# Patient Record
Sex: Female | Born: 1980 | Race: Black or African American | Hispanic: No | Marital: Single | Smoking: Current every day smoker
Health system: Southern US, Community
[De-identification: ages and names within clinical notes are randomized; demographics above are authoritative.]

## PROBLEM LIST (undated history)

## (undated) DIAGNOSIS — M5430 Sciatica, unspecified side: Secondary | ICD-10-CM

## (undated) DIAGNOSIS — F4310 Post-traumatic stress disorder, unspecified: Secondary | ICD-10-CM

## (undated) DIAGNOSIS — J45909 Unspecified asthma, uncomplicated: Secondary | ICD-10-CM

## (undated) DIAGNOSIS — F32A Depression, unspecified: Secondary | ICD-10-CM

## (undated) HISTORY — DX: Depression, unspecified: F32.A

## (undated) HISTORY — DX: Post-traumatic stress disorder, unspecified: F43.10

---
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
## 2009-06-12 DIAGNOSIS — M5430 Sciatica, unspecified side: Secondary | ICD-10-CM

## 2009-06-12 HISTORY — DX: Sciatica, unspecified side: M54.30

## 2011-02-23 ENCOUNTER — Emergency Department (HOSPITAL_COMMUNITY)
Admission: EM | Admit: 2011-02-23 | Discharge: 2011-02-23 | Disposition: A | Discharge status: Home / Self Care | Payer: Medicaid Other | Attending: Emergency Medicine | Admitting: Emergency Medicine

## 2011-02-23 DIAGNOSIS — R51 Headache: Secondary | ICD-10-CM | POA: Insufficient documentation

## 2012-04-25 ENCOUNTER — Emergency Department (HOSPITAL_COMMUNITY)
Admission: EM | Admit: 2012-04-25 | Discharge: 2012-04-25 | Disposition: A | Discharge status: Home / Self Care | Payer: Medicaid Other | Attending: Emergency Medicine | Admitting: Emergency Medicine

## 2012-04-25 DIAGNOSIS — K089 Disorder of teeth and supporting structures, unspecified: Secondary | ICD-10-CM | POA: Insufficient documentation

## 2012-04-25 DIAGNOSIS — Z79899 Other long term (current) drug therapy: Secondary | ICD-10-CM | POA: Insufficient documentation

## 2012-04-25 DIAGNOSIS — H6090 Unspecified otitis externa, unspecified ear: Secondary | ICD-10-CM

## 2012-04-25 DIAGNOSIS — H60399 Other infective otitis externa, unspecified ear: Secondary | ICD-10-CM | POA: Insufficient documentation

## 2012-04-25 MED ORDER — ONDANSETRON 4 MG PO TBDP
4.0000 mg | ORAL_TABLET | Freq: Once | ORAL | Status: AC
  Administered 2012-04-25: 4 mg via ORAL
  Filled 2012-04-25: qty 1

## 2012-04-25 MED ORDER — NEOMYCIN-POLYMYXIN-HC 3.5-10000-1 OT SUSP: 4.0000 [drp] | Freq: Four times a day (QID) | OTIC | Status: DC

## 2012-04-25 MED ORDER — KETOROLAC TROMETHAMINE 60 MG/2ML IM SOLN
60.0000 mg | Freq: Once | INTRAMUSCULAR | Status: AC
  Administered 2012-04-25: 60 mg via INTRAMUSCULAR
  Filled 2012-04-25: qty 2

## 2012-04-25 MED ORDER — OXYCODONE-ACETAMINOPHEN 5-325 MG PO TABS: 1.0000 | ORAL_TABLET | Freq: Four times a day (QID) | ORAL | Status: DC | PRN

## 2012-04-25 MED ORDER — OXYCODONE-ACETAMINOPHEN 5-325 MG PO TABS
2.0000 | ORAL_TABLET | Freq: Once | ORAL | Status: AC
  Administered 2012-04-25: 2 via ORAL
  Filled 2012-04-25: qty 2

## 2012-04-25 NOTE — ED Provider Notes (Signed)
History     CSN: [id number]  Arrival date & time 04/25/12  1059   First MD Initiated Contact with Patient 04/25/12 1107      Chief Complaint  Patient presents with  . Otalgia  . Dental Pain    (Consider location/radiation/quality/duration/timing/severity/associated sxs/prior treatment) HPI Comments: Patient presents with left ear pain X 2 days. She states that the pain is 12/10 with radation to her jaw. Reports having cavities but no dental pain. Denies fever or chills. Denies tinnitus or ear drainage.   The history is provided by the patient. No language interpreter was used.    No past medical history on file.  No past surgical history on file.  No family history on file.  History  Substance Use Topics  . Smoking status: Not on file  . Smokeless tobacco: Not on file  . Alcohol Use: Not on file    OB History    No data available      Review of Systems  Constitutional: Negative for fever and chills.  HENT: Positive for ear pain. Negative for tinnitus and ear discharge.     Allergies  Review of patient's allergies indicates no known allergies.  Home Medications   Current Outpatient Rx  Name  Route  Sig  Dispense  Refill  . DIVALPROEX SODIUM ER 500 MG PO TB24   Oral   Take 500-1,000 mg by mouth daily. Take 1 tablet (500 mg) in the morning and two tablets (1,000 mg total) at night         . FLUOXETINE HCL 40 MG PO CAPS   Oral   Take 40 mg by mouth daily.         Marland Kitchen HYDROXYZINE HCL 25 MG PO TABS   Oral   Take 25 mg by mouth 3 (three) times daily as needed. Anxiety         . NEOMYCIN-POLYMYXIN-HC 3.5-10000-1 OT SUSP   Otic   Place 4 drops in ear(s) 4 (four) times daily. X 7 days   10 mL   0   . OXYCODONE-ACETAMINOPHEN 5-325 MG PO TABS   Oral   Take 1 tablet by mouth every 6 (six) hours as needed for pain.   10 tablet   0     BP 129/72  Pulse 99  Temp 97.8 F (36.6 C) (Oral)  Resp 18  SpO2 100%  Physical Exam  Nursing note and  vitals reviewed. Constitutional: She appears well-developed and well-nourished.  HENT:  Head: Normocephalic and atraumatic.  Right Ear: External ear normal.  Mouth/Throat: Oropharynx is clear and moist.       Mild swelling of left ear canal. Tenderness to palpation of the tragus.  Eyes: Conjunctivae normal and EOM are normal. No scleral icterus.  Neck: Normal range of motion. Neck supple.  Cardiovascular: Normal rate, regular rhythm and normal heart sounds.   Pulmonary/Chest: Effort normal and breath sounds normal.  Abdominal: Soft. Bowel sounds are normal. There is no tenderness.  Neurological: She is alert.  Skin: Skin is warm and dry.    ED Course  Procedures (including critical care time)  Labs Reviewed - No data to display No results found.   1. Otitis externa       MDM  Patient presented with symptoms consistent with otitis externa. Patient given pain medication in ED with improvement. Discharged with otic drops and short course of pain meds. Return precautions given. No red flags for TM perforation,  Pixie Casino, PA-C 04/25/12 1306

## 2012-04-25 NOTE — ED Notes (Signed)
Left side ear/jaw pain.  Pt not sure if it's her ear or her teeth.  Per pt, she has several teeth that have cavities but also feels some congestion in her ear.

## 2012-04-25 NOTE — ED Notes (Signed)
[id number]<AV> Expected date:<BR> Expected time:<BR> Means of arrival:<BR> Comments:<BR> Crystal Koch

## 2012-04-26 NOTE — ED Provider Notes (Signed)
Medical screening examination/treatment/procedure(s) were performed by non-physician practitioner and as supervising physician I was immediately available for consultation/collaboration.   Gwyneth Sprout, MD 04/26/12 [phone]

## 2013-08-26 ENCOUNTER — Encounter (HOSPITAL_COMMUNITY): Payer: Self-pay | Admitting: Emergency Medicine

## 2013-08-26 ENCOUNTER — Emergency Department (HOSPITAL_COMMUNITY)
Admission: EM | Admit: 2013-08-26 | Discharge: 2013-08-26 | Disposition: A | Discharge status: Home / Self Care | Payer: Medicaid Other | Attending: Emergency Medicine | Admitting: Emergency Medicine

## 2013-08-26 DIAGNOSIS — F17200 Nicotine dependence, unspecified, uncomplicated: Secondary | ICD-10-CM | POA: Insufficient documentation

## 2013-08-26 DIAGNOSIS — J45909 Unspecified asthma, uncomplicated: Secondary | ICD-10-CM | POA: Insufficient documentation

## 2013-08-26 DIAGNOSIS — J029 Acute pharyngitis, unspecified: Secondary | ICD-10-CM | POA: Insufficient documentation

## 2013-08-26 DIAGNOSIS — B9789 Other viral agents as the cause of diseases classified elsewhere: Secondary | ICD-10-CM | POA: Insufficient documentation

## 2013-08-26 DIAGNOSIS — B349 Viral infection, unspecified: Secondary | ICD-10-CM

## 2013-08-26 HISTORY — DX: Unspecified asthma, uncomplicated: J45.909

## 2013-08-26 MED ORDER — BENZONATATE 100 MG PO CAPS: 100.0000 mg | ORAL_CAPSULE | Freq: Three times a day (TID) | ORAL | Status: DC

## 2013-08-26 MED ORDER — ONDANSETRON 4 MG PO TBDP
4.0000 mg | ORAL_TABLET | Freq: Once | ORAL | Status: AC
  Administered 2013-08-26: 4 mg via ORAL
  Filled 2013-08-26: qty 1

## 2013-08-26 MED ORDER — ACETAMINOPHEN 325 MG PO TABS
650.0000 mg | ORAL_TABLET | Freq: Once | ORAL | Status: AC
  Administered 2013-08-26: 650 mg via ORAL
  Filled 2013-08-26: qty 2

## 2013-08-26 MED ORDER — KETOROLAC TROMETHAMINE 60 MG/2ML IM SOLN
60.0000 mg | Freq: Once | INTRAMUSCULAR | Status: AC
  Administered 2013-08-26: 60 mg via INTRAMUSCULAR
  Filled 2013-08-26: qty 2

## 2013-08-26 MED ORDER — IBUPROFEN 600 MG PO TABS: 600.0000 mg | ORAL_TABLET | Freq: Four times a day (QID) | ORAL | Status: DC | PRN

## 2013-08-26 MED ORDER — ONDANSETRON HCL 4 MG/2ML IJ SOLN: 4.0000 mg | Freq: Once | INTRAMUSCULAR | Status: DC

## 2013-08-26 NOTE — ED Notes (Signed)
Pt states she has a sore throat that started yesterday, headache with body aches. 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 Pt alert and oriented.

## 2013-08-26 NOTE — ED Notes (Signed)
Per EMS: Pt c/o sore throat, non productive cough and body aches. No n/v/d. VS stable. Pt ambulatory to room.

## 2013-08-26 NOTE — Discharge Instructions (Signed)
Please read and follow all provided instructions.  Your diagnoses today include:  1. Viral syndrome    You appear to have an upper respiratory infection (URI). An upper respiratory tract infection, or cold, is a viral infection of the air passages leading to the lungs. It should improve gradually after 5-7 days. You may have a lingering cough that lasts for 2- 4 weeks after the infection.  Tests performed today include:  Vital signs. See below for your results today.   Medications prescribed:   Ibuprofen (Motrin, Advil) - anti-inflammatory pain medication  Do not exceed 600mg  ibuprofen every 6 hours, take with food  You have been prescribed an anti-inflammatory medication or NSAID. Take with food. Take smallest effective dose for the shortest duration needed for your pain. Stop taking if you experience stomach pain or vomiting.    Tessalon Perles - cough suppressant medication  Take any prescribed medications only as directed. Treatment for your infection is aimed at treating the symptoms. There are no medications, such as antibiotics, that will cure your infection.   Home care instructions:  Follow any educational materials contained in this packet.   Your illness is contagious and can be spread to others, especially during the first 3 or 4 days. It cannot be cured by antibiotics or other medicines. Take basic precautions such as washing your hands often, covering your mouth when you cough or sneeze, and avoiding public places where you could spread your illness to others.   Please continue drinking plenty of fluids.  Use over-the-counter medicines as needed as directed on packaging for symptom relief.  You may also use ibuprofen or tylenol as directed on packaging for pain or fever.  Do not take multiple medicines containing Tylenol or acetaminophen to avoid taking too much of this medication.  Follow-up instructions: Please follow-up with your primary care provider in the next 3  days for further evaluation of your symptoms if you are not feeling better. If you do not have a primary care doctor -- see below for referral information.   Return instructions:   Please return to the Emergency Department if you experience worsening symptoms.   RETURN IMMEDIATELY IF you develop shortness of breath, confusion or altered mental status, a new rash, become dizzy, faint, or poorly responsive, or are unable to be cared for at home.  Please return if you have persistent vomiting and cannot keep down fluids or develop a fever that is not controlled by tylenol or motrin.    Please return if you have any other emergent concerns.  Additional Information:  Your vital signs today were: BP 128/95   Pulse 102   Temp(Src) 100.3 F (37.9 C) (Oral)   Resp 20   SpO2 98%   LMP 08/16/2013 If your blood pressure (BP) was elevated above 135/85 this visit, please have this repeated by your doctor within one month. --------------

## 2013-08-26 NOTE — ED Provider Notes (Signed)
Medical screening examination/treatment/procedure(s) were performed by non-physician practitioner and as supervising physician I was immediately available for consultation/collaboration.   EKG Interpretation None        Dagmar HaitWilliam Jaymie Mckiddy, MD 08/26/13 [phone]

## 2013-08-26 NOTE — ED Notes (Signed)
Bed: WA14 Expected date:  Expected time:  Means of arrival:  Comments: EMS 

## 2013-08-26 NOTE — ED Provider Notes (Signed)
CSN: [id number]     Arrival date & time 08/26/13  [id number] History   First MD Initiated Contact with Patient 08/26/13 0645     Chief Complaint  Patient presents with  . Sore Throat     (Consider location/radiation/quality/duration/timing/severity/associated sxs/prior Treatment) HPI Comments: Patient presents with complaint of sore throat and cough beginning yesterday. She also complains of myalgias. Subjective fever, measured to be 100.77F here. Patient has taken Alka-Seltzer cold and flu medication. She states that she had the flu earlier this year. No known sick contacts. No chronic medical issues. Patient is a smoker. She denies ear pain, runny nose. No nausea, vomiting, or diarrhea. No urinary symptoms. The onset of this condition was acute. The course is constant. Aggravating factors: none. Alleviating factors: none.    Patient is a 33 y.o. female presenting with pharyngitis. The history is provided by the patient.  Sore Throat Associated symptoms include coughing, fatigue, a fever and a sore throat. Pertinent negatives include no abdominal pain, chills, congestion, headaches, myalgias, nausea, rash or vomiting.    Past Medical History  Diagnosis Date  . Asthma    History reviewed. No pertinent past surgical history. History reviewed. No pertinent family history. History  Substance Use Topics  . Smoking status: Current Every Day Smoker -- 0.50 packs/day  . Smokeless tobacco: Not on file  . Alcohol Use: No   OB History   Grav Para Term Preterm Abortions TAB SAB Ect Mult Living                 Review of Systems  Constitutional: Positive for fever and fatigue. Negative for chills.  HENT: Positive for sore throat. Negative for congestion, ear pain, rhinorrhea and sinus pressure.   Eyes: Negative for redness.  Respiratory: Positive for cough. Negative for wheezing.   Gastrointestinal: Negative for nausea, vomiting, abdominal pain and diarrhea.  Genitourinary: Negative for  dysuria.  Musculoskeletal: Negative for myalgias and neck stiffness.  Skin: Negative for rash.  Neurological: Negative for headaches.  Hematological: Negative for adenopathy.      Allergies  Review of patient's allergies indicates no known allergies.  Home Medications   Current Outpatient Rx  Name  Route  Sig  Dispense  Refill  . sodium-potassium bicarbonate (ALKA-SELTZER GOLD) TBEF dissolvable tablet   Oral   Take 1 tablet by mouth daily as needed.          BP 128/95  Pulse 102  Temp(Src) 100.3 F (37.9 C) (Oral)  Resp 20  SpO2 98%  LMP 08/16/2013 Physical Exam  Nursing note and vitals reviewed. Constitutional: She appears well-developed and well-nourished.  HENT:  Head: Normocephalic and atraumatic.  Right Ear: Tympanic membrane, external ear and ear canal normal.  Left Ear: Tympanic membrane, external ear and ear canal normal.  Nose: Nose normal. No mucosal edema or rhinorrhea.  Mouth/Throat: Uvula is midline and mucous membranes are normal. Mucous membranes are not dry. No oral lesions. No trismus in the jaw. No uvula swelling. Posterior oropharyngeal erythema present. No oropharyngeal exudate, posterior oropharyngeal edema or tonsillar abscesses.  Eyes: Conjunctivae are normal. Right eye exhibits no discharge. Left eye exhibits no discharge.  Neck: Normal range of motion. Neck supple.  Cardiovascular: Normal rate, regular rhythm and normal heart sounds.   Pulmonary/Chest: Effort normal and breath sounds normal. No respiratory distress. She has no wheezes. She has no rales.  Abdominal: Soft. There is no tenderness.  Lymphadenopathy:    She has no cervical adenopathy.  Neurological: She is alert.  Skin: Skin is warm and dry.  Psychiatric: She has a normal mood and affect.    ED Course  Procedures (including critical care time) Labs Review Labs Reviewed - No data to display Imaging Review No results found.   EKG Interpretation None      7:03 AM Patient  seen and examined. Medications ordered.   Vital signs reviewed and are as follows: Filed Vitals:   08/26/13 0647  BP: 128/95  Pulse: 102  Temp: 100.3 F (37.9 C)  Resp: 20   Patient counseled on supportive care for viral URI and s/s to return including worsening symptoms, persistent fever, persistent vomiting, or if they have any other concerns. Urged to see PCP if symptoms persist for more than 3 days. Patient verbalizes understanding and agrees with plan.   MDM   Final diagnoses:  Viral syndrome   Patient with URI symptoms, myalgias. No chronic medical problems. Vitals normal. She appears well, non-toxic. Sx less than 24 hrs. Conservative management indicated. CENTOR 1/4 (fever). Doubt PNA given clear lungs, sore throat indicative of viral syndrome.     Renne Crigler, PA-C 08/26/13 [phone]

## 2014-05-28 ENCOUNTER — Emergency Department (HOSPITAL_COMMUNITY)
Admission: EM | Admit: 2014-05-28 | Discharge: 2014-05-28 | Disposition: A | Discharge status: Home / Self Care | Payer: Medicaid Other | Attending: Emergency Medicine | Admitting: Emergency Medicine

## 2014-05-28 ENCOUNTER — Encounter (HOSPITAL_COMMUNITY): Payer: Self-pay | Admitting: *Deleted

## 2014-05-28 DIAGNOSIS — Z7952 Long term (current) use of systemic steroids: Secondary | ICD-10-CM | POA: Insufficient documentation

## 2014-05-28 DIAGNOSIS — J45909 Unspecified asthma, uncomplicated: Secondary | ICD-10-CM | POA: Insufficient documentation

## 2014-05-28 DIAGNOSIS — M5442 Lumbago with sciatica, left side: Secondary | ICD-10-CM | POA: Diagnosis not present

## 2014-05-28 DIAGNOSIS — Z720 Tobacco use: Secondary | ICD-10-CM | POA: Insufficient documentation

## 2014-05-28 DIAGNOSIS — Z7982 Long term (current) use of aspirin: Secondary | ICD-10-CM | POA: Insufficient documentation

## 2014-05-28 DIAGNOSIS — M5441 Lumbago with sciatica, right side: Secondary | ICD-10-CM | POA: Insufficient documentation

## 2014-05-28 DIAGNOSIS — M545 Low back pain: Secondary | ICD-10-CM | POA: Diagnosis present

## 2014-05-28 DIAGNOSIS — M5432 Sciatica, left side: Secondary | ICD-10-CM

## 2014-05-28 DIAGNOSIS — M5431 Sciatica, right side: Secondary | ICD-10-CM

## 2014-05-28 MED ORDER — IBUPROFEN 800 MG PO TABS: 800.0000 mg | ORAL_TABLET | Freq: Three times a day (TID) | ORAL | Status: DC

## 2014-05-28 MED ORDER — METHOCARBAMOL 500 MG PO TABS: 500.0000 mg | ORAL_TABLET | Freq: Two times a day (BID) | ORAL | Status: DC

## 2014-05-28 MED ORDER — METHOCARBAMOL 500 MG PO TABS
500.0000 mg | ORAL_TABLET | Freq: Once | ORAL | Status: AC
  Administered 2014-05-28: 500 mg via ORAL
  Filled 2014-05-28: qty 1

## 2014-05-28 MED ORDER — PREDNISONE 10 MG PO TABS: ORAL_TABLET | ORAL | Status: DC

## 2014-05-28 MED ORDER — HYDROCODONE-ACETAMINOPHEN 5-325 MG PO TABS
2.0000 | ORAL_TABLET | Freq: Once | ORAL | Status: AC
  Administered 2014-05-28: 2 via ORAL
  Filled 2014-05-28: qty 2

## 2014-05-28 MED ORDER — HYDROCODONE-ACETAMINOPHEN 5-325 MG PO TABS: 1.0000 | ORAL_TABLET | Freq: Four times a day (QID) | ORAL | Status: DC | PRN

## 2014-05-28 NOTE — ED Provider Notes (Signed)
CSN: [id number]     Arrival date & time 05/28/14  1415 History   First MD Initiated Contact with Patient 05/28/14 1506     Chief Complaint  Patient presents with  . Back Pain     (Consider location/radiation/quality/duration/timing/severity/associated sxs/prior Treatment) The history is provided by medical records and the patient. No language interpreter was used.     Crystal Koch is a 33 y.o. female  with a hx of asthma presents to the Emergency Department complaining of gradual, persistent, progressively worsening low back pain, worse on the left onset 2 weeks ago; initially waxing and waning throughout the day, but constant today.  Pt reports she has low back pain since the birth of her son in 2011 and she reports this only occurs in the winter when it gets cold.  Pt denies known falls, injury or trauma.  No hx of back surgeries.  Pt denies hx of blood thinners, cancer, IVDU.  Pt reports taking 800mg  ibuprofen without relief.  Associated symptoms include radiation of the pain down the posterior of both legs with paresthesias of the left leg.  Hot baths makes it better and walking, moving makes it worse.  Pt denies fever, chills, headache, neck pain, chest pain, SOB, abd pain, N/V/D, weakness, dizziness, syncope, loss of bowel or bladder control, saddle anesthesia, gait disturbance.    Past Medical History  Diagnosis Date  . Asthma    History reviewed. No pertinent past surgical history. No family history on file. History  Substance Use Topics  . Smoking status: Current Every Day Smoker -- 0.50 packs/day  . Smokeless tobacco: Not on file  . Alcohol Use: No   OB History    No data available     Review of Systems  Constitutional: Negative for fever and fatigue.  Respiratory: Negative for chest tightness and shortness of breath.   Cardiovascular: Negative for chest pain.  Gastrointestinal: Negative for nausea, vomiting, abdominal pain and diarrhea.  Genitourinary: Negative for  dysuria, urgency, frequency and hematuria.  Musculoskeletal: Positive for back pain. Negative for joint swelling, gait problem, neck pain and neck stiffness.  Skin: Negative for rash.  Neurological: Negative for weakness, light-headedness, numbness and headaches.  All other systems reviewed and are negative.     Allergies  Review of patient's allergies indicates no known allergies.  Home Medications   Prior to Admission medications   Medication Sig Start Date End Date Taking? Authorizing Provider  aspirin 325 MG tablet Take 325 mg by mouth 2 (two) times daily as needed for mild pain or moderate pain.   Yes Historical Provider, MD  benzonatate (TESSALON) 100 MG capsule Take 1 capsule (100 mg total) by mouth every 8 (eight) hours. Patient not taking: Reported on 05/28/2014 08/26/13   Renne CriglerJoshua Geiple, PA-C  HYDROcodone-acetaminophen (NORCO/VICODIN) 5-325 MG per tablet Take 1-2 tablets by mouth every 6 (six) hours as needed for moderate pain or severe pain. 05/28/14   Jie Stickels, PA-C  ibuprofen (ADVIL,MOTRIN) 800 MG tablet Take 1 tablet (800 mg total) by mouth 3 (three) times daily. 05/28/14   Umer Harig, PA-C  methocarbamol (ROBAXIN) 500 MG tablet Take 1 tablet (500 mg total) by mouth 2 (two) times daily. 05/28/14   Addaleigh Nicholls, PA-C  predniSONE (DELTASONE) 10 MG tablet 4 tabs po daily x 3 days, then 3 tabs x 3 days, then 2 tabs x 3 days, then 1 tab x 3 days, then 0.5 tabs x 4 days 05/28/14   Dahlia ClientHannah Newel Oien, PA-C   BP  118/69 mmHg  Pulse 94  Resp 20  SpO2 99% Physical Exam  Constitutional: She appears well-developed and well-nourished. No distress.  HENT:  Head: Normocephalic and atraumatic.  Mouth/Throat: Oropharynx is clear and moist. No oropharyngeal exudate.  Eyes: Conjunctivae are normal.  Neck: Normal range of motion. Neck supple.  Full ROM without pain  Cardiovascular: Normal rate, regular rhythm, normal heart sounds and intact distal pulses.   No  murmur heard. Pulmonary/Chest: Effort normal and breath sounds normal. No respiratory distress. She has no wheezes.  Abdominal: Soft. She exhibits no distension. There is no tenderness.  Musculoskeletal:  Full range of motion of the T-spine and L-spine No tenderness to palpation of the spinous processes of the T-spine or L-spine Mild tenderness to palpation of the paraspinous muscles of the L-spine Tenderness to palpation of the bilateral SI joints Tenderness to the left buttock with reproducible pain radiation and paresthesias with this  Lymphadenopathy:    She has no cervical adenopathy.  Neurological: She is alert. She has normal reflexes.  Reflex Scores:      Bicep reflexes are 2+ on the right side and 2+ on the left side.      Brachioradialis reflexes are 2+ on the right side and 2+ on the left side.      Patellar reflexes are 2+ on the right side and 2+ on the left side.      Achilles reflexes are 2+ on the right side and 2+ on the left side. Speech is clear and goal oriented, follows commands Normal 5/5 strength in upper and lower extremities bilaterally including dorsiflexion and plantar flexion, strong and equal grip strength Sensation normal to light and sharp touch Moves extremities without ataxia, coordination intact antalgic gait Normal balance No Clonus   Skin: Skin is warm and dry. No rash noted. She is not diaphoretic. No erythema.  Psychiatric: She has a normal mood and affect. Her behavior is normal.  Nursing note and vitals reviewed.   ED Course  Procedures (including critical care time) Labs Review Labs Reviewed - No data to display  Imaging Review No results found.   EKG Interpretation None      MDM   Final diagnoses:  Bilateral low back pain with sciatica, sciatica laterality unspecified  Bilateral sciatica    Crystal Koch presents with atraumatic back pain.  No neurological deficits and normal neuro exam.  Patient can walk but states is  painful.  No loss of bowel or bladder control.  No concern for cauda equina.  No fever, night sweats, weight loss, h/o cancer, IVDU.  RICE protocol and pain medicine indicated and discussed with patient.   I have personally reviewed patient's vitals, nursing note and any pertinent labs or imaging.  I performed an focused physical exam; undressed when appropriate .    It has been determined that no acute conditions requiring further emergency intervention are present at this time. The patient/guardian have been advised of the diagnosis and plan. I reviewed any labs and imaging including any potential incidental findings. We have discussed signs and symptoms that warrant return to the ED and they are listed in the discharge instructions.    Vital signs are stable at discharge.   BP 118/69 mmHg  Pulse 94  Resp 20  SpO2 99%         Dierdre ForthHannah Nijah Orlich, PA-C 05/28/14 1559  Ethelda ChickMartha K Linker, MD 05/28/14 [phone]

## 2014-05-28 NOTE — ED Notes (Signed)
Per EMS - patient comes from home with bilateral low back pain that is acute on chronic.  Patient had an epidural in 03/2010 and has had low back pain since that time.  Patient states pain became worse last week.  Vitals WNL 116/62, 108/68.  Pain 10/10.  HR 96-100, RR 16 - 18.

## 2014-05-28 NOTE — ED Notes (Signed)
Bed: WA11 Expected date:  Expected time:  Means of arrival:  Comments: EMS 

## 2014-05-28 NOTE — Discharge Instructions (Signed)
1. Medications: robaxin, naproxyn, vicodin, usual home medications 2. Treatment: rest, drink plenty of fluids, gentle stretching as discussed, alternate ice and heat 3. Follow Up: Please followup with your primary doctor in 3 days for discussion of your diagnoses and further evaluation after today's visit; if you do not have a primary care doctor use the resource guide provided to find one;  Return to the ER for worsening back pain, difficulty walking, loss of bowel or bladder control or other concerning symptoms     Back Exercises Back exercises help treat and prevent back injuries. The goal of back exercises is to increase the strength of your abdominal and back muscles and the flexibility of your back. These exercises should be started when you no longer have back pain. Back exercises include:  Pelvic Tilt. Lie on your back with your knees bent. Tilt your pelvis until the lower part of your back is against the floor. Hold this position 5 to 10 sec and repeat 5 to 10 times.  Knee to Chest. Pull first 1 knee up against your chest and hold for 20 to 30 seconds, repeat this with the other knee, and then both knees. This may be done with the other leg straight or bent, whichever feels better.  Sit-Ups or Curl-Ups. Bend your knees 90 degrees. Start with tilting your pelvis, and do a partial, slow sit-up, lifting your trunk only 30 to 45 degrees off the floor. Take at least 2 to 3 seconds for each sit-up. Do not do sit-ups with your knees out straight. If partial sit-ups are difficult, simply do the above but with only tightening your abdominal muscles and holding it as directed.  Hip-Lift. Lie on your back with your knees flexed 90 degrees. Push down with your feet and shoulders as you raise your hips a couple inches off the floor; hold for 10 seconds, repeat 5 to 10 times.  Back arches. Lie on your stomach, propping yourself up on bent elbows. Slowly press on your hands, causing an arch in your low  back. Repeat 3 to 5 times. Any initial stiffness and discomfort should lessen with repetition over time.  Shoulder-Lifts. Lie face down with arms beside your body. Keep hips and torso pressed to floor as you slowly lift your head and shoulders off the floor. Do not overdo your exercises, especially in the beginning. Exercises may cause you some mild back discomfort which lasts for a few minutes; however, if the pain is more severe, or lasts for more than 15 minutes, do not continue exercises until you see your caregiver. Improvement with exercise therapy for back problems is slow.  See your caregivers for assistance with developing a proper back exercise program. Document Released: 07/06/2004 Document Revised: 08/21/2011 Document Reviewed: 03/30/2011 [facility] Patient Information 2015 [place], [place]. This information is not intended to replace advice given to you by your health care provider. Make sure you discuss any questions you have with your health care provider.   Sciatica Sciatica is pain, weakness, numbness, or tingling along the path of the sciatic nerve. The nerve starts in the lower back and runs down the back of each leg. The nerve controls the muscles in the lower leg and in the back of the knee, while also providing sensation to the back of the thigh, lower leg, and the sole of your foot. Sciatica is a symptom of another medical condition. For instance, nerve damage or certain conditions, such as a herniated disk or bone spur on the spine,  pinch or put pressure on the sciatic nerve. This causes the pain, weakness, or other sensations normally associated with sciatica. Generally, sciatica only affects one side of the body. CAUSES   Herniated or slipped disc.  Degenerative disk disease.  A pain disorder involving the narrow muscle in the buttocks (piriformis syndrome).  Pelvic injury or fracture.  Pregnancy.  Tumor (rare). SYMPTOMS  Symptoms can vary from mild to very severe. The  symptoms usually travel from the low back to the buttocks and down the back of the leg. Symptoms can include:  Mild tingling or dull aches in the lower back, leg, or hip.  Numbness in the back of the calf or sole of the foot.  Burning sensations in the lower back, leg, or hip.  Sharp pains in the lower back, leg, or hip.  Leg weakness.  Severe back pain inhibiting movement. These symptoms may get worse with coughing, sneezing, laughing, or prolonged sitting or standing. Also, being overweight may worsen symptoms. DIAGNOSIS  Your caregiver will perform a physical exam to look for common symptoms of sciatica. He or she may ask you to do certain movements or activities that would trigger sciatic nerve pain. Other tests may be performed to find the cause of the sciatica. These may include:  Blood tests.  X-rays.  Imaging tests, such as an MRI or CT scan. TREATMENT  Treatment is directed at the cause of the sciatic pain. Sometimes, treatment is not necessary and the pain and discomfort goes away on its own. If treatment is needed, your caregiver may suggest:  Over-the-counter medicines to relieve pain.  Prescription medicines, such as anti-inflammatory medicine, muscle relaxants, or narcotics.  Applying heat or ice to the painful area.  Steroid injections to lessen pain, irritation, and inflammation around the nerve.  Reducing activity during periods of pain.  Exercising and stretching to strengthen your abdomen and improve flexibility of your spine. Your caregiver may suggest losing weight if the extra weight makes the back pain worse.  Physical therapy.  Surgery to eliminate what is pressing or pinching the nerve, such as a bone spur or part of a herniated disk. HOME CARE INSTRUCTIONS   Only take over-the-counter or prescription medicines for pain or discomfort as directed by your caregiver.  Apply ice to the affected area for 20 minutes, 3-4 times a day for the first 48-72  hours. Then try heat in the same way.  Exercise, stretch, or perform your usual activities if these do not aggravate your pain.  Attend physical therapy sessions as directed by your caregiver.  Keep all follow-up appointments as directed by your caregiver.  Do not wear high heels or shoes that do not provide proper support.  Check your mattress to see if it is too soft. A firm mattress may lessen your pain and discomfort. SEEK IMMEDIATE MEDICAL CARE IF:   You lose control of your bowel or bladder (incontinence).  You have increasing weakness in the lower back, pelvis, buttocks, or legs.  You have redness or swelling of your back.  You have a burning sensation when you urinate.  You have pain that gets worse when you lie down or awakens you at night.  Your pain is worse than you have experienced in the past.  Your pain is lasting longer than 4 weeks.  You are suddenly losing weight without reason. MAKE SURE YOU:  Understand these instructions.  Will watch your condition.  Will get help right away if you are not doing well  or get worse. Document Released: 05/23/2001 Document Revised: 11/28/2011 Document Reviewed: 10/08/2011 [facility] Patient Information 2015 [place]. This information is not intended to replace advice given to you by your health care provider. Make sure you discuss any questions you have with your health care provider.

## 2014-06-04 ENCOUNTER — Encounter (HOSPITAL_COMMUNITY): Payer: Self-pay

## 2014-06-04 ENCOUNTER — Emergency Department (HOSPITAL_COMMUNITY)
Admission: EM | Admit: 2014-06-04 | Discharge: 2014-06-04 | Disposition: A | Discharge status: Home / Self Care | Payer: Medicaid Other | Attending: Emergency Medicine | Admitting: Emergency Medicine

## 2014-06-04 DIAGNOSIS — M5431 Sciatica, right side: Secondary | ICD-10-CM | POA: Diagnosis not present

## 2014-06-04 DIAGNOSIS — Z7982 Long term (current) use of aspirin: Secondary | ICD-10-CM | POA: Diagnosis not present

## 2014-06-04 DIAGNOSIS — Z79899 Other long term (current) drug therapy: Secondary | ICD-10-CM | POA: Insufficient documentation

## 2014-06-04 DIAGNOSIS — M549 Dorsalgia, unspecified: Secondary | ICD-10-CM | POA: Diagnosis present

## 2014-06-04 DIAGNOSIS — M5432 Sciatica, left side: Secondary | ICD-10-CM | POA: Insufficient documentation

## 2014-06-04 DIAGNOSIS — Z720 Tobacco use: Secondary | ICD-10-CM | POA: Diagnosis not present

## 2014-06-04 DIAGNOSIS — Z7952 Long term (current) use of systemic steroids: Secondary | ICD-10-CM | POA: Insufficient documentation

## 2014-06-04 DIAGNOSIS — J45909 Unspecified asthma, uncomplicated: Secondary | ICD-10-CM | POA: Diagnosis not present

## 2014-06-04 MED ORDER — HYDROCODONE-ACETAMINOPHEN 5-325 MG PO TABS: 1.0000 | ORAL_TABLET | Freq: Four times a day (QID) | ORAL | Status: DC | PRN

## 2014-06-04 MED ORDER — HYDROCODONE-ACETAMINOPHEN 5-325 MG PO TABS
2.0000 | ORAL_TABLET | Freq: Once | ORAL | Status: AC
  Administered 2014-06-04: 2 via ORAL
  Filled 2014-06-04: qty 2

## 2014-06-04 MED ORDER — METHOCARBAMOL 500 MG PO TABS: 500.0000 mg | ORAL_TABLET | Freq: Two times a day (BID) | ORAL | Status: DC

## 2014-06-04 NOTE — ED Provider Notes (Signed)
CSN: [id number]     Arrival date & time 06/04/14  [id number] History   First MD Initiated Contact with Patient 06/04/14 0516     Chief Complaint  Patient presents with  . Back Pain     (Consider location/radiation/quality/duration/timing/severity/associated sxs/prior Treatment) HPI  This is a 33 year old female with a history of sciatica. She states her sciatica "acts up" about this time of year for the past 4 years. She was seen in the ED on the 17th of this month for an exacerbation of her sciatica. She was discharged on Robaxin, Vicodin, ibuprofen and a steroid taper. She is now out of her Robaxin and Vicodin and is experiencing an exacerbation of her pain. She rates it as 7 out of 10 at the present time. It is principally on the left side but there is also a right-sided component. The pain radiates down the back of her legs. Pain is worse when lying still and improved when she is up and active. She is having some paresthesias of the left leg but no frank numbness. She denies weakness or bowel or bladder changes.   Past Medical History  Diagnosis Date  . Asthma    No past surgical history on file. No family history on file. History  Substance Use Topics  . Smoking status: Current Every Day Smoker -- 0.50 packs/day  . Smokeless tobacco: Not on file  . Alcohol Use: No   OB History    No data available     Review of Systems  All other systems reviewed and are negative.   Allergies  Review of patient's allergies indicates no known allergies.  Home Medications   Prior to Admission medications   Medication Sig Start Date End Date Taking? Authorizing Provider  aspirin 325 MG tablet Take 325 mg by mouth 2 (two) times daily as needed for mild pain or moderate pain.    Historical Provider, MD  benzonatate (TESSALON) 100 MG capsule Take 1 capsule (100 mg total) by mouth every 8 (eight) hours. Patient not taking: Reported on 05/28/2014 08/26/13   Renne CriglerJoshua Geiple, PA-C   HYDROcodone-acetaminophen (NORCO/VICODIN) 5-325 MG per tablet Take 1-2 tablets by mouth every 6 (six) hours as needed for moderate pain or severe pain. 05/28/14   Hannah Muthersbaugh, PA-C  ibuprofen (ADVIL,MOTRIN) 800 MG tablet Take 1 tablet (800 mg total) by mouth 3 (three) times daily. 05/28/14   Hannah Muthersbaugh, PA-C  methocarbamol (ROBAXIN) 500 MG tablet Take 1 tablet (500 mg total) by mouth 2 (two) times daily. 05/28/14   Hannah Muthersbaugh, PA-C  predniSONE (DELTASONE) 10 MG tablet 4 tabs po daily x 3 days, then 3 tabs x 3 days, then 2 tabs x 3 days, then 1 tab x 3 days, then 0.5 tabs x 4 days 05/28/14   Dahlia ClientHannah Muthersbaugh, PA-C   BP 120/74 mmHg  Pulse 73  Temp(Src) 98.5 F (36.9 C) (Oral)  Resp 18  SpO2 97%   Physical Exam  General: Well-developed, well-nourished female in no acute distress; appearance consistent with age of record HENT: normocephalic; atraumatic Eyes: pupils equal, round and reactive to light; extraocular muscles intact Neck: supple Heart: regular rate and rhythm; Lungs: clear to auscultation bilaterally Abdomen: soft; nondistended; nontender; bowel sounds present Back: paralumbar tenderness, R>L; pain on straight-leg-raise bilaterally at about 30 Extremities: No deformity; full range of motion except hips due to pain; pulses normal Neurologic: Awake, alert and oriented; motor function intact in all extremities and symmetric; no facial droop; sensation intact in lower extremities and  symmetric Skin: Warm and dry Psychiatric: Normal mood and affect    ED Course  Procedures (including critical care time)   MDM    Hanley SeamenJohn L Thomson Herbers, MD 06/04/14 [id number]

## 2014-06-04 NOTE — ED Notes (Signed)
Per EMS, patient was seen in ED for sciatica last week.  Prescribed Robaxin, Vicodin, Ibuprofen.  She had her last dose at approximately 2100 last night. She reports that severe, sharp, throbbing pain woke her from sleep at 0330.

## 2014-06-11 ENCOUNTER — Emergency Department (HOSPITAL_COMMUNITY)
Admission: EM | Admit: 2014-06-11 | Discharge: 2014-06-11 | Disposition: A | Discharge status: Home / Self Care | Payer: Medicaid Other | Attending: Emergency Medicine | Admitting: Emergency Medicine

## 2014-06-11 ENCOUNTER — Encounter (HOSPITAL_COMMUNITY): Payer: Self-pay

## 2014-06-11 DIAGNOSIS — Z7952 Long term (current) use of systemic steroids: Secondary | ICD-10-CM | POA: Insufficient documentation

## 2014-06-11 DIAGNOSIS — J45909 Unspecified asthma, uncomplicated: Secondary | ICD-10-CM | POA: Diagnosis not present

## 2014-06-11 DIAGNOSIS — M5442 Lumbago with sciatica, left side: Secondary | ICD-10-CM | POA: Insufficient documentation

## 2014-06-11 DIAGNOSIS — Z7982 Long term (current) use of aspirin: Secondary | ICD-10-CM | POA: Diagnosis not present

## 2014-06-11 DIAGNOSIS — M5430 Sciatica, unspecified side: Secondary | ICD-10-CM | POA: Diagnosis present

## 2014-06-11 DIAGNOSIS — Z720 Tobacco use: Secondary | ICD-10-CM | POA: Diagnosis not present

## 2014-06-11 HISTORY — DX: Sciatica, unspecified side: M54.30

## 2014-06-11 MED ORDER — HYDROCODONE-ACETAMINOPHEN 5-325 MG PO TABS: 1.0000 | ORAL_TABLET | Freq: Four times a day (QID) | ORAL | Status: DC | PRN

## 2014-06-11 MED ORDER — METHOCARBAMOL 500 MG PO TABS: 500.0000 mg | ORAL_TABLET | Freq: Two times a day (BID) | ORAL | Status: DC

## 2014-06-11 MED ORDER — HYDROCODONE-ACETAMINOPHEN 5-325 MG PO TABS
1.0000 | ORAL_TABLET | Freq: Once | ORAL | Status: AC
  Administered 2014-06-11: 1 via ORAL
  Filled 2014-06-11: qty 1

## 2014-06-11 MED ORDER — IBUPROFEN 800 MG PO TABS
800.0000 mg | ORAL_TABLET | Freq: Once | ORAL | Status: AC
  Administered 2014-06-11: 800 mg via ORAL
  Filled 2014-06-11: qty 1

## 2014-06-11 MED ORDER — OXYCODONE-ACETAMINOPHEN 5-325 MG PO TABS: 1.0000 | ORAL_TABLET | Freq: Once | ORAL | Status: DC

## 2014-06-11 NOTE — ED Provider Notes (Signed)
CSN: [id number]     Arrival date & time 06/11/14  0515 History   First MD Initiated Contact with Patient 06/11/14 [phone]     Chief Complaint  Patient presents with  . Sciatica     HPI  Patient presents with concern of ongoing low back pain. Patient has been present for at least 3 weeks, including during to prior evaluations here.  She states that the pain is"the same"as on her prior evaluations, and has no new characteristics, including no new incontinence, other abdominal pain, fever, chills, loss of sensation or strength in either extremity.  The pain is sore, severe, radiating from the left low back down the posterior of the left leg. Patient has improvement with Percocet prescribed prescriptions, but have out of several of them. She has not yet seen a primary care, but will in the coming week.   Past Medical History  Diagnosis Date  . Asthma   . Sciatica    History reviewed. No pertinent past surgical history. History reviewed. No pertinent family history. History  Substance Use Topics  . Smoking status: Current Every Day Smoker -- 0.50 packs/day  . Smokeless tobacco: Not on file  . Alcohol Use: No   OB History    No data available     Review of Systems  All other systems reviewed and are negative.     Allergies  Review of patient's allergies indicates no known allergies.  Home Medications   Prior to Admission medications   Medication Sig Start Date End Date Taking? Authorizing Provider  aspirin 325 MG tablet Take 325 mg by mouth 2 (two) times daily as needed for mild pain or moderate pain.    Historical Provider, MD  benzonatate (TESSALON) 100 MG capsule Take 1 capsule (100 mg total) by mouth every 8 (eight) hours. Patient not taking: Reported on 05/28/2014 08/26/13   Renne CriglerJoshua Geiple, PA-C  HYDROcodone-acetaminophen (NORCO/VICODIN) 5-325 MG per tablet Take 1-2 tablets by mouth every 6 (six) hours as needed for moderate pain or severe pain. 06/11/14   Gerhard Munchobert Carlo Lorson,  MD  ibuprofen (ADVIL,MOTRIN) 800 MG tablet Take 1 tablet (800 mg total) by mouth 3 (three) times daily. 05/28/14   Hannah Muthersbaugh, PA-C  methocarbamol (ROBAXIN) 500 MG tablet Take 1 tablet (500 mg total) by mouth 2 (two) times daily. 06/11/14   Gerhard Munchobert Novah Nessel, MD  predniSONE (DELTASONE) 10 MG tablet 4 tabs po daily x 3 days, then 3 tabs x 3 days, then 2 tabs x 3 days, then 1 tab x 3 days, then 0.5 tabs x 4 days 05/28/14   Dahlia ClientHannah Muthersbaugh, PA-C   BP 114/52 mmHg  Pulse 89  Temp(Src) 98 F (36.7 C) (Oral)  SpO2 98%  LMP 05/31/2014 Physical Exam  Constitutional: She is oriented to person, place, and time. She appears well-developed and well-nourished. No distress.  HENT:  Head: Normocephalic and atraumatic.  Eyes: Conjunctivae and EOM are normal.  Pulmonary/Chest: Effort normal. No stridor. No respiratory distress.  Musculoskeletal: She exhibits no edema.  Patient has positive straight leg test with both legs, worse on the left. Strength is symmetric, 5/5 in both legs, proximally, distally. Sensation is intact in both legs.  Neurological: She is alert and oriented to person, place, and time. No cranial nerve deficit.  Skin: Skin is warm and dry.  Psychiatric: She has a normal mood and affect.  Nursing note and vitals reviewed.   ED Course  Procedures (including critical care time) I reviewed the EMR  MDM  Final diagnoses:  Left-sided low back pain with left-sided sciatica    Patient presents with ongoing low back pain, radiating to left side. Patient has no evidence for neurologic compromise, nor hemodynamically instability. Patient was provided additional analgesics, muscle relaxants, will follow-up with primary care as scheduled next week.     Gerhard Munchobert Christabel Camire, MD 06/11/14 [phone]

## 2014-06-11 NOTE — ED Notes (Addendum)
Patient complains of sciatic pain, mostly effecting left hip/leg, but radiating down both legs.  She was seen for same last week and given medication to ease her symptoms until she could follow up with her PCP.  Because of the holidays she has been unable to see her PCP.  She complains of 10/10 pain.

## 2014-06-11 NOTE — ED Notes (Signed)
Bed: [id number] Expected date:  Expected time:  Means of arrival:  Comments: Sciatic pain

## 2014-06-11 NOTE — Discharge Instructions (Signed)
As discussed, your evaluation today has been largely reassuring.  But, it is important that you monitor your condition carefully, and do not hesitate to return to the ED if you develop new, or concerning changes in your condition.  When you meet your physician for your scheduled appointment, please be sure to discuss physical therapy, other modes of alleviating your sciatic pain  Otherwise, please follow-up with your physician for appropriate ongoing care.

## 2014-06-11 NOTE — ED Notes (Signed)
Dr.Lockwood at bedside  

## 2014-06-19 ENCOUNTER — Ambulatory Visit: Payer: Medicaid Other | Attending: Family Medicine | Admitting: Family Medicine

## 2014-06-19 ENCOUNTER — Encounter: Payer: Self-pay | Admitting: Family Medicine

## 2014-06-19 VITALS — BP 119/81 | HR 86 | Temp 98.0°F | Resp 16 | Ht 62.0 in | Wt 164.0 lb

## 2014-06-19 DIAGNOSIS — E559 Vitamin D deficiency, unspecified: Secondary | ICD-10-CM

## 2014-06-19 DIAGNOSIS — Z716 Tobacco abuse counseling: Secondary | ICD-10-CM | POA: Diagnosis not present

## 2014-06-19 DIAGNOSIS — J45909 Unspecified asthma, uncomplicated: Secondary | ICD-10-CM | POA: Diagnosis not present

## 2014-06-19 DIAGNOSIS — M5440 Lumbago with sciatica, unspecified side: Secondary | ICD-10-CM | POA: Diagnosis present

## 2014-06-19 DIAGNOSIS — M5432 Sciatica, left side: Secondary | ICD-10-CM

## 2014-06-19 DIAGNOSIS — F17200 Nicotine dependence, unspecified, uncomplicated: Secondary | ICD-10-CM | POA: Diagnosis not present

## 2014-06-19 DIAGNOSIS — Z720 Tobacco use: Secondary | ICD-10-CM

## 2014-06-19 LAB — COMPLETE METABOLIC PANEL WITH GFR
ALT: 13 U/L (ref 0–35)
AST: 15 U/L (ref 0–37)
Albumin: 3.7 g/dL (ref 3.5–5.2)
Alkaline Phosphatase: 43 U/L (ref 39–117)
BUN: 7 mg/dL (ref 6–23)
CALCIUM: 9.5 mg/dL (ref 8.4–10.5)
CO2: 29 mEq/L (ref 19–32)
CREATININE: 0.74 mg/dL (ref 0.50–1.10)
Chloride: 105 mEq/L (ref 96–112)
GFR, Est African American: >89 mL/min
GLUCOSE: 85 mg/dL (ref 70–99)
POTASSIUM: 4.7 meq/L (ref 3.5–5.3)
Sodium: 139 mEq/L (ref 135–145)
TOTAL PROTEIN: 5.8 g/dL — AB (ref 6.0–8.3)
Total Bilirubin: 0.3 mg/dL (ref 0.2–1.2)

## 2014-06-19 LAB — CBC
HCT: 38.8 % (ref 36.0–46.0)
Hemoglobin: 13.1 g/dL (ref 12.0–15.0)
MCH: 28.8 pg (ref 26.0–34.0)
MCHC: 33.8 g/dL (ref 30.0–36.0)
MCV: 85.3 fL (ref 78.0–100.0)
MPV: 10.5 fL (ref 8.6–12.4)
Platelets: 355 10*3/uL (ref 150–400)
RBC: 4.55 MIL/uL (ref 3.87–5.11)
RDW: 14.6 % (ref 11.5–15.5)
WBC: 6.7 10*3/uL (ref 4.0–10.5)

## 2014-06-19 MED ORDER — METHOCARBAMOL 500 MG PO TABS: 500.0000 mg | ORAL_TABLET | Freq: Three times a day (TID) | ORAL | Status: DC | PRN

## 2014-06-19 MED ORDER — PREDNISONE 10 MG PO TABS: ORAL_TABLET | ORAL | Status: DC

## 2014-06-19 MED ORDER — TRAMADOL HCL 50 MG PO TABS: 50.0000 mg | ORAL_TABLET | Freq: Three times a day (TID) | ORAL | Status: DC | PRN

## 2014-06-19 MED ORDER — DICLOFENAC SODIUM 75 MG PO TBEC: 75.0000 mg | DELAYED_RELEASE_TABLET | Freq: Two times a day (BID) | ORAL | Status: DC | PRN

## 2014-06-19 NOTE — Progress Notes (Signed)
   Subjective:    Patient ID: Crystal Koch, female    DOB: [birth year], 34 y.o.   MRN: [id number] CC: establish care, ED f/u for low back pain   HPI 34 yo F presents to establish care and discuss the following:  1. Back pain: patient has been to the ED 3 times for back pain in the last month. Her pain started 05/2010. Worse in winter months. Severe this month. Low back on the L. Sharp pains in right hip. Tingling sensation. Numbness in lower back. Radiates down left leg to foot. No trauma. Admits to chills. No fever, weight loss, or fecal or urinary incontinence. Feels weakness when rising from sitting.   Soc Hx: current smoker 1/2 PPD  Med Hx: asthma dx in 1986 Surg Hx: C-section in 2011   Review of Systems As per HPI    Objective:   Physical Exam BP 119/81 mmHg  Pulse 86  Temp(Src) 98 F (36.7 C) (Oral)  Resp 16  Ht 5\' 2"  (1.575 m)  Wt 164 lb (74.39 kg)  BMI 29.99 kg/m2  SpO2 99%  LMP 05/31/2014 General appearance: alert, cooperative and no distress Lungs: clear to auscultation bilaterally Heart: regular rate and rhythm, S1, S2 normal, no murmur, click, rub or gallop Back Exam: Back: Normal Curvature, no deformities or CVA tenderness  Paraspinal Tenderness: L5-S1  LE Strength 5/5  LE Sensation: in tact  LE Reflexes 2+ and symmetric  Straight leg raise: positive on the L        Assessment & Plan:

## 2014-06-19 NOTE — Patient Instructions (Addendum)
Ms. Crystal Koch,  Thank you for coming in today. It was a pleasure meeting you. I look forward to being your primary doctor.   1. Low back pain with sciatica: possibly from a herniated disk. Plan: MRI for further evaluation Baseline labs.  Medication: Today start prednisone, take with food.  Continue robaxin as needed. Changed antiinflammatory to diclofenac for as needed use. Do not take NSIAD (diclofenanc or ibuprofen) with taking prednisone.  For severe pain not controlled with the above, tramadol as needed.  I will review the MRI. You will be called with results and follow up plan which will likely include physical therapy and possibly a referral to spine surgery.    2. Smoking cessation support: smoking cessation hotline: 1-800-QUIT-NOW.  Smoking cessation classes are available through [facility]. Call [phone] or visit our website at [email or web address].  F/u in 4 weeks  Dr. Armen PickupFunches

## 2014-06-19 NOTE — Progress Notes (Signed)
Pt is here to establish care. Pt is c/o pain in her left leg, right hip and lower back.

## 2014-06-20 LAB — VITAMIN D 25 HYDROXY (VIT D DEFICIENCY, FRACTURES): Vit D, 25-Hydroxy: 14 ng/mL — ABNORMAL LOW (ref 30–100)

## 2014-06-22 DIAGNOSIS — E559 Vitamin D deficiency, unspecified: Secondary | ICD-10-CM | POA: Insufficient documentation

## 2014-06-22 MED ORDER — VITAMIN D (ERGOCALCIFEROL) 1.25 MG (50000 UNIT) PO CAPS: 50000.0000 [IU] | ORAL_CAPSULE | ORAL | Status: DC

## 2014-06-22 NOTE — Assessment & Plan Note (Signed)
A: vit D def P: Vit D supplement

## 2014-06-22 NOTE — Assessment & Plan Note (Signed)
  2. Smoking cessation support: smoking cessation hotline: 1-800-QUIT-NOW.  Smoking cessation classes are available through [facility]. Call [phone] or visit our website at [email or web address].

## 2014-06-22 NOTE — Assessment & Plan Note (Signed)
  1. Low back pain with sciatica: possibly from a herniated disk. Plan: MRI for further evaluation Baseline labs.  Medication: Today start prednisone, take with food.  Continue robaxin as needed. Changed antiinflammatory to diclofenac for as needed use. Do not take NSIAD (diclofenanc or ibuprofen) with taking prednisone.  For severe pain not controlled with the above, tramadol as needed.  I will review the MRI. You will be called with results and follow up plan which will likely include physical therapy and possibly a referral to spine surgery.

## 2014-06-22 NOTE — Addendum Note (Signed)
Addended by: Dessa PhiFUNCHES, Harley Mccartney on: 06/22/2014 08:49 AM   Modules accepted: Orders

## 2014-06-23 ENCOUNTER — Telehealth: Payer: Self-pay | Admitting: *Deleted

## 2014-06-23 NOTE — Telephone Encounter (Signed)
Pt is aware of her lab results. Pt received her medication already.

## 2014-06-23 NOTE — Telephone Encounter (Signed)
-----   Message from Lora PaulaJosalyn C Funches, MD sent at 06/22/2014  8:47 AM EST ----- Normal CBC and CMP. Vit D deficiency, vitamin D supplement prescribed.

## 2014-07-06 ENCOUNTER — Other Ambulatory Visit: Payer: Self-pay | Admitting: Family Medicine

## 2014-07-06 NOTE — Telephone Encounter (Signed)
Refill request sent to [facility].  Martin, Tamika L, RN  

## 2014-07-08 ENCOUNTER — Ambulatory Visit (HOSPITAL_COMMUNITY): Payer: Medicaid Other

## 2014-08-05 ENCOUNTER — Other Ambulatory Visit: Payer: Self-pay | Admitting: Family Medicine

## 2021-02-22 ENCOUNTER — Ambulatory Visit: Payer: Self-pay | Admitting: Nurse Practitioner

## 2021-02-22 ENCOUNTER — Other Ambulatory Visit: Payer: Self-pay

## 2021-02-22 ENCOUNTER — Encounter: Payer: Self-pay | Admitting: Family Medicine

## 2021-02-22 ENCOUNTER — Ambulatory Visit: Payer: Self-pay | Attending: Nurse Practitioner | Admitting: Family Medicine

## 2021-02-22 DIAGNOSIS — K2900 Acute gastritis without bleeding: Secondary | ICD-10-CM

## 2021-02-22 DIAGNOSIS — M722 Plantar fascial fibromatosis: Secondary | ICD-10-CM

## 2021-02-22 MED ORDER — OMEPRAZOLE 40 MG PO CPDR
40.0000 mg | DELAYED_RELEASE_CAPSULE | Freq: Every day | ORAL | 3 refills | Status: DC
  Filled 2021-02-22: qty 30, 30d supply, fill #0

## 2021-02-22 MED ORDER — MELOXICAM 7.5 MG PO TABS
7.5000 mg | ORAL_TABLET | Freq: Every day | ORAL | 1 refills | Status: DC
  Filled 2021-02-22: qty 30, 30d supply, fill #0
  Filled 2021-03-24: qty 30, 30d supply, fill #1

## 2021-02-22 NOTE — Progress Notes (Signed)
Virtual Visit via Telephone Note  I connected with Crystal Koch, on 02/22/2021 at 1:49 PM by telephone due to the COVID-19 pandemic and verified that I am speaking with the correct person using two identifiers.   Consent: I discussed the limitations, risks, security and privacy concerns of performing an evaluation and management service by telephone and the availability of in person appointments. I also discussed with the patient that there may be a patient responsible charge related to this service. The patient expressed understanding and agreed to proceed.   Location of Patient: Home  Location of Provider: Clinic   Persons participating in Telemedicine visit: Crystal Koch Dr. Alvis Lemmings     History of Present Illness: Crystal Koch is a 40 y.o. year old female who presents to establish care.  She was last seen in the clinic in 2016 and relocated to [place] but is back to [place].   For the last 6 months she has had L heel pain and has had changed out several insoles which would be effective initially but later became ineffective.  She is on her feet a lot at work.  Denies using OTC medications.  She complains of stomach pains only in the mornings which will resolve on eating for going on 3 months She endorses having reflux and symptoms are worse with fried or spicy foods.  Denies presence of nausea, vomiting, diarrhea. Past Medical History:  Diagnosis Date   Asthma 1986   Sciatica 2011   since son was born. exacerbated every winter.    No Known Allergies  Current Outpatient Medications on File Prior to Visit  Medication Sig Dispense Refill   diclofenac (VOLTAREN) 75 MG EC tablet Take 1 tablet (75 mg total) by mouth 2 (two) times daily as needed (take with food). 60 tablet 1   methocarbamol (ROBAXIN) 500 MG tablet Take 1 tablet (500 mg total) by mouth every 8 (eight) hours as needed for muscle spasms. 60 tablet 1   predniSONE (DELTASONE) 10 MG tablet 4 tabs po daily x 3  days, then 3 tabs x 3 days, then 2 tabs x 3 days, then 1 tab x 3 days, then 0.5 tabs x 4 days 42 tablet 0   traMADol (ULTRAM) 50 MG tablet Take 1 tablet (50 mg total) by mouth every 8 (eight) hours as needed for severe pain. 45 tablet 0   Vitamin D, Ergocalciferol, (DRISDOL) 50000 UNITS CAPS capsule Take 1 capsule (50,000 Units total) by mouth every 7 (seven) days. For 8 weeks. 8 capsule 0   No current facility-administered medications on file prior to visit.    ROS: See HPI  Observations/Objective: Awake, alert, oriented x3 Not in acute distress Normal mood  CMP Latest Ref Rng & Units 06/19/2014  Glucose 70 - 99 mg/dL 85  BUN 6 - 23 mg/dL 7  Creatinine 7.62 - 8.31 mg/dL 5.17  Sodium 616 - 073 mEq/L 139  Potassium 3.5 - 5.3 mEq/L 4.7  Chloride 96 - 112 mEq/L 105  CO2 19 - 32 mEq/L 29  Calcium 8.4 - 10.5 mg/dL 9.5  Total Protein 6.0 - 8.3 g/dL 7.1(G)  Total Bilirubin 0.2 - 1.2 mg/dL 0.3  Alkaline Phos 39 - 117 U/L 43  AST 0 - 37 U/L 15  ALT 0 - 35 U/L 13    Assessment and Plan: 1. Plantar fasciitis of left foot Advised to continue with insoles Discussed stretching exercises for plantar fasciitis Will place on oral NSAIDs If symptoms persist she will need to be  referred to podiatry for foot injections - meloxicam (MOBIC) 7.5 MG tablet; Take 1 tablet (7.5 mg total) by mouth daily.  Dispense: 30 tablet; Refill: 1  2. Other acute gastritis without hemorrhage With associated reflux symptoms Counseled to avoid foods that trigger, avoid recumbency up to 2 hours post meals Will place on PPI - omeprazole (PRILOSEC) 40 MG capsule; Take 1 capsule (40 mg total) by mouth daily.  Dispense: 30 capsule; Refill: 3   Follow Up Instructions: 1 month for complete physical exam and labs   I discussed the assessment and treatment plan with the patient. The patient was provided an opportunity to ask questions and all were answered. The patient agreed with the plan and demonstrated an  understanding of the instructions.   The patient was advised to call back or seek an in-person evaluation if the symptoms worsen or if the condition fails to improve as anticipated.     I provided 13 minutes total of non-face-to-face time during this encounter.   Hoy Register, MD, FAAFP. [facility] [place], [place] [phone]   02/22/2021, 1:49 PM

## 2021-02-25 ENCOUNTER — Other Ambulatory Visit: Payer: Self-pay

## 2021-03-15 ENCOUNTER — Ambulatory Visit: Payer: Self-pay | Admitting: *Deleted

## 2021-03-15 NOTE — Telephone Encounter (Signed)
Scheduled appointment with Angus Seller, NP for Wednesday 03/16/2021

## 2021-03-15 NOTE — Telephone Encounter (Signed)
Pt reports toothache, top right front tooth and top right back tooth, onset yesterday. Reports 9/10 pain, gums swollen at areas, no facial swelling, no fever. Pt does not have dentist.Pt questioning if PCP would call in antibiotic. Advised would need to be seen, no availability. Advised UC or tele, virtual visit. Pt states will try tele visit through [facility]. Advised UC for fever, facial swelling. Pt verbalizes understanding.

## 2021-03-15 NOTE — Telephone Encounter (Signed)
Summary: oral discomfort   The patient is experiencing significant oral discomfort   The patient has been experiencing this discomfort since yesterday 03/14/21   The patient was NOT on the phone at the time of call with agent   The agent was contacted by a friend of the patient on their behalf   Please contact further when possible      Reason for Disposition  [1] SEVERE pain (e.g., excruciating, unable to do any normal activities) AND [2] not improved 2 hours after pain medicine  Answer Assessment - Initial Assessment Questions 1. LOCATION: "Which tooth is hurting?"  (e.g., right-side/left-side, upper/lower, front/back)     Front tooth right side, back top right side 2. ONSET: "When did the toothache start?"  (e.g., hours, days)      Yesterday am 3. SEVERITY: "How bad is the toothache?"  (Scale 1-10; mild, moderate or severe)   - MILD (1-3): doesn't interfere with chewing    - MODERATE (4-7): interferes with chewing, interferes with normal activities, awakens from sleep     - SEVERE (8-10): unable to eat, unable to do any normal activities, excruciating pain        9/10 4. SWELLING: "Is there any visible swelling of your face?"     Gums swollen right side 5. OTHER SYMPTOMS: "Do you have any other symptoms?" (e.g., fever)     no  Protocols used: Toothache-A-AH

## 2021-03-16 ENCOUNTER — Other Ambulatory Visit: Payer: Self-pay

## 2021-03-16 ENCOUNTER — Telehealth (INDEPENDENT_AMBULATORY_CARE_PROVIDER_SITE_OTHER): Payer: Self-pay | Admitting: Nurse Practitioner

## 2021-03-16 ENCOUNTER — Encounter: Payer: Self-pay | Admitting: Nurse Practitioner

## 2021-03-16 DIAGNOSIS — K047 Periapical abscess without sinus: Secondary | ICD-10-CM

## 2021-03-16 MED ORDER — AMOXICILLIN 875 MG PO TABS
875.0000 mg | ORAL_TABLET | Freq: Two times a day (BID) | ORAL | 0 refills | Status: DC
  Filled 2021-03-16: qty 20, 10d supply, fill #0

## 2021-03-16 MED ORDER — IBUPROFEN 600 MG PO TABS
600.0000 mg | ORAL_TABLET | Freq: Three times a day (TID) | ORAL | 0 refills | Status: DC | PRN
  Filled 2021-03-16: qty 30, 10d supply, fill #0

## 2021-03-16 NOTE — Patient Instructions (Signed)
Dental abscess:  Will order amoxicillin  Will order ibuprofen for pain and inflammation   Follow up:  Follow up with PCP - please go to the ED if symptoms worsen  Dental Abscess A dental abscess is an area of pus in or around a tooth. It comes from an infection. It can cause pain and other symptoms. Treatment will help with symptoms and prevent the infection from spreading. What are the causes? This condition is caused by an infection in or around the tooth. This can be from: Very bad tooth decay (cavities). A bad injury to the tooth, such as a broken or chipped tooth. What increases the risk? The risk to get an abscess is higher in males. It is also more likely in people who: Have dental decay. Have very bad gum disease. Eat sugary snacks between meals. Use tobacco. Have diabetes. Have a weak disease-fighting system (immune system). Do not brush their teeth regularly. What are the signs or symptoms? Some mild symptoms are: Tenderness. Bad breath. Fever. A sharp, sour taste in the mouth. Pain in and around the infected tooth. Worse symptoms of this condition include: Swollen neck glands. Chills. Pus draining around the tooth. Swelling and redness around the tooth, the mouth, or the face. Very bad pain in and around the tooth. The worst symptoms can include: Difficulty swallowing. Difficulty opening your mouth. Feeling like you may vomit or vomiting. How is this treated? This is treated by getting rid of the infection. Your dentist will discuss ways to do this, including: Antibiotic medicines. Antibacterial mouth rinse. An incision in the abscess to drain out the pus. A root canal. Removing the tooth. Follow these instructions at home: Medicines Take over-the-counter and prescription medicines only as told by your dentist. If you were prescribed an antibiotic medicine, take it as told by your dentist. Do not stop taking it even if you start to feel better. If you  were prescribed a gel that has numbing medicine in it, use it exactly as told. Ask your dentist if you should avoid driving or using machines while you are taking your medicine. General instructions Rinse your mouth often with salt water. To make salt water, dissolve -1 tsp (3-6 g) of salt in 1 cup (237 mL) of warm water. Eat a soft diet while your mouth is healing. Drink enough fluid to keep your pee (urine) pale yellow. Do not apply heat to the outside of your mouth. Do not smoke or use any products that contain nicotine or tobacco. If you need help quitting, ask your dentist. Keep all follow-up visits. Prevent an abscess Brush your teeth every morning and every night. Use fluoride toothpaste. Floss your teeth each day. Get dental cleanings as often as told by your dentist. Think about getting dental sealant put on teeth that have deep holes (decay). Drink water that has fluoride in it. Most tap water has fluoride. Check the label on bottled water to see if it has fluoride in it. Drink water instead of sugary drinks. Eat healthy meals and snacks. Wear a mouth guard or face shield when you play sports. Contact a doctor if: Your pain is worse and medicine does not help. Get help right away if: You have a fever or chills. Your symptoms suddenly get worse. You have a very bad headache. You have problems breathing or swallowing. You have trouble opening your mouth. You have swelling in your neck or close to your eye. These symptoms may be an emergency. Get help right  away. Call your local emergency services (911 in the [place] Do not wait to see if the symptoms will go away. Do not drive yourself to the hospital. Summary A dental abscess is an area of pus in or around a tooth. It is caused by an infection. Treatment will help with symptoms and prevent the infection from spreading. Take over-the-counter and prescription medicines only as told by your dentist. To prevent an abscess,  take good care of your teeth. Brush your teeth every morning and night. Use floss every day. Get dental cleanings as often as told by your dentist. This information is not intended to replace advice given to you by your health care provider. Make sure you discuss any questions you have with your health care provider. Document Revised: 08/05/2020 Document Reviewed: 08/05/2020 Elsevier Patient Education  2022 [employer].

## 2021-03-16 NOTE — Progress Notes (Signed)
Virtual Visit via Telephone Note  I connected with Crystal Koch on 03/16/21 at  8:00 AM EDT by telephone and verified that I am speaking with the correct person using two identifiers.  Location: Patient: home Provider: office   I discussed the limitations, risks, security and privacy concerns of performing an evaluation and management service by telephone and the availability of in person appointments. I also discussed with the patient that there may be a patient responsible charge related to this service. The patient expressed understanding and agreed to proceed.   History of Present Illness:  Patient presents today for acute visit through televisit.  Patient states that she has been having dental pain to the right upper side of her mouth.  She has been having redness and swelling around her upper right side teeth.  This started 3 days ago.  Patient does not currently have a dentist.  She is concerned that she does have a dental abscess.  We discussed that we can start her on antibiotics and place a referral for dentistry.  Patient will need to call her PCP office to inquire about any frequent clinics in the area. Denies f/c/s, n/v/d, hemoptysis, PND, chest pain or edema.     Observations/Objective:  Vitals with BMI 06/19/2014 06/11/2014 06/04/2014  Height 5\' 2"  - -  Weight 164 lbs - -  BMI 30.1 - -  Systolic 119 114  Diastolic 81 52 74  Pulse 86 89 73      Assessment and Plan:  Dental abscess:  Will order amoxicillin  Will order ibuprofen for pain and inflammation   Follow up:  Follow up with PCP - please go to the ED if symptoms worsen  Patient Instructions  Dental abscess:  Will order amoxicillin  Will order ibuprofen for pain and inflammation   Follow up:  Follow up with PCP - please go to the ED if symptoms worsen  Dental Abscess A dental abscess is an area of pus in or around a tooth. It comes from an infection. It can cause pain and other symptoms.  Treatment will help with symptoms and prevent the infection from spreading. What are the causes? This condition is caused by an infection in or around the tooth. This can be from: Very bad tooth decay (cavities). A bad injury to the tooth, such as a broken or chipped tooth. What increases the risk? The risk to get an abscess is higher in males. It is also more likely in people who: Have dental decay. Have very bad gum disease. Eat sugary snacks between meals. Use tobacco. Have diabetes. Have a weak disease-fighting system (immune system). Do not brush their teeth regularly. What are the signs or symptoms? Some mild symptoms are: Tenderness. Bad breath. Fever. A sharp, sour taste in the mouth. Pain in and around the infected tooth. Worse symptoms of this condition include: Swollen neck glands. Chills. Pus draining around the tooth. Swelling and redness around the tooth, the mouth, or the face. Very bad pain in and around the tooth. The worst symptoms can include: Difficulty swallowing. Difficulty opening your mouth. Feeling like you may vomit or vomiting. How is this treated? This is treated by getting rid of the infection. Your dentist will discuss ways to do this, including: Antibiotic medicines. Antibacterial mouth rinse. An incision in the abscess to drain out the pus. A root canal. Removing the tooth. Follow these instructions at home: Medicines Take over-the-counter and prescription medicines only as told by your dentist. If you  were prescribed an antibiotic medicine, take it as told by your dentist. Do not stop taking it even if you start to feel better. If you were prescribed a gel that has numbing medicine in it, use it exactly as told. Ask your dentist if you should avoid driving or using machines while you are taking your medicine. General instructions Rinse your mouth often with salt water. To make salt water, dissolve -1 tsp (3-6 g) of salt in 1 cup (237 mL)  of warm water. Eat a soft diet while your mouth is healing. Drink enough fluid to keep your pee (urine) pale yellow. Do not apply heat to the outside of your mouth. Do not smoke or use any products that contain nicotine or tobacco. If you need help quitting, ask your dentist. Keep all follow-up visits. Prevent an abscess Brush your teeth every morning and every night. Use fluoride toothpaste. Floss your teeth each day. Get dental cleanings as often as told by your dentist. Think about getting dental sealant put on teeth that have deep holes (decay). Drink water that has fluoride in it. Most tap water has fluoride. Check the label on bottled water to see if it has fluoride in it. Drink water instead of sugary drinks. Eat healthy meals and snacks. Wear a mouth guard or face shield when you play sports. Contact a doctor if: Your pain is worse and medicine does not help. Get help right away if: You have a fever or chills. Your symptoms suddenly get worse. You have a very bad headache. You have problems breathing or swallowing. You have trouble opening your mouth. You have swelling in your neck or close to your eye. These symptoms may be an emergency. Get help right away. Call your local emergency services (911 in the [place] Do not wait to see if the symptoms will go away. Do not drive yourself to the hospital. Summary A dental abscess is an area of pus in or around a tooth. It is caused by an infection. Treatment will help with symptoms and prevent the infection from spreading. Take over-the-counter and prescription medicines only as told by your dentist. To prevent an abscess, take good care of your teeth. Brush your teeth every morning and night. Use floss every day. Get dental cleanings as often as told by your dentist. This information is not intended to replace advice given to you by your health care provider. Make sure you discuss any questions you have with your health care  provider. Document Revised: 08/05/2020 Document Reviewed: 08/05/2020 Elsevier Patient Education  2022 [employer].     I discussed the assessment and treatment plan with the patient. The patient was provided an opportunity to ask questions and all were answered. The patient agreed with the plan and demonstrated an understanding of the instructions.   The patient was advised to call back or seek an in-person evaluation if the symptoms worsen or if the condition fails to improve as anticipated.  I provided 23 minutes of non-face-to-face time during this encounter.   Ivonne Andrew, NP

## 2021-03-17 ENCOUNTER — Emergency Department (HOSPITAL_COMMUNITY)
Admission: EM | Admit: 2021-03-17 | Discharge: 2021-03-17 | Disposition: A | Discharge status: Home / Self Care | Payer: Self-pay | Attending: Emergency Medicine | Admitting: Emergency Medicine

## 2021-03-17 ENCOUNTER — Encounter (HOSPITAL_COMMUNITY): Payer: Self-pay | Admitting: Emergency Medicine

## 2021-03-17 ENCOUNTER — Other Ambulatory Visit: Payer: Self-pay

## 2021-03-17 ENCOUNTER — Ambulatory Visit: Payer: Self-pay | Admitting: *Deleted

## 2021-03-17 DIAGNOSIS — K047 Periapical abscess without sinus: Secondary | ICD-10-CM | POA: Insufficient documentation

## 2021-03-17 DIAGNOSIS — J45909 Unspecified asthma, uncomplicated: Secondary | ICD-10-CM | POA: Insufficient documentation

## 2021-03-17 DIAGNOSIS — F17210 Nicotine dependence, cigarettes, uncomplicated: Secondary | ICD-10-CM | POA: Insufficient documentation

## 2021-03-17 MED ORDER — AMOXICILLIN-POT CLAVULANATE 875-125 MG PO TABS
1.0000 | ORAL_TABLET | Freq: Two times a day (BID) | ORAL | 0 refills | Status: DC
  Filled 2021-03-17: qty 14, 7d supply, fill #0

## 2021-03-17 MED ORDER — OXYCODONE-ACETAMINOPHEN 5-325 MG PO TABS
1.0000 | ORAL_TABLET | Freq: Once | ORAL | Status: AC
  Administered 2021-03-17: 1 via ORAL
  Filled 2021-03-17: qty 1

## 2021-03-17 MED ORDER — CHLORHEXIDINE GLUCONATE 0.12 % MT SOLN
15.0000 mL | Freq: Two times a day (BID) | OROMUCOSAL | 0 refills | Status: DC
  Filled 2021-03-17: qty 473, 16d supply, fill #0

## 2021-03-17 NOTE — Telephone Encounter (Signed)
Per agent : "Pt is calling and was given amoxicillin yesterday for dental pain by tonya NP at [facility] . Pt is calling and abx is not helping the pain. Please advie "  Please see previous triage, virtual visit from yesterday. Pt started ATB yesterday.States swelling and pain has worsened. "Entire right side of face swollen, gums swollen."  "Pain shooting up to temples, excruciating.Could not sleep at all"  Taking IBU, ineffective. Pt sounds distressed. Advised ED, pt states will follow disposition.    Reason for Disposition  [1] SEVERE pain (e.g., excruciating, unable to do any normal activities) AND [2] not improved 2 hours after pain medicine  Answer Assessment - Initial Assessment Questions 1. LOCATION: "Which tooth is hurting?"  (e.g., right-side/left-side, upper/lower, front/back)     Please see summary. 2. ONSET: "When did the toothache start?"  (e.g., hours, days)      *No Answer* 3. SEVERITY: "How bad is the toothache?"  (Scale 1-10; mild, moderate or severe)   - MILD (1-3): doesn't interfere with chewing    - MODERATE (4-7): interferes with chewing, interferes with normal activities, awakens from sleep     - SEVERE (8-10): unable to eat, unable to do any normal activities, excruciating pain        *No Answer* 4. SWELLING: "Is there any visible swelling of your face?"     *No Answer* 5. OTHER SYMPTOMS: "Do you have any other symptoms?" (e.g., fever)     *No Answer* 6. PREGNANCY: "Is there any chance you are pregnant?" "When was your last menstrual period?"     *No Answer*  Protocols used: Toothache-A-AH

## 2021-03-17 NOTE — ED Triage Notes (Signed)
Pt c/o right sided dental pain that began Monday. States she was rx'd abx and has been taking them since Monday with no improvement. Also reports oral swelling, facial pain and head pressure. Denies fevers and injury to area.

## 2021-03-17 NOTE — ED Provider Notes (Signed)
[facility]-EMERGENCY DEPT Provider Note   CSN: [id number] Arrival date & time: 03/17/21  1512     History   Chief Complaint Chief Complaint  Patient presents with   Dental Pain    HPI Crystal Koch is a 40 y.o. female.  Patient presents to the emergency department with a dental complaint. Symptoms began Monday. The patient has tried to alleviate pain with Orajel which she states provides him with relief.  Pain rated as severe sharp achy and 10/10, characterized as throbbing in nature and located right upper and right lower dentition. Patient denies fever, night sweats, chills, difficulty swallowing or opening mouth, SOB, nuchal rigidity or decreased ROM of neck.  Patient does not have a dentist and requests a resource guide at discharge.  States she was prescribed antibiotics on Monday over the phone she was given amoxicillin and she has me that this does not contain clavulanic component.  Denies any difficulty chewing or swallowing.    HPI  Past Medical History:  Diagnosis Date   Asthma 1986   Sciatica 2011   since son was born. exacerbated every winter.     Patient Active Problem List   Diagnosis Date Noted   Vitamin D deficiency 06/22/2014   Sciatica of left side 06/19/2014   Current smoker 06/19/2014    Past Surgical History:  Procedure Laterality Date   CESAREAN SECTION  2011     OB History   No obstetric history on file.      Home Medications    Prior to Admission medications   Medication Sig Start Date End Date Taking? Authorizing Provider  amoxicillin-clavulanate (AUGMENTIN) 875-125 MG tablet Take 1 tablet by mouth every 12 (twelve) hours. 03/17/21  Yes Chance Karam S, PA  chlorhexidine (PERIDEX) 0.12 % solution Use as directed 15 mLs in the mouth or throat 2 (two) times daily. 03/17/21  Yes Khalil Szczepanik S, PA  ibuprofen (ADVIL) 600 MG tablet Take 1 tablet (600 mg total) by mouth every 8 (eight) hours as needed. 03/16/21    Ivonne Andrew, NP  meloxicam (MOBIC) 7.5 MG tablet Take 1 tablet (7.5 mg total) by mouth daily. 02/22/21   Hoy Register, MD  methocarbamol (ROBAXIN) 500 MG tablet Take 1 tablet (500 mg total) by mouth every 8 (eight) hours as needed for muscle spasms. 06/19/14   Funches, Gerilyn Nestle, MD  omeprazole (PRILOSEC) 40 MG capsule Take 1 capsule (40 mg total) by mouth daily. 02/22/21   Hoy Register, MD  predniSONE (DELTASONE) 10 MG tablet 4 tabs po daily x 3 days, then 3 tabs x 3 days, then 2 tabs x 3 days, then 1 tab x 3 days, then 0.5 tabs x 4 days 06/19/14   Dessa Phi, MD  traMADol (ULTRAM) 50 MG tablet Take 1 tablet (50 mg total) by mouth every 8 (eight) hours as needed for severe pain. 06/19/14   Funches, Gerilyn Nestle, MD  Vitamin D, Ergocalciferol, (DRISDOL) 50000 UNITS CAPS capsule Take 1 capsule (50,000 Units total) by mouth every 7 (seven) days. For 8 weeks. 06/22/14   Dessa Phi, MD    Family History Family History  Problem Relation Age of Onset   Cancer Maternal Grandmother    Cancer Father    Thyroid disease Mother    Hypertension Maternal Uncle    Diabetes Neg Hx     Social History Social History   Tobacco Use   Smoking status: Every Day    Packs/day: 0.50    Years: 16.00  Pack years: 8.00    Types: Cigarettes   Smokeless tobacco: Never  Substance Use Topics   Alcohol use: No   Drug use: Yes    Types: Marijuana     Allergies   Patient has no known allergies.   Review of Systems Denies fevers, chills, difficulty swallowing or eating, changes in voice, pain under tongue, nausea, vomiting, lightheadedness or dizziness. No trismus   Physical Exam Updated Vital Signs BP (!) 141/70   Pulse 80   Temp 98.5 F (36.9 C) (Oral)   Resp 18   Ht 5' 2.5" (1.588 m)   Wt 65.8 kg   SpO2 99%   BMI 26.10 kg/m   Physical Exam Physical Exam  Constitutional: Pt appears well-developed and well-nourished.  HENT:  Head: Normocephalic.  Right Ear: Tympanic membrane,  external ear and ear canal normal.  Left Ear: Tympanic membrane, external ear and ear canal normal.  Nose: Nose normal. Right sinus exhibits no maxillary sinus tenderness and no frontal sinus tenderness. Left sinus exhibits no maxillary sinus tenderness and no frontal sinus tenderness.  Mouth/Throat: Uvula is midline, oropharynx is clear and moist and mucous membranes are normal. No oral lesions. No uvula swelling or lacerations. No oropharyngeal exudate, posterior oropharyngeal edema, posterior oropharyngeal erythema or tonsillar abscesses.  Poor dentition No gingival swelling, fluctuance or induration No gross abscess  No sublingual edema, tenderness to palpation, or sign of Ludwig's angina, or deep space infection Pain at RL gumline and RU gumline. Small periapical abscess RU gumline.  Eyes: Conjunctivae are normal. Pupils are equal, round, and reactive to light. Right eye exhibits no discharge. Left eye exhibits no discharge.  Neck: Normal range of motion. Neck supple.  No stridor Handling secretions without difficulty No nuchal rigidity No cervical lymphadenopathy Cardiovascular: Normal rate, regular rhythm and normal heart sounds.   Pulmonary/Chest: Effort normal. No respiratory distress.  Equal chest rise  Abdominal: Soft. Bowel sounds are normal. Pt exhibits no distension. There is no tenderness.  Lymphadenopathy: Pt has no cervical adenopathy.  Neurological: Pt is alert and oriented x 4  Skin: Skin is warm and dry.  Psychiatric: Pt has a normal mood and affect.  Nursing note and vitals reviewed.   ED Treatments / Results  Labs (all labs ordered are listed, but only abnormal results are displayed) Labs Reviewed - No data to display  EKG    Radiology No results found.  Procedures Procedures (including critical care time)  Medications Ordered in ED Medications  oxyCODONE-acetaminophen (PERCOCET/ROXICET) 5-325 MG per tablet 1 tablet (1 tablet Oral Given 03/17/21 1818)      Initial Impression / Assessment and Plan / ED Course  I have reviewed the triage vital signs and the nursing notes.  Pertinent labs & imaging results that were available during my care of the patient were reviewed by me and considered in my medical decision making (see chart for details).  Clinical Course as of 03/17/21 1851  Thu Mar 17, 2021  1724 Pain since Monday R dental pain Ammox since Monday.  Pain radiates to temple. Worse at night.  [WF]    Clinical Course User Index [WF] Gailen Shelter, [place]       Patient with dentalgia.  No abscess requiring immediate incision and drainage.  Exam not concerning for Ludwig's angina or pharyngeal abscess.  Will treat with augmentin. Pt instructed to follow-up with dentist.  Discussed return precautions. Pt safe for discharge.  Will follow up with dentistry. FU w dental.   Final  Clinical Impressions(s) / ED Diagnoses   Final diagnoses:  Periapical abscess    ED Discharge Orders          Ordered    amoxicillin-clavulanate (AUGMENTIN) 875-125 MG tablet  Every 12 hours        03/17/21 1811    chlorhexidine (PERIDEX) 0.12 % solution  2 times daily        03/17/21 1811              Solon Augusta [place], [place] 03/17/21 1853    Vanetta Mulders, MD 03/22/21 1538

## 2021-03-17 NOTE — ED Notes (Signed)
An After Visit Summary was printed and given to the patient. Discharge instructions given and no further questions at this time.  Pt leaving with family.  

## 2021-03-17 NOTE — Telephone Encounter (Signed)
Noted.  Per epic pt has been seen in Ed.

## 2021-03-17 NOTE — Discharge Instructions (Addendum)
You have a dental infection.  I have provided you with augmentin an antibiotic to use (has an additional medicine in it).   Please followup with dentistry. I have given you the information for a dental office.  Please use Tylenol or ibuprofen for pain.  You may use 600 mg ibuprofen every 6 hours or 1000 mg of Tylenol every 6 hours.  You may choose to alternate between the 2.  This would be most effective.  Not to exceed 4 g of Tylenol within 24 hours.  Not to exceed 3200 mg ibuprofen 24 hours.

## 2021-03-18 ENCOUNTER — Other Ambulatory Visit: Payer: Self-pay

## 2021-03-22 ENCOUNTER — Ambulatory Visit: Payer: Self-pay | Admitting: *Deleted

## 2021-03-22 NOTE — Telephone Encounter (Signed)
Pt will be given medication after results are back from upcoming pap smear.

## 2021-03-22 NOTE — Telephone Encounter (Signed)
Summary: personal discomfort   The patient has recently been prescribed antibiotics and believe the newly prescribed medications are causing them personal discomfort   The patient noticed significant discharge as well as irritation in their personal area   The patient would like to further discuss these symptoms with a member of staff when possible      Reason for Disposition  [1] Symptoms of a "yeast infection" (i.e., itchy, white discharge, not bad smelling) AND [2] not improved > 3 days following CARE ADVICE  Answer Assessment - Initial Assessment Questions 1. DISCHARGE: "Describe the discharge." (e.g., white, yellow, green, gray, foamy, cottage cheese-like)     White, creamy, clumps 2. ODOR: "Is there a bad odor?"     no 3. ONSET: "When did the discharge begin?"     3 days 4. RASH: "Is there a rash in that area?" If Yes, ask: "Describe it." (e.g., redness, blisters, sores, bumps)     no 5. ABDOMINAL PAIN: "Are you having any abdominal pain?" If Yes, ask: "What does it feel like? " (e.g., crampy, dull, intermittent, constant)      no 6. ABDOMINAL PAIN SEVERITY: If present, ask: "How bad is it?"  (e.g., mild, moderate, severe)  - MILD - doesn't interfere with normal activities   - MODERATE - interferes with normal activities or awakens from sleep   - SEVERE - patient doesn't want to move (R/O peritonitis)      N/a 7. CAUSE: "What do you think is causing the discharge?" "Have you had the same problem before? What happened then?"     Current antibiotic use 8. OTHER SYMPTOMS: "Do you have any other symptoms?" (e.g., fever, itching, vaginal bleeding, pain with urination, injury to genital area, vaginal foreign body)     Cycle is late 9. PREGNANCY: "Is there any chance you are pregnant?" "When was your last menstrual period?"     Yes- LMP- 02/19/20  Protocols used: Vaginal Discharge-A-AH

## 2021-03-22 NOTE — Telephone Encounter (Signed)
Patient is currently using Augmentin for abscess treatment- patient reports she is having yeast symptoms- white discharge,itching.  Advised OTC treatment- but patient is coming to office Thursday for pap and check up- due to upcoming appointment- may not want all that medication in vagina- request Oral yeast treatment.

## 2021-03-24 ENCOUNTER — Other Ambulatory Visit (HOSPITAL_COMMUNITY)
Admission: RE | Admit: 2021-03-24 | Discharge: 2021-03-24 | Disposition: A | Discharge status: Home / Self Care | Payer: Self-pay | Source: Ambulatory Visit | Attending: Family Medicine | Admitting: Family Medicine

## 2021-03-24 ENCOUNTER — Other Ambulatory Visit: Payer: Self-pay

## 2021-03-24 ENCOUNTER — Ambulatory Visit: Payer: Self-pay | Attending: Family Medicine | Admitting: Family Medicine

## 2021-03-24 ENCOUNTER — Encounter: Payer: Self-pay | Admitting: Family Medicine

## 2021-03-24 VITALS — BP 118/77 | HR 80 | Ht 62.0 in | Wt 166.2 lb

## 2021-03-24 DIAGNOSIS — Z1159 Encounter for screening for other viral diseases: Secondary | ICD-10-CM

## 2021-03-24 DIAGNOSIS — B3731 Acute candidiasis of vulva and vagina: Secondary | ICD-10-CM | POA: Insufficient documentation

## 2021-03-24 DIAGNOSIS — Z1231 Encounter for screening mammogram for malignant neoplasm of breast: Secondary | ICD-10-CM

## 2021-03-24 DIAGNOSIS — Z124 Encounter for screening for malignant neoplasm of cervix: Secondary | ICD-10-CM

## 2021-03-24 DIAGNOSIS — Z0000 Encounter for general adult medical examination without abnormal findings: Secondary | ICD-10-CM

## 2021-03-24 MED ORDER — FLUCONAZOLE 150 MG PO TABS
150.0000 mg | ORAL_TABLET | Freq: Once | ORAL | 0 refills | Status: AC
  Filled 2021-03-24: qty 1, 1d supply, fill #0

## 2021-03-24 NOTE — Patient Instructions (Signed)
Health Maintenance, Female Adopting a healthy lifestyle and getting preventive care are important in promoting health and wellness. Ask your health care provider about: The right schedule for you to have regular tests and exams. Things you can do on your own to prevent diseases and keep yourself healthy. What should I know about diet, weight, and exercise? Eat a healthy diet  Eat a diet that includes plenty of vegetables, fruits, low-fat dairy products, and lean protein. Do not eat a lot of foods that are high in solid fats, added sugars, or sodium. Maintain a healthy weight Body mass index (BMI) is used to identify weight problems. It estimates body fat based on height and weight. Your health care provider can help determine your BMI and help you achieve or maintain a healthy weight. Get regular exercise Get regular exercise. This is one of the most important things you can do for your health. Most adults should: Exercise for at least 150 minutes each week. The exercise should increase your heart rate and make you sweat (moderate-intensity exercise). Do strengthening exercises at least twice a week. This is in addition to the moderate-intensity exercise. Spend less time sitting. Even light physical activity can be beneficial. Watch cholesterol and blood lipids Have your blood tested for lipids and cholesterol at 40 years of age, then have this test every 5 years. Have your cholesterol levels checked more often if: Your lipid or cholesterol levels are high. You are older than 40 years of age. You are at high risk for heart disease. What should I know about cancer screening? Depending on your health history and family history, you may need to have cancer screening at various ages. This may include screening for: Breast cancer. Cervical cancer. Colorectal cancer. Skin cancer. Lung cancer. What should I know about heart disease, diabetes, and high blood pressure? Blood pressure and heart  disease High blood pressure causes heart disease and increases the risk of stroke. This is more likely to develop in people who have high blood pressure readings, are of African descent, or are overweight. Have your blood pressure checked: Every 3-5 years if you are 18-39 years of age. Every year if you are 40 years old or older. Diabetes Have regular diabetes screenings. This checks your fasting blood sugar level. Have the screening done: Once every three years after age 40 if you are at a normal weight and have a low risk for diabetes. More often and at a younger age if you are overweight or have a high risk for diabetes. What should I know about preventing infection? Hepatitis B If you have a higher risk for hepatitis B, you should be screened for this virus. Talk with your health care provider to find out if you are at risk for hepatitis B infection. Hepatitis C Testing is recommended for: Everyone born from 1945 through 1965. Anyone with known risk factors for hepatitis C. Sexually transmitted infections (STIs) Get screened for STIs, including gonorrhea and chlamydia, if: You are sexually active and are younger than 40 years of age. You are older than 40 years of age and your health care provider tells you that you are at risk for this type of infection. Your sexual activity has changed since you were last screened, and you are at increased risk for chlamydia or gonorrhea. Ask your health care provider if you are at risk. Ask your health care provider about whether you are at high risk for HIV. Your health care provider may recommend a prescription medicine   to help prevent HIV infection. If you choose to take medicine to prevent HIV, you should first get tested for HIV. You should then be tested every 3 months for as long as you are taking the medicine. Pregnancy If you are about to stop having your period (premenopausal) and you may become pregnant, seek counseling before you get  pregnant. Take 400 to 800 micrograms (mcg) of folic acid every day if you become pregnant. Ask for birth control (contraception) if you want to prevent pregnancy. Osteoporosis and menopause Osteoporosis is a disease in which the bones lose minerals and strength with aging. This can result in bone fractures. If you are 65 years old or older, or if you are at risk for osteoporosis and fractures, ask your health care provider if you should: Be screened for bone loss. Take a calcium or vitamin D supplement to lower your risk of fractures. Be given hormone replacement therapy (HRT) to treat symptoms of menopause. Follow these instructions at home: Lifestyle Do not use any products that contain nicotine or tobacco, such as cigarettes, e-cigarettes, and chewing tobacco. If you need help quitting, ask your health care provider. Do not use street drugs. Do not share needles. Ask your health care provider for help if you need support or information about quitting drugs. Alcohol use Do not drink alcohol if: Your health care provider tells you not to drink. You are pregnant, may be pregnant, or are planning to become pregnant. If you drink alcohol: Limit how much you use to 0-1 drink a day. Limit intake if you are breastfeeding. Be aware of how much alcohol is in your drink. In the [place] one drink equals one 12 oz bottle of beer (355 mL), one 5 oz glass of wine (148 mL), or one 1 oz glass of hard liquor (44 mL). General instructions Schedule regular health, dental, and eye exams. Stay current with your vaccines. Tell your health care provider if: You often feel depressed. You have ever been abused or do not feel safe at home. Summary Adopting a healthy lifestyle and getting preventive care are important in promoting health and wellness. Follow your health care provider's instructions about healthy diet, exercising, and getting tested or screened for diseases. Follow your health care provider's  instructions on monitoring your cholesterol and blood pressure. This information is not intended to replace advice given to you by your health care provider. Make sure you discuss any questions you have with your health care provider. Document Revised: 08/06/2020 Document Reviewed: 05/22/2018 Elsevier Patient Education  2022 Elsevier Inc.  

## 2021-03-24 NOTE — Progress Notes (Signed)
Physical/NO PAP  Pain in left foot. Dental abscess.  Needs Diflucan pill.

## 2021-03-24 NOTE — Progress Notes (Signed)
Subjective:  Patient ID: Crystal Koch, female    DOB: 28-Dec-1980  Age: 40 y.o. MRN: [id number]  CC: Annual Exam   HPI Crystal Koch is a 40 y.o. year old female who presents today for an annual physical exam. She is due for Pap smear and mammogram.  Interval History: Seen at the ED for periapical dental abscess and placed on Augmentin on 03/17/21.. She is requesting a Diflucan pill as she feels she is having a yeast infection as she has vaginal itching. Past Medical History:  Diagnosis Date   Asthma 1986   Sciatica 2011   since son was born. exacerbated every winter.     Past Surgical History:  Procedure Laterality Date   CESAREAN SECTION  2011    Family History  Problem Relation Age of Onset   Cancer Maternal Grandmother    Cancer Father    Thyroid disease Mother    Hypertension Maternal Uncle    Diabetes Neg Hx     No Known Allergies  Outpatient Medications Prior to Visit  Medication Sig Dispense Refill   amoxicillin-clavulanate (AUGMENTIN) 875-125 MG tablet Take 1 tablet by mouth every 12 (twelve) hours. 14 tablet 0   chlorhexidine (PERIDEX) 0.12 % solution Use as directed 15 mLs in the mouth or throat 2 (two) times daily. 473 mL 0   meloxicam (MOBIC) 7.5 MG tablet Take 1 tablet (7.5 mg total) by mouth daily. 30 tablet 1   omeprazole (PRILOSEC) 40 MG capsule Take 1 capsule (40 mg total) by mouth daily. 30 capsule 3   ibuprofen (ADVIL) 600 MG tablet Take 1 tablet (600 mg total) by mouth every 8 (eight) hours as needed. (Patient not taking: Reported on 03/24/2021) 30 tablet 0   methocarbamol (ROBAXIN) 500 MG tablet Take 1 tablet (500 mg total) by mouth every 8 (eight) hours as needed for muscle spasms. (Patient not taking: Reported on 03/24/2021) 60 tablet 1   predniSONE (DELTASONE) 10 MG tablet 4 tabs po daily x 3 days, then 3 tabs x 3 days, then 2 tabs x 3 days, then 1 tab x 3 days, then 0.5 tabs x 4 days (Patient not taking: Reported on 03/24/2021) 42 tablet 0    traMADol (ULTRAM) 50 MG tablet Take 1 tablet (50 mg total) by mouth every 8 (eight) hours as needed for severe pain. (Patient not taking: Reported on 03/24/2021) 45 tablet 0   Vitamin D, Ergocalciferol, (DRISDOL) 50000 UNITS CAPS capsule Take 1 capsule (50,000 Units total) by mouth every 7 (seven) days. For 8 weeks. (Patient not taking: Reported on 03/24/2021) 8 capsule 0   No facility-administered medications prior to visit.     ROS Review of Systems  Constitutional:  Negative for activity change, appetite change and fatigue.  HENT:  Negative for congestion, sinus pressure and sore throat.   Eyes:  Negative for visual disturbance.  Respiratory:  Negative for cough, chest tightness, shortness of breath and wheezing.   Cardiovascular:  Negative for chest pain and palpitations.  Gastrointestinal:  Negative for abdominal distention, abdominal pain and constipation.  Endocrine: Negative for polydipsia.  Genitourinary:  Negative for dysuria and frequency.  Musculoskeletal:  Negative for arthralgias and back pain.  Skin:  Negative for rash.  Neurological:  Negative for tremors, light-headedness and numbness.  Hematological:  Does not bruise/bleed easily.  Psychiatric/Behavioral:  Negative for agitation and behavioral problems.    Objective:  BP 118/77   Pulse 80   Ht 5' 2"  (1.575 m)   Wt 166 lb  3.2 oz (75.4 kg)   SpO2 98%   BMI 30.40 kg/m   BP/Weight 03/24/2021 34/06/9377 0/07/4095  Systolic BP 353 299 242  Diastolic BP 77 70 81  Wt. (Lbs) 166.2 145 164  BMI 30.4 26.1 29.99      Physical Exam Exam conducted with a chaperone present.  Constitutional:      General: She is not in acute distress.    Appearance: She is well-developed. She is not diaphoretic.  HENT:     Head: Normocephalic.     Right Ear: External ear normal.     Left Ear: External ear normal.     Nose: Nose normal.  Eyes:     Conjunctiva/sclera: Conjunctivae normal.     Pupils: Pupils are equal, round, and  reactive to light.  Neck:     Vascular: No JVD.  Cardiovascular:     Rate and Rhythm: Normal rate and regular rhythm.     Heart sounds: Normal heart sounds. No murmur heard.   No gallop.  Pulmonary:     Effort: Pulmonary effort is normal. No respiratory distress.     Breath sounds: Normal breath sounds. No wheezing or rales.  Chest:     Chest wall: No tenderness.  Breasts:    Right: Normal. No mass, nipple discharge or tenderness.     Left: Normal. No mass, nipple discharge or tenderness.  Abdominal:     General: Bowel sounds are normal. There is no distension.     Palpations: Abdomen is soft. There is no mass.     Tenderness: There is no abdominal tenderness.     Hernia: There is no hernia in the left inguinal area or right inguinal area.  Genitourinary:    General: Normal vulva.     Pubic Area: No rash.      Labia:        Right: No rash.        Left: No rash.      Vagina: Normal.     Cervix: Normal.     Uterus: Normal.      Adnexa: Right adnexa normal and left adnexa normal.       Right: No tenderness.         Left: No tenderness.    Musculoskeletal:        General: No tenderness. Normal range of motion.     Cervical back: Normal range of motion. No tenderness.  Lymphadenopathy:     Upper Body:     Right upper body: No supraclavicular or axillary adenopathy.     Left upper body: No supraclavicular or axillary adenopathy.  Skin:    General: Skin is warm and dry.  Neurological:     Mental Status: She is alert and oriented to person, place, and time.     Deep Tendon Reflexes: Reflexes are normal and symmetric.    CMP Latest Ref Rng & Units 06/19/2014  Glucose 70 - 99 mg/dL 85  BUN 6 - 23 mg/dL 7  Creatinine 0.50 - 1.10 mg/dL 0.74  Sodium 135 - 145 mEq/L 139  Potassium 3.5 - 5.3 mEq/L 4.7  Chloride 96 - 112 mEq/L 105  CO2 19 - 32 mEq/L 29  Calcium 8.4 - 10.5 mg/dL 9.5  Total Protein 6.0 - 8.3 g/dL 5.8(L)  Total Bilirubin 0.2 - 1.2 mg/dL 0.3  Alkaline Phos 39 - 117  U/L 43  AST 0 - 37 U/L 15  ALT 0 - 35 U/L 13    Lipid Panel  No results found for: CHOL, TRIG, HDL, CHOLHDL, VLDL, LDLCALC, LDLDIRECT  CBC    Component Value Date/Time   WBC 6.7 06/19/2014 0954   RBC 4.55 06/19/2014 0954   HGB 13.1 06/19/2014 0954   HCT 38.8 06/19/2014 0954   PLT 355 06/19/2014 0954   MCV 85.3 06/19/2014 0954   MCH 28.8 06/19/2014 0954   MCHC 33.8 06/19/2014 0954   RDW 14.6 06/19/2014 0954    No results found for: HGBA1C  Assessment & Plan:  1. Annual physical exam Counseled on 150 minutes of exercise per week, healthy eating (including decreased daily intake of saturated fats, cholesterol, added sugars, sodium), routine healthcare maintenance.  - Hemoglobin A1c - Lipid panel - CMP14+EGFR - CBC with Differential/Platelet  2. Encounter for screening mammogram for malignant neoplasm of breast - MM 3D SCREEN BREAST BILATERAL; Future  3. Screening for cervical cancer - Cytology - PAP([facility])  4. Screening for viral disease - HIV Antibody (routine testing w rflx) - HCV Ab w Reflex to Quant PCR  5. Vaginal candidiasis Secondary to recent antibiotic use Placed on Diflucan     Meds ordered this encounter  Medications   fluconazole (DIFLUCAN) 150 MG tablet    Sig: Take 1 tablet (150 mg total) by mouth once for 1 dose.    Dispense:  1 tablet    Refill:  0    Follow-up: Return in about 1 year (around 03/24/2022) for annual physical exam.       Charlott Rakes, MD, FAAFP. [facility] [place], [place]   03/24/2021, 2:14 PM

## 2021-03-25 ENCOUNTER — Other Ambulatory Visit: Payer: Self-pay

## 2021-03-25 DIAGNOSIS — B3731 Acute candidiasis of vulva and vagina: Secondary | ICD-10-CM

## 2021-03-25 LAB — CBC WITH DIFFERENTIAL/PLATELET
Basophils Absolute: 0 10*3/uL (ref 0.0–0.2)
Basos: 1 %
EOS (ABSOLUTE): 0.2 10*3/uL (ref 0.0–0.4)
Eos: 2 %
Hematocrit: 39.8 % (ref 34.0–46.6)
Hemoglobin: 13.2 g/dL (ref 11.1–15.9)
Immature Grans (Abs): 0 10*3/uL (ref 0.0–0.1)
Immature Granulocytes: 0 %
Lymphocytes Absolute: 3 10*3/uL (ref 0.7–3.1)
Lymphs: 37 %
MCH: 27.7 pg (ref 26.6–33.0)
MCHC: 33.2 g/dL (ref 31.5–35.7)
MCV: 84 fL (ref 79–97)
Monocytes Absolute: 0.6 10*3/uL (ref 0.1–0.9)
Monocytes: 7 %
Neutrophils Absolute: 4.5 10*3/uL (ref 1.4–7.0)
Neutrophils: 53 %
Platelets: 420 10*3/uL (ref 150–450)
RBC: 4.76 x10E6/uL (ref 3.77–5.28)
RDW: 12.6 % (ref 11.7–15.4)
WBC: 8.3 10*3/uL (ref 3.4–10.8)

## 2021-03-25 LAB — LIPID PANEL
Chol/HDL Ratio: 5 ratio — ABNORMAL HIGH (ref 0.0–4.4)
Cholesterol, Total: 171 mg/dL (ref 100–199)
HDL: 34 mg/dL — ABNORMAL LOW (ref >39)
LDL Chol Calc (NIH): 124 mg/dL — ABNORMAL HIGH (ref 0–99)
Triglycerides: 68 mg/dL (ref 0–149)
VLDL Cholesterol Cal: 13 mg/dL (ref 5–40)

## 2021-03-25 LAB — HEMOGLOBIN A1C
Est. average glucose Bld gHb Est-mCnc: 117 mg/dL
Hgb A1c MFr Bld: 5.7 % — ABNORMAL HIGH (ref 4.8–5.6)

## 2021-03-25 LAB — HCV AB W REFLEX TO QUANT PCR: HCV Ab: <0.1 s/co ratio (ref 0.0–0.9)

## 2021-03-25 LAB — CMP14+EGFR
ALT: 27 IU/L (ref 0–32)
AST: 22 IU/L (ref 0–40)
Albumin/Globulin Ratio: 1.8 (ref 1.2–2.2)
Albumin: 4.4 g/dL (ref 3.8–4.8)
Alkaline Phosphatase: 65 IU/L (ref 44–121)
BUN/Creatinine Ratio: 14 (ref 9–23)
BUN: 10 mg/dL (ref 6–24)
Bilirubin Total: <0.2 mg/dL (ref 0.0–1.2)
CO2: 24 mmol/L (ref 20–29)
Calcium: 9.3 mg/dL (ref 8.7–10.2)
Chloride: 104 mmol/L (ref 96–106)
Creatinine, Ser: 0.69 mg/dL (ref 0.57–1.00)
Globulin, Total: 2.4 g/dL (ref 1.5–4.5)
Glucose: 74 mg/dL (ref 70–99)
Potassium: 3.9 mmol/L (ref 3.5–5.2)
Sodium: 142 mmol/L (ref 134–144)
Total Protein: 6.8 g/dL (ref 6.0–8.5)
eGFR: 112 mL/min/{1.73_m2} (ref >59)

## 2021-03-25 LAB — HIV ANTIBODY (ROUTINE TESTING W REFLEX): HIV Screen 4th Generation wRfx: NONREACTIVE

## 2021-03-25 LAB — CYTOLOGY - PAP
Comment: NEGATIVE
Diagnosis: NEGATIVE
High risk HPV: NEGATIVE

## 2021-03-25 LAB — HCV INTERPRETATION

## 2021-03-28 LAB — CERVICOVAGINAL ANCILLARY ONLY
Bacterial Vaginitis (gardnerella): NEGATIVE
Candida Glabrata: NEGATIVE
Candida Vaginitis: POSITIVE — AB
Chlamydia: NEGATIVE
Comment: NEGATIVE
Comment: NEGATIVE
Comment: NEGATIVE
Comment: NEGATIVE
Comment: NEGATIVE
Comment: NORMAL
Neisseria Gonorrhea: NEGATIVE
Trichomonas: NEGATIVE

## 2021-03-31 ENCOUNTER — Telehealth: Payer: Self-pay

## 2021-03-31 NOTE — Telephone Encounter (Signed)
Patient was called and informed of lab results. Patient had no questions.  

## 2021-04-12 ENCOUNTER — Telehealth: Payer: Self-pay

## 2021-04-12 NOTE — Telephone Encounter (Signed)
Patient name and DOB has been verified Patient was informed of lab results. Patient had no questions.  

## 2021-04-12 NOTE — Telephone Encounter (Signed)
-----   Message from Hoy Register, MD sent at 03/29/2021 10:08 AM EDT ----- Vaginal swab reveal presence of yeast infection for which she already received a Diflucan pill at her last visit

## 2021-05-30 ENCOUNTER — Ambulatory Visit: Payer: Self-pay | Admitting: *Deleted

## 2021-05-30 NOTE — Telephone Encounter (Signed)
°  Chief Complaint: sore throat Symptoms: sore throat, pain- R side of throat  Frequency: started Saturday Pertinent Negatives: Patient denies fever, congestion Disposition: [] ED /[x] Urgent Care (no appt availability in office) / [] Appointment(In office/virtual)/ []  [place] [facility]/ [] Home Care/ [] Refused Recommended Disposition  Additional Notes: Advised UC- no open appointment in office

## 2021-05-30 NOTE — Telephone Encounter (Signed)
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 States her lymph node is swollen only on the right side of her neck about 2 days.   Schedule appt for eval at [facility] on 05/31/21.

## 2021-05-30 NOTE — Telephone Encounter (Signed)
Pt has sore throat, only on right side. A little swelling on the inside.  Was hoping to get abx.  Symptoms started Sat, and not gone away this morning.  Reason for Disposition  [1] Sore throat is the only symptom AND [2] present > 48 hours  Answer Assessment - Initial Assessment Questions 1. ONSET: "When did the throat start hurting?" (Hours or days ago)      Saturday 2. SEVERITY: "How bad is the sore throat?" (Scale 1-10; mild, moderate or severe)   - MILD (1-3):  doesn't interfere with eating or normal activities   - MODERATE (4-7): interferes with eating some solids and normal activities   - SEVERE (8-10):  excruciating pain, interferes with most normal activities   - SEVERE DYSPHAGIA: can't swallow liquids, drooling     moderate 3. STREP EXPOSURE: "Has there been any exposure to strep within the past week?" If Yes, ask: "What type of contact occurred?"      no 4.  VIRAL SYMPTOMS: "Are there any symptoms of a cold, such as a runny nose, cough, hoarse voice or red eyes?"      Some hoarseness this morning 5. FEVER: "Do you have a fever?" If Yes, ask: "What is your temperature, how was it measured, and when did it start?"     no 6. PUS ON THE TONSILS: "Is there pus on the tonsils in the back of your throat?"     No white- just swelling -R 7. OTHER SYMPTOMS: "Do you have any other symptoms?" (e.g., difficulty breathing, headache, rash)     Right side pain head to shoulder 8. PREGNANCY: "Is there any chance you are pregnant?" "When was your last menstrual period?"     No- LMP-first week of December  Protocols used: Sore Throat-A-AH

## 2021-05-31 ENCOUNTER — Other Ambulatory Visit: Payer: Self-pay

## 2021-05-31 ENCOUNTER — Encounter: Payer: Self-pay | Admitting: Nurse Practitioner

## 2021-05-31 ENCOUNTER — Ambulatory Visit (INDEPENDENT_AMBULATORY_CARE_PROVIDER_SITE_OTHER): Payer: Self-pay | Admitting: Nurse Practitioner

## 2021-05-31 VITALS — BP 122/85 | HR 92 | Resp 16 | Wt 172.2 lb

## 2021-05-31 DIAGNOSIS — J019 Acute sinusitis, unspecified: Secondary | ICD-10-CM

## 2021-05-31 DIAGNOSIS — J029 Acute pharyngitis, unspecified: Secondary | ICD-10-CM

## 2021-05-31 LAB — POCT RAPID STREP A (OFFICE): Rapid Strep A Screen: NEGATIVE

## 2021-05-31 MED ORDER — PREDNISONE 20 MG PO TABS
20.0000 mg | ORAL_TABLET | Freq: Every day | ORAL | 0 refills | Status: AC
  Filled 2021-05-31: qty 5, 5d supply, fill #0

## 2021-05-31 MED ORDER — FLUCONAZOLE 150 MG PO TABS
150.0000 mg | ORAL_TABLET | Freq: Once | ORAL | 0 refills | Status: AC
  Filled 2021-05-31: qty 1, 1d supply, fill #0

## 2021-05-31 MED ORDER — AMOXICILLIN 875 MG PO TABS
875.0000 mg | ORAL_TABLET | Freq: Two times a day (BID) | ORAL | 0 refills | Status: AC
  Filled 2021-05-31: qty 20, 10d supply, fill #0

## 2021-05-31 NOTE — Assessment & Plan Note (Signed)
Sore throat:  Will check for strep  Will order prednisone  Warm salt water gargles  Follow up:  Follow up if needed

## 2021-05-31 NOTE — Patient Instructions (Signed)
Sinusitis Sore throat:  Will check for strep  Will order prednisone  Warm salt water gargles  Follow up:  Follow up if needed

## 2021-05-31 NOTE — Progress Notes (Signed)
@Patient  ID: , female    DOB: 1980/12/18, 40 y.o.   MRN: [id number]  Chief Complaint  Patient presents with   Adenopathy    Referring provider: [id number], MD  HPI  Patient presents today for sick visit.  She states for the last few days she has been having right-sided facial pain and has noticed swelling to her lymph nodes.  She states that she has been having sore throat.  She denies any significant fever. Denies f/c/s, n/v/d, hemoptysis, PND, chest pain or edema.     No Known Allergies   There is no immunization history on file for this patient.  Past Medical History:  Diagnosis Date   Asthma 1986   Sciatica 2011   since son was born. exacerbated every winter.     Tobacco History: Social History   Tobacco Use  Smoking Status Every Day   Packs/day: 0.50   Years: 16.00   Pack years: 8.00   Types: Cigarettes  Smokeless Tobacco Never   Ready to quit: Not Answered Counseling given: Not Answered   Outpatient Encounter Medications as of 05/31/2021  Medication Sig   amoxicillin (AMOXIL) 875 MG tablet Take 1 tablet (875 mg total) by mouth 2 (two) times daily for 10 days.   [EXPIRED] fluconazole (DIFLUCAN) 150 MG tablet Take 1 tablet (150 mg total) by mouth once for 1 dose.   meloxicam (MOBIC) 7.5 MG tablet Take 1 tablet (7.5 mg total) by mouth daily.   predniSONE (DELTASONE) 20 MG tablet Take 1 tablet (20 mg total) by mouth daily with breakfast for 5 days.   chlorhexidine (PERIDEX) 0.12 % solution Use as directed 15 mLs in the mouth or throat 2 (two) times daily. (Patient not taking: Reported on 05/31/2021)   ibuprofen (ADVIL) 600 MG tablet Take 1 tablet (600 mg total) by mouth every 8 (eight) hours as needed. (Patient not taking: Reported on 03/24/2021)   methocarbamol (ROBAXIN) 500 MG tablet Take 1 tablet (500 mg total) by mouth every 8 (eight) hours as needed for muscle spasms. (Patient not taking: Reported on 03/24/2021)   omeprazole (PRILOSEC) 40 MG  capsule Take 1 capsule (40 mg total) by mouth daily. (Patient not taking: Reported on 05/31/2021)   traMADol (ULTRAM) 50 MG tablet Take 1 tablet (50 mg total) by mouth every 8 (eight) hours as needed for severe pain. (Patient not taking: Reported on 03/24/2021)   Vitamin D, Ergocalciferol, (DRISDOL) 50000 UNITS CAPS capsule Take 1 capsule (50,000 Units total) by mouth every 7 (seven) days. For 8 weeks. (Patient not taking: Reported on 03/24/2021)   [DISCONTINUED] amoxicillin-clavulanate (AUGMENTIN) 875-125 MG tablet Take 1 tablet by mouth every 12 (twelve) hours. (Patient not taking: Reported on 05/31/2021)   [DISCONTINUED] predniSONE (DELTASONE) 10 MG tablet 4 tabs po daily x 3 days, then 3 tabs x 3 days, then 2 tabs x 3 days, then 1 tab x 3 days, then 0.5 tabs x 4 days (Patient not taking: Reported on 03/24/2021)   No facility-administered encounter medications on file as of 05/31/2021.     Review of Systems  Review of Systems  Constitutional: Negative.   HENT:  Positive for ear pain and sore throat.   Cardiovascular: Negative.   Gastrointestinal: Negative.   Allergic/Immunologic: Negative.   Neurological: Negative.   Psychiatric/Behavioral: Negative.        Physical Exam  BP 122/85    Pulse 92    Resp 16    Wt 172 lb 3.2 oz (78.1 kg)  SpO2 96%    BMI 31.50 kg/m   Wt Readings from Last 5 Encounters:  05/31/21 172 lb 3.2 oz (78.1 kg)  03/24/21 166 lb 3.2 oz (75.4 kg)  03/17/21 145 lb (65.8 kg)  06/19/14 164 lb (74.4 kg)     Physical Exam Vitals and nursing note reviewed.  Constitutional:      General: She is not in acute distress.    Appearance: She is well-developed.  HENT:     Mouth/Throat:     Pharynx: Posterior oropharyngeal erythema present.  Cardiovascular:     Rate and Rhythm: Normal rate and regular rhythm.  Pulmonary:     Effort: Pulmonary effort is normal.     Breath sounds: Normal breath sounds.  Neurological:     Mental Status: She is alert and  oriented to person, place, and time.     Lab Results:  CBC    Component Value Date/Time   WBC 8.3 03/24/2021 1059   WBC 6.7 06/19/2014 0954   RBC 4.76 03/24/2021 1059   RBC 4.55 06/19/2014 0954   HGB 13.2 03/24/2021 1059   HCT 39.8 03/24/2021 1059   PLT 420 03/24/2021 1059   MCV 84 03/24/2021 1059   MCH 27.7 03/24/2021 1059   MCH 28.8 06/19/2014 0954   MCHC 33.2 03/24/2021 1059   MCHC 33.8 06/19/2014 0954   RDW 12.6 03/24/2021 1059   LYMPHSABS 3.0 03/24/2021 1059   EOSABS 0.2 03/24/2021 1059   BASOSABS 0.0 03/24/2021 1059    BMET    Component Value Date/Time   NA 142 03/24/2021 1059   K 3.9 03/24/2021 1059   CL 104 03/24/2021 1059   CO2 24 03/24/2021 1059   GLUCOSE 74 03/24/2021 1059   GLUCOSE 85 06/19/2014 0954   BUN 10 03/24/2021 1059   CREATININE 0.69 03/24/2021 1059   CREATININE 0.74 06/19/2014 0954   CALCIUM 9.3 03/24/2021 1059   GFRNONAA >89 06/19/2014 0954   GFRAA >89 06/19/2014 0954    BNP No results found for: BNP  ProBNP No results found for: PROBNP  Imaging: No results found.   Assessment & Plan:   Acute non-recurrent sinusitis Sore throat:  Will check for strep  Will order prednisone  Warm salt water gargles  Follow up:  Follow up if needed     Ivonne Andrew, NP 06/02/2021

## 2021-06-02 ENCOUNTER — Encounter: Payer: Self-pay | Admitting: Nurse Practitioner

## 2021-06-03 LAB — CULTURE, GROUP A STREP

## 2021-07-20 ENCOUNTER — Ambulatory Visit: Payer: Self-pay | Admitting: *Deleted

## 2021-07-20 NOTE — Telephone Encounter (Signed)
°  Chief Complaint: sore throat Symptoms: feverish, headache Frequency: started 2 days ago- worse today Pertinent Negatives: Patient denies difficulty breathing Disposition: [] ED /[x] Urgent Care (no appt availability in office) / [] Appointment(In office/virtual)/ []  [place] [facility]/ [] Home Care/ [] Refused Recommended Disposition /[] [place] [facility]/ []  Follow-up with PCP Additional Notes: Advised UC- no office appointment available.

## 2021-07-20 NOTE — Telephone Encounter (Signed)
Reason for Disposition  SEVERE (e.g., excruciating) throat pain  Answer Assessment - Initial Assessment Questions 1. ONSET: "When did the throat start hurting?" (Hours or days ago)      Off/on 2 days 2. SEVERITY: "How bad is the sore throat?" (Scale 1-10; mild, moderate or severe)   - MILD (1-3):  doesn't interfere with eating or normal activities   - MODERATE (4-7): interferes with eating some solids and normal activities   - SEVERE (8-10):  excruciating pain, interferes with most normal activities   - SEVERE DYSPHAGIA: can't swallow liquids, drooling     severe 3. STREP EXPOSURE: "Has there been any exposure to strep within the past week?" If Yes, ask: "What type of contact occurred?"      2 months ago patient diagnosed with strep- same pain same side-R 4.  VIRAL SYMPTOMS: "Are there any symptoms of a cold, such as a runny nose, cough, hoarse voice or red eyes?"      Dry cough, hot 5. FEVER: "Do you have a fever?" If Yes, ask: "What is your temperature, how was it measured, and when did it start?"     no 6. PUS ON THE TONSILS: "Is there pus on the tonsils in the back of your throat?"     no 7. OTHER SYMPTOMS: "Do you have any other symptoms?" (e.g., difficulty breathing, headache, rash)     headache 8. PREGNANCY: "Is there any chance you are pregnant?" "When was your last menstrual period?"     *No Answer*  Protocols used: Sore Throat-A-AH

## 2021-07-21 NOTE — Telephone Encounter (Signed)
Called placed to patient and VM was left informing patient to return phone call to schedule an appointment.

## 2021-08-16 ENCOUNTER — Ambulatory Visit: Payer: Self-pay

## 2021-08-16 NOTE — Telephone Encounter (Signed)
?  Chief Complaint: dizziness ?Symptoms: dizziness, nausea, hot and clammy ?Frequency: 2 days, comes and goes ?Pertinent Negatives: Patient denies symptoms being current during call ?Disposition: [] ED /[] Urgent Care (no appt availability in office) / [x] Appointment(In office/virtual)/ []  [place] [facility]/ [] Home Care/ [] Refused Recommended Disposition /[] [place] [facility]/ []  Follow-up with PCP ?Additional Notes:  ? ? ?Reason for Disposition ? [1] MODERATE dizziness (e.g., interferes with normal activities) AND [2] has NOT been evaluated by physician for this  (Exception: dizziness caused by heat exposure, sudden standing, or poor fluid intake) ? ?Answer Assessment - Initial Assessment Questions ?1. DESCRIPTION: "Describe your dizziness." ?    Room spinning, feels like passing out  ?2. LIGHTHEADED: "Do you feel lightheaded?" (e.g., somewhat faint, woozy, weak upon standing) ?    yes ?3. VERTIGO: "Do you feel like either you or the room is spinning or tilting?" (i.e. vertigo) ?    yes ?4. SEVERITY: "How bad is it?"  "Do you feel like you are going to faint?" "Can you stand and walk?" ?  - MILD: Feels slightly dizzy, but walking normally. ?  - MODERATE: Feels unsteady when walking, but not falling; interferes with normal activities (e.g., school, work). ?  - SEVERE: Unable to walk without falling, or requires assistance to walk without falling; feels like passing out now.  ?    moderate ?5. ONSET:  "When did the dizziness begin?" ?    2 days  ?6. AGGRAVATING FACTORS: "Does anything make it worse?" (e.g., standing, change in head position) ?    Nothing in particular  ?10. OTHER SYMPTOMS: "Do you have any other symptoms?" (e.g., fever, chest pain, vomiting, diarrhea, bleeding) ?      Nausea, feeling hot and clammy ? ?Protocols used: Dizziness - Lightheadedness-A-AH ? ?

## 2021-08-17 ENCOUNTER — Other Ambulatory Visit: Payer: Self-pay

## 2021-08-17 ENCOUNTER — Ambulatory Visit: Payer: Self-pay | Attending: Physician Assistant | Admitting: Physician Assistant

## 2021-08-17 VITALS — BP 135/85 | HR 71 | Wt 165.4 lb

## 2021-08-17 DIAGNOSIS — E559 Vitamin D deficiency, unspecified: Secondary | ICD-10-CM

## 2021-08-17 DIAGNOSIS — R232 Flushing: Secondary | ICD-10-CM

## 2021-08-17 DIAGNOSIS — R42 Dizziness and giddiness: Secondary | ICD-10-CM

## 2021-08-17 DIAGNOSIS — R110 Nausea: Secondary | ICD-10-CM

## 2021-08-17 LAB — POCT URINE PREGNANCY: Preg Test, Ur: NEGATIVE

## 2021-08-17 MED ORDER — PROMETHAZINE HCL 12.5 MG PO TABS
12.5000 mg | ORAL_TABLET | Freq: Three times a day (TID) | ORAL | 0 refills | Status: DC | PRN
  Filled 2021-08-17: qty 20, 7d supply, fill #0

## 2021-08-17 NOTE — Progress Notes (Signed)
Patient ID: Crystal Koch, female   DOB: 1980/11/11, 41 y.o.   MRN: [id number] ? ? ? ? ?Crystal Koch, is a 41 y.o. female ? ?[id number] ? ?[id number] ? ?DOB - 10/25/1980 ? ?Chief Complaint  ?Patient presents with  ? Dizziness  ? Nausea  ?    ? ?Subjective:  ? ?Crystal Koch is a 41 y.o. female here today for nausea intermittently for about 2 days.  (Nausea and dizziness since Monday along with hot flashes.) drinks about 40-60 ounces water daily.  No vomiting.  No abdominal pain.  No change is stools.  No blood in stools.  No sick contacts.  +SA.  No BC.  No CP.  No SOB.  No visions changes.   ? ?LMP march 3rd and very regular.  [facility]su 28-30 days between periods.  Mom went thru menopause in her early 69s.   ? ?No problems updated. ? ?ALLERGIES: ?No Known Allergies ? ?PAST MEDICAL HISTORY: ?Past Medical History:  ?Diagnosis Date  ? Asthma 1986  ? Sciatica 2011  ? since son was born. exacerbated every winter.   ? ? ?MEDICATIONS AT HOME: ?Prior to Admission medications   ?Medication Sig Start Date End Date Taking? Authorizing Provider  ?promethazine (PHENERGAN) 12.5 MG tablet Take 1 tablet (12.5 mg total) by mouth every 8 (eight) hours as needed for nausea or vomiting. 08/17/21  Yes Anders Simmonds, PA-C  ?chlorhexidine (PERIDEX) 0.12 % solution Use as directed 15 mLs in the mouth or throat 2 (two) times daily. ?Patient not taking: Reported on 05/31/2021 03/17/21   Gailen Shelter, PA  ?ibuprofen (ADVIL) 600 MG tablet Take 1 tablet (600 mg total) by mouth every 8 (eight) hours as needed. ?Patient not taking: Reported on 03/24/2021 03/16/21   Ivonne Andrew, NP  ?meloxicam (MOBIC) 7.5 MG tablet Take 1 tablet (7.5 mg total) by mouth daily. ?Patient not taking: Reported on 08/17/2021 02/22/21   Hoy Register, MD  ?methocarbamol (ROBAXIN) 500 MG tablet Take 1 tablet (500 mg total) by mouth every 8 (eight) hours as needed for muscle spasms. ?Patient not taking: Reported on 03/24/2021 06/19/14   Dessa Phi, MD  ?omeprazole  (PRILOSEC) 40 MG capsule Take 1 capsule (40 mg total) by mouth daily. ?Patient not taking: Reported on 05/31/2021 02/22/21   Hoy Register, MD  ?traMADol (ULTRAM) 50 MG tablet Take 1 tablet (50 mg total) by mouth every 8 (eight) hours as needed for severe pain. ?Patient not taking: Reported on 03/24/2021 06/19/14   Dessa Phi, MD  ?Vitamin D, Ergocalciferol, (DRISDOL) 50000 UNITS CAPS capsule Take 1 capsule (50,000 Units total) by mouth every 7 (seven) days. For 8 weeks. ?Patient not taking: Reported on 03/24/2021 06/22/14   Dessa Phi, MD  ? ? ?ROS: ?Neg HEENT ?Neg resp ?Neg cardiac ?Neg GU ?Neg MS ?Neg psych ? ?Objective:  ? ?Vitals:  ? 08/17/21 1121  ?BP: 135/85  ?Pulse: 71  ?SpO2: 100%  ?Weight: 165 lb 6.4 oz (75 kg)  ? ?Exam ?General appearance : Awake, alert, not in any distress. Speech Clear. Not toxic looking ?HEENT: Atraumatic and Normocephalic, pupils equally reactive to light and accomodation ?Neck: Supple, no JVD. No cervical lymphadenopathy.  ?Chest: Good air entry bilaterally, CTAB.  No rales/rhonchi/wheezing ?CVS: S1 S2 regular, no murmurs.  ?Extremities: B/L Lower Ext shows no edema, both legs are warm to touch ?Neurology: Awake alert, and oriented X 3, CN II-XII intact, Non focal ?Skin: No Rash ? ?Data Review ?Lab Results  ?Component Value Date  ? HGBA1C  5.7 (H) 03/24/2021  ? ? ?Assessment & Plan  ? ?1. Nausea ?Likely viral ?- POCT urine pregnancy ?- Comprehensive metabolic panel ?- FSH/LH ?- promethazine (PHENERGAN) 12.5 MG tablet; Take 1 tablet (12.5 mg total) by mouth every 8 (eight) hours as needed for nausea or vomiting.  Dispense: 20 tablet; Refill: 0 ? ?2. Hot flashes ?Will assess for hormone changes ?- Thyroid Panel With TSH ?- CBC with Differential/Platelet ?- FSH/LH ? ?3. Dizziness ?No red flags.  Drink 80-100 ounces water daily ?- Thyroid Panel With TSH ?- CBC with Differential/Platelet ?- FSH/LH ? ?4. Vitamin D deficiency ?- Vitamin D, 25-hydroxy ? ? ? ?Patient have been  counseled extensively about nutrition and exercise. Other issues discussed during this visit include: low cholesterol diet, weight control and daily exercise, foot care, annual eye examinations at Ophthalmology, importance of adherence with medications and regular follow-up. We also discussed long term complications of uncontrolled diabetes and hypertension.  ? ?Return if symptoms worsen or fail to improve. ? ?The patient was given clear instructions to go to ER or return to medical center if symptoms don't improve, worsen or new problems develop. The patient verbalized understanding. The patient was told to call to get lab results if they haven't heard anything in the next week.  ? ? ? ? ?Freeman Caldron, PA-C ?[place] ?[place], [place] ?[phone]   ?08/17/2021, 12:15 PM  ?

## 2021-08-17 NOTE — Patient Instructions (Signed)

## 2021-08-18 LAB — CBC WITH DIFFERENTIAL/PLATELET
Basophils Absolute: 0.1 10*3/uL (ref 0.0–0.2)
Basos: 1 %
EOS (ABSOLUTE): 0.1 10*3/uL (ref 0.0–0.4)
Eos: 1 %
Hematocrit: 44.1 % (ref 34.0–46.6)
Hemoglobin: 14.6 g/dL (ref 11.1–15.9)
Immature Grans (Abs): 0 10*3/uL (ref 0.0–0.1)
Immature Granulocytes: 0 %
Lymphocytes Absolute: 3.2 10*3/uL — ABNORMAL HIGH (ref 0.7–3.1)
Lymphs: 37 %
MCH: 28.2 pg (ref 26.6–33.0)
MCHC: 33.1 g/dL (ref 31.5–35.7)
MCV: 85 fL (ref 79–97)
Monocytes Absolute: 0.5 10*3/uL (ref 0.1–0.9)
Monocytes: 6 %
Neutrophils Absolute: 4.6 10*3/uL (ref 1.4–7.0)
Neutrophils: 55 %
Platelets: 381 10*3/uL (ref 150–450)
RBC: 5.18 x10E6/uL (ref 3.77–5.28)
RDW: 13.2 % (ref 11.7–15.4)
WBC: 8.6 10*3/uL (ref 3.4–10.8)

## 2021-08-18 LAB — COMPREHENSIVE METABOLIC PANEL
ALT: 18 IU/L (ref 0–32)
AST: 18 IU/L (ref 0–40)
Albumin/Globulin Ratio: 1.8 (ref 1.2–2.2)
Albumin: 4.4 g/dL (ref 3.8–4.8)
Alkaline Phosphatase: 68 IU/L (ref 44–121)
BUN/Creatinine Ratio: 12 (ref 9–23)
BUN: 10 mg/dL (ref 6–24)
Bilirubin Total: 0.3 mg/dL (ref 0.0–1.2)
CO2: 25 mmol/L (ref 20–29)
Calcium: 10 mg/dL (ref 8.7–10.2)
Chloride: 101 mmol/L (ref 96–106)
Creatinine, Ser: 0.83 mg/dL (ref 0.57–1.00)
Globulin, Total: 2.5 g/dL (ref 1.5–4.5)
Glucose: 80 mg/dL (ref 70–99)
Potassium: 4 mmol/L (ref 3.5–5.2)
Sodium: 140 mmol/L (ref 134–144)
Total Protein: 6.9 g/dL (ref 6.0–8.5)
eGFR: 91 mL/min/{1.73_m2} (ref >59)

## 2021-08-18 LAB — FSH/LH
FSH: 8.8 m[IU]/mL
LH: 5.6 m[IU]/mL

## 2021-08-18 LAB — THYROID PANEL WITH TSH
Free Thyroxine Index: 2 (ref 1.2–4.9)
T3 Uptake Ratio: 30 % (ref 24–39)
T4, Total: 6.5 ug/dL (ref 4.5–12.0)
TSH: 1.46 u[IU]/mL (ref 0.450–4.500)

## 2021-08-18 LAB — VITAMIN D 25 HYDROXY (VIT D DEFICIENCY, FRACTURES): Vit D, 25-Hydroxy: 72.5 ng/mL (ref 30.0–100.0)

## 2021-11-16 ENCOUNTER — Ambulatory Visit: Payer: Self-pay

## 2021-11-16 NOTE — Telephone Encounter (Signed)
  Chief Complaint: Left leg swelling and heel pain Symptoms:  Frequency: swelling for 1 week, pain for 1 year Pertinent Negatives: Patient denies fever redness Disposition: [] ED /[x] Urgent Care (no appt availability in office) / [] Appointment(In office/virtual)/ []  [place] [facility]/ [] Home Care/ [] Refused Recommended Disposition /[] [place] [place] [facility]/ []  Follow-up with PCP Additional Notes: Pt was diagnosed with plantar fascitis last year on same foot/leg. For the last 1 week foot is very swollen at the end of the work day, but resolves by morning. Swelling has not resolved today.  Reason for Disposition  [1] Thigh, calf, or ankle swelling AND [2] only 1 side  Answer Assessment - Initial Assessment Questions 1. ONSET: "When did the swelling start?" (e.g., minutes, hours, days)     1 week 2. LOCATION: "What part of the leg is swollen?"  "Are both legs swollen or just one leg?"     Left leg 3. SEVERITY: "How bad is the swelling?" (e.g., localized; mild, moderate, severe)  - Localized - small area of swelling localized to one leg  - MILD pedal edema - swelling limited to foot and ankle, pitting edema < 1/4 inch (6 mm) deep, rest and elevation eliminate most or all swelling  - MODERATE edema - swelling of lower leg to knee, pitting edema > 1/4 inch (6 mm) deep, rest and elevation only partially reduce swelling  - SEVERE edema - swelling extends above knee, facial or hand swelling present      mild 4. REDNESS: "Does the swelling look red or infected?"     no 5. PAIN: "Is the swelling painful to touch?" If Yes, ask: "How painful is it?"   (Scale 1-10; mild, moderate or severe)     Heel yes - 8-9/10 6. FEVER: "Do you have a fever?" If Yes, ask: "What is it, how was it measured, and when did it start?"      fever 7. CAUSE: "What do you think is causing the leg swelling?"     Plantar fascitis 8. MEDICAL HISTORY: "Do you have a history of heart failure, kidney disease, liver failure, or  cancer?"     no 9. RECURRENT SYMPTOM: "Have you had leg swelling before?" If Yes, ask: "When was the last time?" "What happened that time?"     no 10. OTHER SYMPTOMS: "Do you have any other symptoms?" (e.g., chest pain, difficulty breathing)       no 11. PREGNANCY: "Is there any chance you are pregnant?" "When was your last menstrual period?"       no  Protocols used: Leg Swelling and Edema-A-AH

## 2021-11-17 NOTE — Telephone Encounter (Signed)
The mobile clinic will be her best bet for an earlier appointment.  She can also be placed on the waiting list to see me.  Thanks.

## 2021-11-17 NOTE — Telephone Encounter (Signed)
Pt does have appt 9/21 do you want to see her sooner?

## 2021-11-17 NOTE — Telephone Encounter (Signed)
Called and left vm   Mobile clinic is open for appts

## 2021-11-18 NOTE — Telephone Encounter (Signed)
Called pt and she stated she will wait for upcoming appt   She also stated that she has  a couple of days off and swelling and pain are going down

## 2021-12-01 ENCOUNTER — Encounter (HOSPITAL_COMMUNITY): Payer: Self-pay

## 2021-12-01 ENCOUNTER — Emergency Department (HOSPITAL_COMMUNITY)
Admission: EM | Admit: 2021-12-01 | Discharge: 2021-12-01 | Disposition: A | Discharge status: Home / Self Care | Payer: Self-pay | Attending: Emergency Medicine | Admitting: Emergency Medicine

## 2021-12-01 ENCOUNTER — Emergency Department (HOSPITAL_COMMUNITY): Payer: Self-pay

## 2021-12-01 ENCOUNTER — Other Ambulatory Visit: Payer: Self-pay

## 2021-12-01 DIAGNOSIS — M7989 Other specified soft tissue disorders: Secondary | ICD-10-CM | POA: Insufficient documentation

## 2021-12-01 DIAGNOSIS — M79672 Pain in left foot: Secondary | ICD-10-CM | POA: Insufficient documentation

## 2021-12-01 MED ORDER — NAPROXEN 375 MG PO TABS
375.0000 mg | ORAL_TABLET | Freq: Two times a day (BID) | ORAL | 0 refills | Status: DC
  Filled 2021-12-01: qty 20, 10d supply, fill #0

## 2021-12-01 NOTE — ED Provider Notes (Signed)
[facility]-EMERGENCY DEPT Provider Note   CSN: [id number] Arrival date & time: 12/01/21  1306     History  Chief Complaint  Patient presents with   Leg Swelling   Leg Pain    Crystal Koch is a 41 y.o. female.  41 year old female presents today for evaluation of pain to her left heel along with swelling of the left foot.  Denies fever, chills, swelling of the entire leg, history of DVT, recent injury, recent travel by flight or long road trip, bedridden recently, recent surgery, or exogenous estrogen use.  Patient was unable to get in with PCP until sometime in September so was told to come to the emergency room or urgent care for eval.  Denies swelling of the leg above the ankle.  No pain over the calf.  The history is provided by the patient. No language interpreter was used.       Home Medications Prior to Admission medications   Medication Sig Start Date End Date Taking? Authorizing Provider  chlorhexidine (PERIDEX) 0.12 % solution Use as directed 15 mLs in the mouth or throat 2 (two) times daily. Patient not taking: Reported on 05/31/2021 03/17/21   Gailen Shelter, PA  ibuprofen (ADVIL) 600 MG tablet Take 1 tablet (600 mg total) by mouth every 8 (eight) hours as needed. Patient not taking: Reported on 03/24/2021 03/16/21   Ivonne Andrew, NP  meloxicam (MOBIC) 7.5 MG tablet Take 1 tablet (7.5 mg total) by mouth daily. Patient not taking: Reported on 08/17/2021 02/22/21   Hoy Register, MD  methocarbamol (ROBAXIN) 500 MG tablet Take 1 tablet (500 mg total) by mouth every 8 (eight) hours as needed for muscle spasms. Patient not taking: Reported on 03/24/2021 06/19/14   Dessa Phi, MD  omeprazole (PRILOSEC) 40 MG capsule Take 1 capsule (40 mg total) by mouth daily. Patient not taking: Reported on 05/31/2021 02/22/21   Hoy Register, MD  promethazine (PHENERGAN) 12.5 MG tablet Take 1 tablet (12.5 mg total) by mouth every 8 (eight) hours as needed for  nausea or vomiting. 08/17/21   Anders Simmonds, PA-C  traMADol (ULTRAM) 50 MG tablet Take 1 tablet (50 mg total) by mouth every 8 (eight) hours as needed for severe pain. Patient not taking: Reported on 03/24/2021 06/19/14   Dessa Phi, MD  Vitamin D, Ergocalciferol, (DRISDOL) 50000 UNITS CAPS capsule Take 1 capsule (50,000 Units total) by mouth every 7 (seven) days. For 8 weeks. Patient not taking: Reported on 03/24/2021 06/22/14   Dessa Phi, MD      Allergies    Patient has no known allergies.    Review of Systems   Review of Systems  Constitutional:  Negative for chills and fever.  Respiratory:  Negative for shortness of breath.   Cardiovascular:  Negative for chest pain and leg swelling.  Musculoskeletal:  Positive for arthralgias. Negative for joint swelling.  All other systems reviewed and are negative.   Physical Exam Updated Vital Signs BP 128/81   Pulse 70   Temp 98.5 F (36.9 C) (Oral)   Resp 18   Ht 5\' 3"  (1.6 m)   Wt 68 kg   SpO2 96%   BMI 26.57 kg/m  Physical Exam Vitals and nursing note reviewed.  Constitutional:      General: She is not in acute distress.    Appearance: Normal appearance. She is not ill-appearing.  HENT:     Head: Normocephalic and atraumatic.     Nose: Nose normal.  Eyes:     Conjunctiva/sclera: Conjunctivae normal.  Pulmonary:     Effort: Pulmonary effort is normal. No respiratory distress.  Musculoskeletal:        General: No deformity.     Right lower leg: No edema.     Left lower leg: No edema.     Comments: Without visible swelling or deformity of the left lower extremity.  Left heel with mild tenderness to palpation.  Left calf without tenderness to palpation.  Without tenderness to palpation over the left calf.  2+ DP pulse present.  Brisk cap refill.  Full range of motion of the left ankle.  Ankle joint without swelling.  Patient is able to stand and ambulate without difficulty.  Skin:    Findings: No rash.   Neurological:     Mental Status: She is alert.     ED Results / Procedures / Treatments   Labs (all labs ordered are listed, but only abnormal results are displayed) Labs Reviewed - No data to display  EKG None  Radiology DG Foot Complete Left  Result Date: 12/01/2021 CLINICAL DATA:  Pain and swelling EXAM: LEFT FOOT - COMPLETE 3+ VIEW COMPARISON:  None Available. FINDINGS: No fracture or dislocation is seen. There are possible tiny bony spurs in the first metatarsophalangeal joint. IMPRESSION: No fracture or dislocation is seen in the left foot. Electronically Signed   By: Ernie Avena M.D.   On: 12/01/2021 13:54    Procedures Procedures    Medications Ordered in ED Medications - No data to display  ED Course/ Medical Decision Making/ A&P                           Medical Decision Making Amount and/or Complexity of Data Reviewed Radiology: ordered.   41 year old female presents today for evaluation of pain to her left heel, and occasional swelling to the left foot.  Does have history of plantar fasciitis.  She was unable to get it with her PCP.  X-ray demonstrates no bony abnormality or soft tissue swelling.  Patient is low risk of DVT on Wells criteria.  Discussed her low risk of DVT, and deferring further ultrasound imaging to evaluate for DVT.  Both patient and husband are in agreement with this.  We will provide patient with naproxen and referral to podiatry.  Patient voices understanding and is in agreement with this plan.  Return precautions discussed.   Final Clinical Impression(s) / ED Diagnoses Final diagnoses:  Left foot pain    Rx / DC Orders ED Discharge Orders          Ordered    naproxen (NAPROSYN) 375 MG tablet  2 times daily        12/01/21 1437              Marita Kansas, PA-C 12/01/21 1439    Jacalyn Lefevre, MD 12/10/21 1459

## 2021-12-01 NOTE — Discharge Instructions (Addendum)
Your exam today was overall reassuring.  X-ray did not show any concerning findings.  As we discussed you are at low risk for DVT.  I have started you on naproxen and given you a referral to podiatry.  I recommend scheduling this follow-up.  If you have any worsening symptoms please return for evaluation.

## 2021-12-01 NOTE — ED Triage Notes (Addendum)
Patient reports that she has had let leg swelling and pain x 2 weeks. Pain is worse with weight bearing.

## 2021-12-01 NOTE — ED Provider Triage Note (Signed)
Emergency Medicine Provider Triage Evaluation Note  Crystal Koch , a 41 y.o. female  was evaluated in triage.  Pt complains of left heel pain and foot swelling.  States she has history of plantar fasciitis.  States she typically gets swelling in her feet however after soaking and elevating the swelling typically goes away.  States over the past few days pain has been worse.  Was unable to get seen by primary care provider.  Does not have swelling of the calf or pain in this region.  Swelling is only localized to the foot.  Low risk of DVT per Wells criteria.   Review of Systems  Positive: As above  Negative: As above  Physical Exam  BP 128/81   Pulse 70   Temp 98.5 F (36.9 C) (Oral)   Resp 18   Ht 5\' 3"  (1.6 m)   Wt 68 kg   SpO2 96%   BMI 26.57 kg/m  Gen:   Awake, no distress   Resp:  Normal effort  MSK:   Moves extremities without difficulty  Other:    Medical Decision Making  Medically screening exam initiated at 1:37 PM.  Appropriate orders placed.  Rejeana Fadness was informed that the remainder of the evaluation will be completed by another provider, this initial triage assessment does not replace that evaluation, and the importance of remaining in the ED until their evaluation is complete.     [name], PA-C 12/01/21 1339

## 2021-12-21 ENCOUNTER — Other Ambulatory Visit: Payer: Self-pay

## 2021-12-21 ENCOUNTER — Other Ambulatory Visit: Payer: Self-pay | Admitting: Family Medicine

## 2021-12-21 MED ORDER — NAPROXEN 375 MG PO TABS
375.0000 mg | ORAL_TABLET | Freq: Two times a day (BID) | ORAL | 0 refills | Status: DC
  Filled 2021-12-21: qty 20, 10d supply, fill #0

## 2021-12-21 NOTE — Telephone Encounter (Signed)
Requested medication (s) are due for refill today: yes  Requested medication (s) are on the active medication list: yes  Last refill:  12/01/21 #20/0  Future visit scheduled: yes  Notes to clinic:  pt meets protocol but d/t new medication and previous quant being 20 wanted to make sure wanting to refill?      Requested Prescriptions  Pending Prescriptions Disp Refills   naproxen (NAPROSYN) 375 MG tablet 20 tablet 0    Sig: Take 1 tablet (375 mg total) by mouth 2 (two) times daily.     Analgesics:  NSAIDS Failed - 12/21/2021 10:46 AM      Failed - Manual Review: Labs are only required if the patient has taken medication for more than 8 weeks.      Passed - Cr in normal range and within 360 days    Creat  Date Value Ref Range Status  06/19/2014 0.74 0.50 - 1.10 mg/dL Final   Creatinine, Ser  Date Value Ref Range Status  08/17/2021 0.83 0.57 - 1.00 mg/dL Final         Passed - HGB in normal range and within 360 days    Hemoglobin  Date Value Ref Range Status  08/17/2021 14.6 11.1 - 15.9 g/dL Final         Passed - PLT in normal range and within 360 days    Platelets  Date Value Ref Range Status  08/17/2021 381 150 - 450 x10E3/uL Final         Passed - HCT in normal range and within 360 days    Hematocrit  Date Value Ref Range Status  08/17/2021 44.1 34.0 - 46.6 % Final         Passed - eGFR is 30 or above and within 360 days    GFR, Est African American  Date Value Ref Range Status  06/19/2014 >89 mL/min Final   GFR, Est Non African American  Date Value Ref Range Status  06/19/2014 >89 mL/min Final    Comment:      The estimated GFR is a calculation valid for adults (>=28 years old) that uses the CKD-EPI algorithm to adjust for age and sex. It is   not to be used for children, pregnant women, hospitalized patients,    patients on dialysis, or with rapidly changing kidney function. According to the NKDEP, eGFR >89 is normal, 60-89 shows mild impairment,  30-59 shows moderate impairment, 15-29 shows severe impairment and <15 is ESRD.      eGFR  Date Value Ref Range Status  08/17/2021 91 >59 mL/min/1.73 Final         Passed - Patient is not pregnant      Passed - Valid encounter within last 12 months    Recent Outpatient Visits           4 months ago Nausea   [place], [place], [place]   6 months ago Sore throat   [facility] at [facility], Kriste Basque, NP   9 months ago Annual physical exam   [place], Enobong, MD   9 months ago Dental abscess   [facility] Care at [facility] [place] [facility], Kriste Basque, NP   10 months ago Plantar fasciitis of left foot   [place], Enobong, MD       Future Appointments             In  2 months Charlott Rakes, MD [place]

## 2021-12-21 NOTE — Telephone Encounter (Signed)
Medication Refill - Medication:  naproxen (NAPROSYN) 375 MG tablet   Has the patient contacted their pharmacy? No. (Agent: If no, request that the patient contact the pharmacy for the refill. If patient does not wish to contact the pharmacy document the reason why and proceed with request.) (Agent: If yes, when and what did the pharmacy advise?)  Preferred Pharmacy (with phone number or street name): [facility] at [facility] Has the patient been seen for an appointment in the last year OR does the patient have an upcoming appointment? Yes.    Agent: Please be advised that RX refills may take up to 3 business days. We ask that you follow-up with your pharmacy.

## 2021-12-23 ENCOUNTER — Other Ambulatory Visit: Payer: Self-pay

## 2022-02-06 ENCOUNTER — Other Ambulatory Visit: Payer: Self-pay

## 2022-02-06 ENCOUNTER — Ambulatory Visit: Payer: Self-pay

## 2022-02-06 MED ORDER — NAPROXEN 375 MG PO TABS
375.0000 mg | ORAL_TABLET | Freq: Two times a day (BID) | ORAL | 0 refills | Status: DC
  Filled 2022-02-06: qty 60, 30d supply, fill #0
  Filled 2022-04-05: qty 60, 30d supply, fill #1

## 2022-02-06 NOTE — Telephone Encounter (Signed)
Requested Prescriptions  Pending Prescriptions Disp Refills  . naproxen (NAPROSYN) 375 MG tablet 180 tablet 0    Sig: Take 1 tablet (375 mg total) by mouth 2 (two) times daily.     Analgesics:  NSAIDS Failed - 02/06/2022 12:16 PM      Failed - Manual Review: Labs are only required if the patient has taken medication for more than 8 weeks.      Passed - Cr in normal range and within 360 days    Creat  Date Value Ref Range Status  06/19/2014 0.74 0.50 - 1.10 mg/dL Final   Creatinine, Ser  Date Value Ref Range Status  08/17/2021 0.83 0.57 - 1.00 mg/dL Final         Passed - HGB in normal range and within 360 days    Hemoglobin  Date Value Ref Range Status  08/17/2021 14.6 11.1 - 15.9 g/dL Final         Passed - PLT in normal range and within 360 days    Platelets  Date Value Ref Range Status  08/17/2021 381 150 - 450 x10E3/uL Final         Passed - HCT in normal range and within 360 days    Hematocrit  Date Value Ref Range Status  08/17/2021 44.1 34.0 - 46.6 % Final         Passed - eGFR is 30 or above and within 360 days    GFR, Est African American  Date Value Ref Range Status  06/19/2014 >89 mL/min Final   GFR, Est Non African American  Date Value Ref Range Status  06/19/2014 >89 mL/min Final    Comment:      The estimated GFR is a calculation valid for adults (>=58 years old) that uses the CKD-EPI algorithm to adjust for age and sex. It is   not to be used for children, pregnant women, hospitalized patients,    patients on dialysis, or with rapidly changing kidney function. According to the NKDEP, eGFR >89 is normal, 60-89 shows mild impairment, 30-59 shows moderate impairment, 15-29 shows severe impairment and <15 is ESRD.      eGFR  Date Value Ref Range Status  08/17/2021 91 >59 mL/min/1.73 Final         Passed - Patient is not pregnant      Passed - Valid encounter within last 12 months    Recent Outpatient Visits          5 months ago Nausea    [place], [place]   8 months ago Sore throat   Primary Care at [facility], Kriste Basque, NP   10 months ago Annual physical exam   [place], Enobong, MD   10 months ago Dental abscess   Primary Care at [facility], Kriste Basque, NP   11 months ago Plantar fasciitis of left foot   [place], Enobong, MD      Future Appointments            In 3 weeks Charlott Rakes, MD [place]

## 2022-02-06 NOTE — Telephone Encounter (Signed)
  Chief Complaint: plan B Symptoms: wanting to take to prevent pregnancy  Frequency:  Pertinent Negatives: NA Disposition: [] ED /[] Urgent Care (no appt availability in office) / [] Appointment(In office/virtual)/ []  [place]/ [x] Home Care/ [] Refused Recommended Disposition /[] [place] [facility]/ []  Follow-up with PCP Additional Notes: advised pt for Plan B she can get OTC from pharmacy. Called [facility] but they dont have in stock. Advised pt of this and told her to call local pharmacies to see if they have in stock to just make sure she doesn't go over 72 hr window from sex. Pt also wanting refill on naproxen so advised I would send to provider for review. No further assistance needed.   Summary: planned B For Pregnancy   Pt called asking if Dr does Plan b for pregnancy.  She also ask if she could get a refill for Naproxin 375 mg   CB@  [phone]      Reason for Disposition  [1] Caller has medicine question about med NOT prescribed by PCP AND [2] triager unable to answer question (e.g., compatibility with other med, storage)  Answer Assessment - Initial Assessment Questions 1. NAME of MEDICINE: "What medicine(s) are you calling about?"     Plan B 2. QUESTION: "What is your question?" (e.g., double dose of medicine, side effect)     Wanting to know if Dr prescribed  3. PRESCRIBER: "Who prescribed the medicine?" Reason: if prescribed by specialist, call should be referred to that group.     NA 4. SYMPTOMS: "Do you have any symptoms?" If Yes, ask: "What symptoms are you having?"  "How bad are the symptoms (e.g., mild, moderate, severe)     Wanting to take for prevention of pregnancy  Protocols used: Medication Question Call-A-AH

## 2022-02-09 ENCOUNTER — Other Ambulatory Visit: Payer: Self-pay

## 2022-03-02 ENCOUNTER — Ambulatory Visit: Payer: Self-pay | Attending: Family Medicine | Admitting: Family Medicine

## 2022-03-02 ENCOUNTER — Encounter: Payer: Self-pay | Admitting: Family Medicine

## 2022-03-02 ENCOUNTER — Other Ambulatory Visit: Payer: Self-pay

## 2022-03-02 VITALS — BP 126/79 | HR 80 | Temp 98.7°F | Ht 62.5 in | Wt 153.8 lb

## 2022-03-02 DIAGNOSIS — H538 Other visual disturbances: Secondary | ICD-10-CM

## 2022-03-02 DIAGNOSIS — M722 Plantar fascial fibromatosis: Secondary | ICD-10-CM

## 2022-03-02 DIAGNOSIS — N939 Abnormal uterine and vaginal bleeding, unspecified: Secondary | ICD-10-CM

## 2022-03-02 DIAGNOSIS — M5432 Sciatica, left side: Secondary | ICD-10-CM

## 2022-03-02 DIAGNOSIS — Z131 Encounter for screening for diabetes mellitus: Secondary | ICD-10-CM

## 2022-03-02 MED ORDER — PREDNISONE 20 MG PO TABS
20.0000 mg | ORAL_TABLET | Freq: Every day | ORAL | 0 refills | Status: DC
  Filled 2022-03-02: qty 5, 5d supply, fill #0

## 2022-03-02 MED ORDER — DULOXETINE HCL 60 MG PO CPEP
60.0000 mg | ORAL_CAPSULE | Freq: Every day | ORAL | 3 refills | Status: DC
  Filled 2022-03-02: qty 30, 30d supply, fill #0

## 2022-03-02 NOTE — Patient Instructions (Signed)
Plantar Fasciitis  Plantar fasciitis is a painful foot condition that affects the heel. It occurs when the band of tissue that connects the toes to the heel bone (plantar fascia) becomes irritated. This can happen as the result of exercising too much or doing other repetitive activities (overuse injury). Plantar fasciitis can cause mild irritation to severe pain that makes it difficult to walk or move. The pain is usually worse in the morning after sleeping, or after sitting or lying down for a period of time. Pain may also be worse after long periods of walking or standing. What are the causes? This condition may be caused by: Standing for long periods of time. Wearing shoes that do not have good arch support. Doing activities that put stress on joints (high-impact activities). This includes ballet and exercise that makes your heart beat faster (aerobic exercise), such as running. Being overweight. An abnormal way of walking (gait). Tight muscles in the back of your lower leg (calf). High arches in your feet or flat feet. Starting a new athletic activity. What are the signs or symptoms? The main symptom of this condition is heel pain. Pain may get worse after the following: Taking the first steps after a time of rest, especially in the morning after awakening, or after you have been sitting or lying down for a while. Long periods of standing still. Pain may decrease after 30-45 minutes of activity, such as gentle walking. How is this diagnosed? This condition may be diagnosed based on your medical history, a physical exam, and your symptoms. Your health care provider will check for: A tender area on the bottom of your foot. A high arch in your foot or flat feet. Pain when you move your foot. Difficulty moving your foot. You may have imaging tests to confirm the diagnosis, such as: X-rays. Ultrasound. MRI. How is this treated? Treatment for plantar fasciitis depends on how severe your  condition is. Treatment may include: Rest, ice, pressure (compression), and raising (elevating) the affected foot. This is called RICE therapy. Your health care provider may recommend RICE therapy along with over-the-counter pain medicines to manage your pain. Exercises to stretch your calves and your plantar fascia. A splint that holds your foot in a stretched, upward position while you sleep (night splint). Physical therapy to relieve symptoms and prevent problems in the future. Injections of steroid medicine (cortisone) to relieve pain and inflammation. Stimulating your plantar fascia with electrical impulses (extracorporeal shock wave therapy). This is usually the last treatment option before surgery. Surgery, if other treatments have not worked after 12 months. Follow these instructions at home: Managing pain, stiffness, and swelling  If directed, put ice on the painful area. To do this: Put ice in a plastic bag, or use a frozen bottle of water. Place a towel between your skin and the bag or bottle. Roll the bottom of your foot over the bag or bottle. Do this for 20 minutes, 2-3 times a day. Wear athletic shoes that have air-sole or gel-sole cushions, or try soft shoe inserts that are designed for plantar fasciitis. Elevate your foot above the level of your heart while you are sitting or lying down. Activity Avoid activities that cause pain. Ask your health care provider what activities are safe for you. Do physical therapy exercises and stretches as told by your health care provider. Try activities and forms of exercise that are easier on your joints (low impact). Examples include swimming, water aerobics, and biking. General instructions Take over-the-counter   and prescription medicines only as told by your health care provider. Wear a night splint while sleeping, if told by your health care provider. Loosen the splint if your toes tingle, become numb, or turn cold and blue. Maintain a  healthy weight, or work with your health care provider to lose weight as needed. Keep all follow-up visits. This is important. Contact a health care provider if you have: Symptoms that do not go away with home treatment. Pain that gets worse. Pain that affects your ability to move or do daily activities. Summary Plantar fasciitis is a painful foot condition that affects the heel. It occurs when the band of tissue that connects the toes to the heel bone (plantar fascia) becomes irritated. Heel pain is the main symptom of this condition. It may get worse after exercising too much or standing still for a long time. Treatment varies, but it usually starts with rest, ice, pressure (compression), and raising (elevating) the affected foot. This is called RICE therapy. Over-the-counter medicines can also be used to manage pain. This information is not intended to replace advice given to you by your health care provider. Make sure you discuss any questions you have with your health care provider. Document Revised: 09/15/2019 Document Reviewed: 09/15/2019 Elsevier Patient Education  2023 [employer]  

## 2022-03-02 NOTE — Progress Notes (Signed)
Subjective:  Patient ID: Crystal Koch, female    DOB: Oct 19, 1980  Age: 41 y.o. MRN: [id number]  CC: Leg Pain   HPI Crystal Koch is a 41 y.o. year old female who presents for an acute visit.  Interval History:  Pain feel like a pinch from her left posterior thigh and radiates to her left heel with her heel sore to touch. Symptoms are worse after she gets off work and sometimes is throbbing. Symptoms have been present for the last 4 months and previously responded to Ibuprofen but now she does not have any relief with Ibuprofen.  She Complains of excessive menstrual bleeding with associated weakness and fatigue last week and this was a first for her. Her cycles are usually regular with light flow until this last time and she did have excessive abdominal cramping as well.   C/o blurry vision and sometimes has headache.  Does not use glasses and has not been to see an ophthalmologist. Past Medical History:  Diagnosis Date   Asthma 1986   Sciatica 2011   since son was born. exacerbated every winter.     Past Surgical History:  Procedure Laterality Date   CESAREAN SECTION  2011    Family History  Problem Relation Age of Onset   Cancer Maternal Grandmother    Cancer Father    Thyroid disease Mother    Hypertension Maternal Uncle    Diabetes Neg Hx     Social History   Socioeconomic History   Marital status: Single    Spouse name: Not on file   Number of children: 1   Years of education: college    Highest education level: Not on file  Occupational History   Occupation: Homemaker   Tobacco Use   Smoking status: Every Day    Packs/day: 0.50    Years: 16.00    Total pack years: 8.00    Types: Cigarettes   Smokeless tobacco: Never  Vaping Use   Vaping Use: Never used  Substance and Sexual Activity   Alcohol use: No   Drug use: Yes    Types: Marijuana   Sexual activity: Yes    Birth control/protection: None  Other Topics Concern   Not on file  Social History  Narrative   Lives at home with 4 yo son.    Social Determinants of Health   Financial Resource Strain: Not on file  Food Insecurity: Not on file  Transportation Needs: Not on file  Physical Activity: Not on file  Stress: Not on file  Social Connections: Not on file    No Known Allergies  Outpatient Medications Prior to Visit  Medication Sig Dispense Refill   naproxen (NAPROSYN) 375 MG tablet Take 1 tablet (375 mg total) by mouth 2 (two) times daily. 180 tablet 0   methocarbamol (ROBAXIN) 500 MG tablet Take 1 tablet (500 mg total) by mouth every 8 (eight) hours as needed for muscle spasms. (Patient not taking: Reported on 03/24/2021) 60 tablet 1   chlorhexidine (PERIDEX) 0.12 % solution Use as directed 15 mLs in the mouth or throat 2 (two) times daily. (Patient not taking: Reported on 05/31/2021) 473 mL 0   ibuprofen (ADVIL) 600 MG tablet Take 1 tablet (600 mg total) by mouth every 8 (eight) hours as needed. (Patient not taking: Reported on 03/24/2021) 30 tablet 0   omeprazole (PRILOSEC) 40 MG capsule Take 1 capsule (40 mg total) by mouth daily. (Patient not taking: Reported on 05/31/2021) 30 capsule 3  promethazine (PHENERGAN) 12.5 MG tablet Take 1 tablet (12.5 mg total) by mouth every 8 (eight) hours as needed for nausea or vomiting. (Patient not taking: Reported on 03/02/2022) 20 tablet 0   traMADol (ULTRAM) 50 MG tablet Take 1 tablet (50 mg total) by mouth every 8 (eight) hours as needed for severe pain. (Patient not taking: Reported on 03/24/2021) 45 tablet 0   Vitamin D, Ergocalciferol, (DRISDOL) 50000 UNITS CAPS capsule Take 1 capsule (50,000 Units total) by mouth every 7 (seven) days. For 8 weeks. (Patient not taking: Reported on 03/24/2021) 8 capsule 0   No facility-administered medications prior to visit.     ROS Review of Systems  Constitutional:  Negative for activity change and appetite change.  HENT:  Negative for sinus pressure and sore throat.   Eyes:  Positive for  visual disturbance.  Respiratory:  Negative for chest tightness, shortness of breath and wheezing.   Cardiovascular:  Negative for chest pain and palpitations.  Gastrointestinal:  Negative for abdominal distention, abdominal pain and constipation.  Genitourinary:  Positive for menstrual problem.  Musculoskeletal:        See HPI  Psychiatric/Behavioral:  Negative for behavioral problems and dysphoric mood.     Objective:  BP 126/79   Pulse 80   Temp 98.7 F (37.1 C) (Oral)   Ht 5' 2.5" (1.588 m)   Wt 153 lb 12.8 oz (69.8 kg)   SpO2 100%   BMI 27.68 kg/m      03/02/2022    9:24 AM 12/01/2021    2:45 PM 12/01/2021    1:30 PM  BP/Weight  Systolic BP 595 638 756  Diastolic BP 79 86 82  Wt. (Lbs) 153.8    BMI 27.68 kg/m2        Physical Exam Constitutional:      Appearance: She is well-developed.  Cardiovascular:     Rate and Rhythm: Normal rate.     Heart sounds: Normal heart sounds. No murmur heard. Pulmonary:     Effort: Pulmonary effort is normal.     Breath sounds: Normal breath sounds. No wheezing or rales.  Chest:     Chest wall: No tenderness.  Abdominal:     General: Bowel sounds are normal. There is no distension.     Palpations: Abdomen is soft. There is no mass.     Tenderness: There is no abdominal tenderness.  Musculoskeletal:        General: Normal range of motion.     Right lower leg: No edema.     Left lower leg: No edema.     Comments: Positive straight leg raise on the left  Neurological:     Mental Status: She is alert and oriented to person, place, and time.     Sensory: Sensory deficit (decreased sensation in left leg compared with right) present.     Gait: Gait is intact.     Deep Tendon Reflexes:     Reflex Scores:      Patellar reflexes are 2+ on the right side and 3+ on the left side. Psychiatric:        Mood and Affect: Mood normal.        Latest Ref Rng & Units 08/17/2021   11:45 AM 03/24/2021   10:59 AM 06/19/2014    9:54 AM  CMP   Glucose 70 - 99 mg/dL 80  74  85   BUN 6 - 24 mg/dL 10  10  7    Creatinine 0.57 - 1.00  mg/dL 1.610.83  0.960.69  0.450.74   Sodium 134 - 144 mmol/L 140  142  139   Potassium 3.5 - 5.2 mmol/L 4.0  3.9  4.7   Chloride 96 - 106 mmol/L 101  104  105   CO2 20 - 29 mmol/L 25  24  29    Calcium 8.7 - 10.2 mg/dL 40.910.0  9.3  9.5   Total Protein 6.0 - 8.5 g/dL 6.9  6.8  5.8   Total Bilirubin 0.0 - 1.2 mg/dL 0.3  <8.1<0.2  0.3   Alkaline Phos 44 - 121 IU/L 68  65  43   AST 0 - 40 IU/L 18  22  15    ALT 0 - 32 IU/L 18  27  13      Lipid Panel     Component Value Date/Time   CHOL 171 03/24/2021 1059   TRIG 68 03/24/2021 1059   HDL 34 (L) 03/24/2021 1059   CHOLHDL 5.0 (H) 03/24/2021 1059   LDLCALC 124 (H) 03/24/2021 1059    CBC    Component Value Date/Time   WBC 8.6 08/17/2021 1145   WBC 6.7 06/19/2014 0954   RBC 5.18 08/17/2021 1145   RBC 4.55 06/19/2014 0954   HGB 14.6 08/17/2021 1145   HCT 44.1 08/17/2021 1145   PLT 381 08/17/2021 1145   MCV 85 08/17/2021 1145   MCH 28.2 08/17/2021 1145   MCH 28.8 06/19/2014 0954   MCHC 33.1 08/17/2021 1145   MCHC 33.8 06/19/2014 0954   RDW 13.2 08/17/2021 1145   LYMPHSABS 3.2 (H) 08/17/2021 1145   EOSABS 0.1 08/17/2021 1145   BASOSABS 0.1 08/17/2021 1145    Lab Results  Component Value Date   HGBA1C 5.7 (H) 03/24/2021    Lab Results  Component Value Date   TSH 1.460 08/17/2021    Assessment & Plan:  1. Sciatica of left side Uncontrolled In the setting of slight left lower extremity sensory loss and hyperreflexia of the left patellar reflex I will order an MRI of her lumbar spine - DULoxetine (CYMBALTA) 60 MG capsule; Take 1 capsule (60 mg total) by mouth daily. For sciatica pain  Dispense: 30 capsule; Refill: 3 - Ambulatory referral to Physical Therapy - DG Lumbar Spine Complete; Future - MR Lumbar Spine Wo Contrast; Future  2. Abnormal uterine bleeding She has had one episode of abnormal uterine bleed Advised to keep a menstrual diary and if  symptoms recur consider transvaginal and pelvic ultrasound - Basic Metabolic Panel - CBC with Differential/Platelet  3. Blurry vision, bilateral We will need to screen for diabetes mellitus Advised to seek services of an ophthalmologist especially in the setting of headache  4. Plantar fasciitis Uncontrolled - predniSONE (DELTASONE) 20 MG tablet; Take 1 tablet (20 mg total) by mouth daily with breakfast.  Dispense: 5 tablet; Refill: 0 - Ambulatory referral to Podiatry  5. Screening for diabetes mellitus - Hemoglobin A1c    Meds ordered this encounter  Medications   DULoxetine (CYMBALTA) 60 MG capsule    Sig: Take 1 capsule (60 mg total) by mouth daily. For sciatica pain    Dispense:  30 capsule    Refill:  3   predniSONE (DELTASONE) 20 MG tablet    Sig: Take 1 tablet (20 mg total) by mouth daily with breakfast.    Dispense:  5 tablet    Refill:  0    Follow-up: Return in about 3 months (around 06/01/2022) for Sciatica and plantar fasciitis.  Hoy Register, MD, FAAFP. [facility] [place], [place] [phone]   03/02/2022, 1:04 PM

## 2022-03-02 NOTE — Progress Notes (Signed)
Having pain in left leg Heavy bleeding last menstrual.

## 2022-03-03 LAB — CBC WITH DIFFERENTIAL/PLATELET
Basophils Absolute: 0.1 10*3/uL (ref 0.0–0.2)
Basos: 1 %
EOS (ABSOLUTE): 0.2 10*3/uL (ref 0.0–0.4)
Eos: 3 %
Hematocrit: 44.1 % (ref 34.0–46.6)
Hemoglobin: 14.3 g/dL (ref 11.1–15.9)
Immature Grans (Abs): 0 10*3/uL (ref 0.0–0.1)
Immature Granulocytes: 0 %
Lymphocytes Absolute: 3.3 10*3/uL — ABNORMAL HIGH (ref 0.7–3.1)
Lymphs: 39 %
MCH: 27.8 pg (ref 26.6–33.0)
MCHC: 32.4 g/dL (ref 31.5–35.7)
MCV: 86 fL (ref 79–97)
Monocytes Absolute: 0.5 10*3/uL (ref 0.1–0.9)
Monocytes: 6 %
Neutrophils Absolute: 4.3 10*3/uL (ref 1.4–7.0)
Neutrophils: 51 %
Platelets: 379 10*3/uL (ref 150–450)
RBC: 5.15 x10E6/uL (ref 3.77–5.28)
RDW: 13.4 % (ref 11.7–15.4)
WBC: 8.4 10*3/uL (ref 3.4–10.8)

## 2022-03-03 LAB — BASIC METABOLIC PANEL
BUN/Creatinine Ratio: 19 (ref 9–23)
BUN: 14 mg/dL (ref 6–24)
CO2: 23 mmol/L (ref 20–29)
Calcium: 9 mg/dL (ref 8.7–10.2)
Chloride: 104 mmol/L (ref 96–106)
Creatinine, Ser: 0.74 mg/dL (ref 0.57–1.00)
Glucose: 75 mg/dL (ref 70–99)
Potassium: 4 mmol/L (ref 3.5–5.2)
Sodium: 141 mmol/L (ref 134–144)
eGFR: 104 mL/min/{1.73_m2} (ref >59)

## 2022-03-03 LAB — HEMOGLOBIN A1C
Est. average glucose Bld gHb Est-mCnc: 120 mg/dL
Hgb A1c MFr Bld: 5.8 % — ABNORMAL HIGH (ref 4.8–5.6)

## 2022-03-11 ENCOUNTER — Ambulatory Visit (HOSPITAL_COMMUNITY)
Admission: RE | Admit: 2022-03-11 | Discharge: 2022-03-11 | Disposition: A | Discharge status: Home / Self Care | Payer: Self-pay | Source: Ambulatory Visit | Attending: Family Medicine | Admitting: Family Medicine

## 2022-03-11 DIAGNOSIS — M5432 Sciatica, left side: Secondary | ICD-10-CM

## 2022-03-15 ENCOUNTER — Other Ambulatory Visit: Payer: Self-pay | Admitting: Family Medicine

## 2022-03-15 DIAGNOSIS — M5136 Other intervertebral disc degeneration, lumbar region: Secondary | ICD-10-CM

## 2022-04-05 ENCOUNTER — Other Ambulatory Visit: Payer: Self-pay

## 2022-05-30 ENCOUNTER — Other Ambulatory Visit: Payer: Self-pay

## 2022-06-01 ENCOUNTER — Other Ambulatory Visit (HOSPITAL_COMMUNITY)
Admission: RE | Admit: 2022-06-01 | Discharge: 2022-06-01 | Disposition: A | Discharge status: Home / Self Care | Payer: Self-pay | Source: Ambulatory Visit | Attending: Family Medicine | Admitting: Family Medicine

## 2022-06-01 ENCOUNTER — Encounter: Payer: Self-pay | Admitting: Family Medicine

## 2022-06-01 ENCOUNTER — Other Ambulatory Visit: Payer: Self-pay

## 2022-06-01 ENCOUNTER — Ambulatory Visit: Payer: Self-pay | Attending: Family Medicine | Admitting: Family Medicine

## 2022-06-01 VITALS — BP 132/85 | HR 87 | Temp 99.0°F | Ht 62.0 in | Wt 168.8 lb

## 2022-06-01 DIAGNOSIS — Z136 Encounter for screening for cardiovascular disorders: Secondary | ICD-10-CM

## 2022-06-01 DIAGNOSIS — N898 Other specified noninflammatory disorders of vagina: Secondary | ICD-10-CM

## 2022-06-01 DIAGNOSIS — Z13220 Encounter for screening for lipoid disorders: Secondary | ICD-10-CM

## 2022-06-01 DIAGNOSIS — M5432 Sciatica, left side: Secondary | ICD-10-CM

## 2022-06-01 DIAGNOSIS — M722 Plantar fascial fibromatosis: Secondary | ICD-10-CM

## 2022-06-01 DIAGNOSIS — R1084 Generalized abdominal pain: Secondary | ICD-10-CM

## 2022-06-01 MED ORDER — OMEPRAZOLE 40 MG PO CPDR
40.0000 mg | DELAYED_RELEASE_CAPSULE | Freq: Every day | ORAL | 1 refills | Status: DC
  Filled 2022-06-01: qty 30, 30d supply, fill #0

## 2022-06-01 MED ORDER — METHOCARBAMOL 500 MG PO TABS
500.0000 mg | ORAL_TABLET | Freq: Three times a day (TID) | ORAL | 3 refills | Status: DC | PRN
  Filled 2022-06-01: qty 60, 20d supply, fill #0
  Filled 2022-08-30: qty 60, 20d supply, fill #1
  Filled 2022-12-02: qty 60, 20d supply, fill #2

## 2022-06-01 NOTE — Patient Instructions (Signed)
Plantar Fasciitis Rehab Ask your health care provider which exercises are safe for you. Do exercises exactly as told by your health care provider and adjust them as directed. It is normal to feel mild stretching, pulling, tightness, or discomfort as you do these exercises. Stop right away if you feel sudden pain or your pain gets worse. Do not begin these exercises until told by your health care provider. Stretching and range-of-motion exercises These exercises warm up your muscles and joints and improve the movement and flexibility of your foot. These exercises also help to relieve pain. Plantar fascia stretch  Sit with your left / right leg crossed over your opposite knee. Hold your heel with one hand with that thumb near your arch. With your other hand, hold your toes and gently pull them back toward the top of your foot. You should feel a stretch on the base (bottom) of your toes, or the bottom of your foot (plantar fascia), or both. Hold this stretch for__________ seconds. Slowly release your toes and return to the starting position. Repeat __________ times. Complete this exercise __________ times a day. Gastrocnemius stretch, standing This exercise is also called a calf (gastroc) stretch. It stretches the muscles in the back of the upper calf. Stand with your hands against a wall. Extend your left / right leg behind you, and bend your front knee slightly. Keeping your heels on the floor, your toes facing forward, and your back knee straight, shift your weight toward the wall. Do not arch your back. You should feel a gentle stretch in your upper calf. Hold this position for __________ seconds. Repeat __________ times. Complete this exercise __________ times a day. Soleus stretch, standing This exercise is also called a calf (soleus) stretch. It stretches the muscles in the back of the lower calf. Stand with your hands against a wall. Extend your left / right leg behind you, and bend your  front knee slightly. Keeping your heels on the floor and your toes facing forward, bend your back knee and shift your weight slightly over your back leg. You should feel a gentle stretch deep in your lower calf. Hold this position for __________ seconds. Repeat __________ times. Complete this exercise __________ times a day. Gastroc and soleus stretch, standing step This exercise stretches the muscles in the back of the lower leg. These muscles are in the upper calf (gastrocnemius) and the lower calf (soleus). Stand with the ball of your left / right foot on the front of a step. The ball of your foot is on the walking surface, right under your toes. Keep your other foot firmly on the same step. Hold on to the wall or a railing for balance. Slowly lift your other foot, allowing your body weight to press your heel down over the edge of the front of the step. Keep knee straight and unbent. You should feel a stretch in your calf. Hold this position for __________ seconds. Return both feet to the step. Repeat this exercise with a slight bend in your left / right knee. Repeat __________ times with your left / right knee straight and __________ times with your left / right knee bent. Complete this exercise __________ times a day. Balance exercise This exercise builds your balance and strength control of your arch to help take pressure off your plantar fascia. Single leg stand If this exercise is too easy, you can try it with your eyes closed or while standing on a pillow. Without shoes, stand near a   railing or in a doorway. You may hold on to the railing or door frame as needed. Stand on your left / right foot. Keep your big toe down on the floor and lift the arch of your foot. You should feel a stretch across the bottom of your foot and your arch. Do not let your foot roll inward. Hold this position for __________ seconds. Repeat __________ times. Complete this exercise __________ times a day. This  information is not intended to replace advice given to you by your health care provider. Make sure you discuss any questions you have with your health care provider. Document Revised: 03/11/2020 Document Reviewed: 03/11/2020 Elsevier Patient Education  2023 [employer]vier Inc.  

## 2022-06-01 NOTE — Progress Notes (Signed)
Sharp pain in feet Burning in abdomen. Discuss PH levels.

## 2022-06-01 NOTE — Progress Notes (Signed)
Subjective:  Patient ID: Crystal Koch, female    DOB: 15-Nov-1980  Age: 41 y.o. MRN: [id number]  CC: Abdominal Pain   HPI Crystal Koch is a 41 y.o. year old female presents today for an acute visit.  Interval History:  Complains of pain in feet as her plantar fasciitis is worse and she did not have money to pay when the Podiatrist called for an appointment. She is using insoles, Naproxen with mild relief she also uses insoles. Cymbalta causes her to have nausea, diarrhea, unsettling feeling. Also has burning, sharp pains  in abdomen and burping when she has not had anything to eat. Cold water and juice provide relief.  She Complains of vaginal discharge different from typical discharge and thinks her pH might be off. Past Medical History:  Diagnosis Date   Asthma 1986   Sciatica 2011   since son was born. exacerbated every winter.     Past Surgical History:  Procedure Laterality Date   CESAREAN SECTION  2011    Family History  Problem Relation Age of Onset   Cancer Maternal Grandmother    Cancer Father    Thyroid disease Mother    Hypertension Maternal Uncle    Diabetes Neg Hx     Social History   Socioeconomic History   Marital status: Single    Spouse name: Not on file   Number of children: 1   Years of education: college    Highest education level: Not on file  Occupational History   Occupation: Homemaker   Tobacco Use   Smoking status: Every Day    Packs/day: 0.50    Years: 16.00    Total pack years: 8.00    Types: Cigarettes   Smokeless tobacco: Never  Vaping Use   Vaping Use: Never used  Substance and Sexual Activity   Alcohol use: No   Drug use: Yes    Types: Marijuana   Sexual activity: Yes    Birth control/protection: None  Other Topics Concern   Not on file  Social History Narrative   Lives at home with 4 yo son.    Social Determinants of Health   Financial Resource Strain: Not on file  Food Insecurity: Not on file  Transportation  Needs: Not on file  Physical Activity: Not on file  Stress: Not on file  Social Connections: Not on file    No Known Allergies  Outpatient Medications Prior to Visit  Medication Sig Dispense Refill   DULoxetine (CYMBALTA) 60 MG capsule Take 1 capsule (60 mg total) by mouth daily. For sciatica pain 30 capsule 3   naproxen (NAPROSYN) 375 MG tablet Take 1 tablet (375 mg total) by mouth 2 (two) times daily. 180 tablet 0   methocarbamol (ROBAXIN) 500 MG tablet Take 1 tablet (500 mg total) by mouth every 8 (eight) hours as needed for muscle spasms. 60 tablet 1   predniSONE (DELTASONE) 20 MG tablet Take 1 tablet (20 mg total) by mouth daily with breakfast. (Patient not taking: Reported on 06/01/2022) 5 tablet 0   No facility-administered medications prior to visit.     ROS Review of Systems  Constitutional:  Negative for activity change and appetite change.  HENT:  Negative for sinus pressure and sore throat.   Respiratory:  Negative for chest tightness, shortness of breath and wheezing.   Cardiovascular:  Negative for chest pain and palpitations.  Gastrointestinal:  Positive for abdominal pain. Negative for abdominal distention and constipation.  Genitourinary:  Positive for vaginal  discharge.  Musculoskeletal:        See HPI  Psychiatric/Behavioral:  Negative for behavioral problems and dysphoric mood.     Objective:  BP 132/85   Pulse 87   Temp 99 F (37.2 C)   Ht 5\' 2"  (1.575 m)   Wt 168 lb 12.8 oz (76.6 kg)   SpO2 99%   BMI 30.87 kg/m      06/01/2022   11:35 AM 06/01/2022   10:59 AM 03/02/2022    9:24 AM  BP/Weight  Systolic BP 132 159 126  Diastolic BP 85 92 79  Wt. (Lbs)  168.8 153.8  BMI  30.87 kg/m2 27.68 kg/m2      Physical Exam Constitutional:      Appearance: She is well-developed.  Cardiovascular:     Rate and Rhythm: Normal rate.     Heart sounds: Normal heart sounds. No murmur heard. Pulmonary:     Effort: Pulmonary effort is normal.     Breath  sounds: Normal breath sounds. No wheezing or rales.  Chest:     Chest wall: No tenderness.  Abdominal:     General: Bowel sounds are normal. There is no distension.     Palpations: Abdomen is soft. There is no mass.     Tenderness: There is no abdominal tenderness.  Musculoskeletal:        General: Normal range of motion.     Right lower leg: No edema.     Left lower leg: No edema.  Neurological:     Mental Status: She is alert and oriented to person, place, and time.  Psychiatric:        Mood and Affect: Mood normal.        Latest Ref Rng & Units 03/02/2022   10:15 AM 08/17/2021   11:45 AM 03/24/2021   10:59 AM  CMP  Glucose 70 - 99 mg/dL 75  80  74   BUN 6 - 24 mg/dL 14  10  10    Creatinine 0.57 - 1.00 mg/dL 03/26/2021   8.93   Sodium 134 - 144 mmol/L 141  140  142   Potassium 3.5 - 5.2 mmol/L 4.0  4.0  3.9   Chloride 96 - 106 mmol/L 104  101  104   CO2 20 - 29 mmol/L 23  25  24    Calcium 8.7 - 10.2 mg/dL 9.0  8.10  9.3   Total Protein 6.0 - 8.5 g/dL  6.9  6.8   Total Bilirubin 0.0 - 1.2 mg/dL  0.3  1.75   Alkaline Phos 44 - 121 IU/L  68  65   AST 0 - 40 IU/L  18  22   ALT 0 - 32 IU/L  18  27     Lipid Panel     Component Value Date/Time   CHOL 171 03/24/2021 1059   TRIG 68 03/24/2021 1059   HDL 34 (L) 03/24/2021 1059   CHOLHDL 5.0 (H) 03/24/2021 1059   LDLCALC 124 (H) 03/24/2021 1059    CBC    Component Value Date/Time   WBC 8.4 03/02/2022 1015   WBC 6.7 06/19/2014 0954   RBC 5.15 03/02/2022 1015   RBC 4.55 06/19/2014 0954   HGB 14.3 03/02/2022 1015   HCT 44.1 03/02/2022 1015   PLT 379 03/02/2022 1015   MCV 86 03/02/2022 1015   MCH 27.8 03/02/2022 1015   MCH 28.8 06/19/2014 0954   MCHC 32.4 03/02/2022 1015   MCHC 33.8 06/19/2014 0954  RDW 13.4 03/02/2022 1015   LYMPHSABS 3.3 (H) 03/02/2022 1015   EOSABS 0.2 03/02/2022 1015   BASOSABS 0.1 03/02/2022 1015    Lab Results  Component Value Date   HGBA1C 5.8 (H) 03/02/2022    Assessment & Plan:   1. Sciatica of left side Stable - methocarbamol (ROBAXIN) 500 MG tablet; Take 1 tablet (500 mg total) by mouth every 8 (eight) hours as needed for muscle spasms.  Dispense: 60 tablet; Refill: 3  2. Generalized abdominal pain Will need to exclude H. pylori gastritis Initiate PPI - omeprazole (PRILOSEC) 40 MG capsule; Take 1 capsule (40 mg total) by mouth daily.  Dispense: 30 capsule; Refill: 1 - H. pylori breath test  3. Plantar fasciitis Uncontrolled on conservative treatment She might benefit from plantar fascial injections Unfortunately she could not afford podiatry care She will be applying for Medicaid and once approved we will refer her back again to podiatry  4. Vaginal discharge - Cervicovaginal ancillary only  5. Encounter for lipid screening for cardiovascular disease - LP+Non-HDL Cholesterol    Meds ordered this encounter  Medications   omeprazole (PRILOSEC) 40 MG capsule    Sig: Take 1 capsule (40 mg total) by mouth daily.    Dispense:  30 capsule    Refill:  1   methocarbamol (ROBAXIN) 500 MG tablet    Sig: Take 1 tablet (500 mg total) by mouth every 8 (eight) hours as needed for muscle spasms.    Dispense:  60 tablet    Refill:  3    Follow-up: No follow-ups on file.       Hoy Register, MD, FAAFP. [facility] [place], [place] [phone]   06/01/2022, 1:36 PM

## 2022-06-02 LAB — LP+NON-HDL CHOLESTEROL
Cholesterol, Total: 198 mg/dL (ref 100–199)
HDL: 42 mg/dL (ref >39)
LDL Chol Calc (NIH): 144 mg/dL — ABNORMAL HIGH (ref 0–99)
Total Non-HDL-Chol (LDL+VLDL): 156 mg/dL — ABNORMAL HIGH (ref 0–129)
Triglycerides: 68 mg/dL (ref 0–149)
VLDL Cholesterol Cal: 12 mg/dL (ref 5–40)

## 2022-06-02 LAB — H. PYLORI BREATH TEST: H pylori Breath Test: POSITIVE — AB

## 2022-06-06 ENCOUNTER — Other Ambulatory Visit: Payer: Self-pay

## 2022-06-06 ENCOUNTER — Other Ambulatory Visit: Payer: Self-pay | Admitting: Family Medicine

## 2022-06-06 ENCOUNTER — Telehealth: Payer: Self-pay | Admitting: Emergency Medicine

## 2022-06-06 DIAGNOSIS — B9681 Helicobacter pylori [H. pylori] as the cause of diseases classified elsewhere: Secondary | ICD-10-CM

## 2022-06-06 LAB — CERVICOVAGINAL ANCILLARY ONLY
Bacterial Vaginitis (gardnerella): POSITIVE — AB
Candida Glabrata: NEGATIVE
Candida Vaginitis: NEGATIVE
Chlamydia: NEGATIVE
Comment: NEGATIVE
Comment: NEGATIVE
Comment: NEGATIVE
Comment: NEGATIVE
Comment: NEGATIVE
Comment: NORMAL
Neisseria Gonorrhea: NEGATIVE
Trichomonas: NEGATIVE

## 2022-06-06 MED ORDER — CLARITHROMYCIN 500 MG PO TABS
500.0000 mg | ORAL_TABLET | Freq: Two times a day (BID) | ORAL | 0 refills | Status: DC
  Filled 2022-06-06: qty 28, 14d supply, fill #0

## 2022-06-06 MED ORDER — AMOXICILLIN 500 MG PO CAPS
1000.0000 mg | ORAL_CAPSULE | Freq: Two times a day (BID) | ORAL | 0 refills | Status: DC
  Filled 2022-06-06: qty 56, 14d supply, fill #0

## 2022-06-06 NOTE — Telephone Encounter (Signed)
Copied from CRM [id number]. Topic: General - Inquiry >> Jun 06, 2022 11:28 AM Runell Gess P wrote: Pt called back regarding her lab test for the vaginal swab  CB@  [phone]

## 2022-06-06 NOTE — Telephone Encounter (Signed)
Call placed to Cytology and they informed me of the results, I asked her to place the results in epic and she said that she will have to call IT to get the results to change over to epic.  The results were all negative except for positive BV, I will all patient once medication is sent to pharmacy.

## 2022-06-07 ENCOUNTER — Other Ambulatory Visit: Payer: Self-pay

## 2022-06-07 MED ORDER — METRONIDAZOLE 500 MG PO TABS
500.0000 mg | ORAL_TABLET | Freq: Two times a day (BID) | ORAL | 0 refills | Status: AC
  Filled 2022-06-07 – 2022-06-20 (×2): qty 14, 7d supply, fill #0

## 2022-06-07 NOTE — Addendum Note (Signed)
Addended by: Hoy Register on: 06/07/2022 08:25 AM   Modules accepted: Orders

## 2022-06-07 NOTE — Telephone Encounter (Signed)
Rx sent to Pharmacy

## 2022-06-07 NOTE — Telephone Encounter (Signed)
Patient wants to know if it is ok to take all the prescribed antibiotics at one time.

## 2022-06-07 NOTE — Telephone Encounter (Signed)
The antibiotics for the H. pylori need to be taken together and if she would like to hold off on the metronidazole for 2 weeks until she is done she can but there is no harm in doing them together.  The side effects she might notice might be diarrhea or stomach upset.

## 2022-06-08 NOTE — Telephone Encounter (Signed)
Pt has been sent a mychart message. 

## 2022-06-14 ENCOUNTER — Other Ambulatory Visit: Payer: Self-pay

## 2022-06-20 ENCOUNTER — Other Ambulatory Visit: Payer: Self-pay

## 2022-06-26 ENCOUNTER — Other Ambulatory Visit: Payer: Self-pay

## 2022-07-08 ENCOUNTER — Encounter: Payer: Self-pay | Admitting: Family Medicine

## 2022-07-13 ENCOUNTER — Other Ambulatory Visit: Payer: Self-pay

## 2022-07-13 ENCOUNTER — Ambulatory Visit (INDEPENDENT_AMBULATORY_CARE_PROVIDER_SITE_OTHER): Payer: Medicaid Other

## 2022-07-13 ENCOUNTER — Encounter: Payer: Self-pay | Admitting: Podiatry

## 2022-07-13 ENCOUNTER — Ambulatory Visit (INDEPENDENT_AMBULATORY_CARE_PROVIDER_SITE_OTHER): Payer: Medicaid Other | Admitting: Podiatry

## 2022-07-13 DIAGNOSIS — M722 Plantar fascial fibromatosis: Secondary | ICD-10-CM

## 2022-07-13 MED ORDER — METHYLPREDNISOLONE 4 MG PO TBPK
ORAL_TABLET | ORAL | 0 refills | Status: DC
  Filled 2022-07-13: qty 21, 6d supply, fill #0

## 2022-07-13 MED ORDER — MELOXICAM 15 MG PO TABS
15.0000 mg | ORAL_TABLET | Freq: Every day | ORAL | 3 refills | Status: DC
  Filled 2022-07-13: qty 30, 30d supply, fill #0

## 2022-07-13 MED ORDER — TRIAMCINOLONE ACETONIDE 40 MG/ML IJ SUSP
20.0000 mg | Freq: Once | INTRAMUSCULAR | Status: AC
  Administered 2022-07-13: 40 mg

## 2022-07-13 NOTE — Patient Instructions (Signed)

## 2022-07-13 NOTE — Progress Notes (Signed)
Subjective:  Patient ID: Crystal Koch, female    DOB: [birth year],  MRN: [id number] HPI Chief Complaint  Patient presents with   Foot Pain    Plantar heel left - aching x 3 years, intensity has increased over the years, AM pain always, but now its constant, tried naproxen, ice, stretching, soaking-no help   New Patient (Initial Visit)    42 y.o. female presents with the above complaint.   ROS: Denies fever chills nausea vomit muscle aches pains calf pain back pain chest pain shortness of breath.  Past Medical History:  Diagnosis Date   Asthma 1986   Sciatica 2011   since son was born. exacerbated every winter.    Past Surgical History:  Procedure Laterality Date   CESAREAN SECTION  2011    Current Outpatient Medications:    meloxicam (MOBIC) 15 MG tablet, Take 1 tablet (15 mg total) by mouth daily., Disp: 30 tablet, Rfl: 3   methylPREDNISolone (MEDROL DOSEPAK) 4 MG TBPK tablet, Take as directed per package instructions., Disp: 21 tablet, Rfl: 0   DULoxetine (CYMBALTA) 60 MG capsule, Take 1 capsule (60 mg total) by mouth daily. For sciatica pain, Disp: 30 capsule, Rfl: 3   methocarbamol (ROBAXIN) 500 MG tablet, Take 1 tablet (500 mg total) by mouth every 8 (eight) hours as needed for muscle spasms., Disp: 60 tablet, Rfl: 3   naproxen (NAPROSYN) 375 MG tablet, Take 1 tablet (375 mg total) by mouth 2 (two) times daily., Disp: 180 tablet, Rfl: 0   omeprazole (PRILOSEC) 40 MG capsule, Take 1 capsule (40 mg total) by mouth daily., Disp: 30 capsule, Rfl: 1  No Known Allergies Review of Systems Objective:  There were no vitals filed for this visit.  General: Well developed, nourished, in no acute distress, alert and oriented x3   Dermatological: Skin is warm, dry and supple bilateral. Nails x 10 are well maintained; remaining integument appears unremarkable at this time. There are no open sores, no preulcerative lesions, no rash or signs of infection present.  Vascular: Dorsalis  Pedis artery and Posterior Tibial artery pedal pulses are 2/4 bilateral with immedate capillary fill time. Pedal hair growth present. No varicosities and no lower extremity edema present bilateral.   Neruologic: Grossly intact via light touch bilateral. Vibratory intact via tuning fork bilateral. Protective threshold with Semmes Wienstein monofilament intact to all pedal sites bilateral. Patellar and Achilles deep tendon reflexes 2+ bilateral. No Babinski or clonus noted bilateral.   Musculoskeletal: No gross boney pedal deformities bilateral. No pain, crepitus, or limitation noted with foot and ankle range of motion bilateral. Muscular strength 5/5 in all groups tested bilateral.  Mild pes planovalgus.  Appears to be flexible in nature.  She still has pain on palpation medial calcaneal tubercle of the left heel.  Gait: Unassisted, Nonantalgic.    Radiographs:  Bilateral radiographs taken today demonstrate osseously mature individual with what appears to be a thickening of the plantar fascia at the calcaneal insertion site left appears to be worse than the right.  But there is no acute area of inflammation there.  Assessment & Plan:   Assessment: Planter fasciitis chronic in nature left worse than right.    Plan: Discussed etiology pathology conservative versus surgical therapies discussed appropriate shoe gear stretching exercise ice therapy sugar modifications injected the bilateral heels today 20 mg Kenalog 5 mg Marcaine to the point of maximal tenderness.  Started her on methylprednisolone to be followed by meloxicam.  Will follow-up with her in 1 month  Garrel Ridgel, DPM

## 2022-07-14 ENCOUNTER — Ambulatory Visit (INDEPENDENT_AMBULATORY_CARE_PROVIDER_SITE_OTHER): Payer: No Typology Code available for payment source

## 2022-07-14 ENCOUNTER — Ambulatory Visit (INDEPENDENT_AMBULATORY_CARE_PROVIDER_SITE_OTHER): Payer: Medicaid Other | Admitting: Orthopedic Surgery

## 2022-07-14 ENCOUNTER — Encounter: Payer: Self-pay | Admitting: Orthopedic Surgery

## 2022-07-14 VITALS — BP 110/76 | HR 99 | Ht 62.0 in | Wt 169.0 lb

## 2022-07-14 DIAGNOSIS — M545 Low back pain, unspecified: Secondary | ICD-10-CM

## 2022-07-14 DIAGNOSIS — M5416 Radiculopathy, lumbar region: Secondary | ICD-10-CM | POA: Diagnosis not present

## 2022-07-14 NOTE — Progress Notes (Addendum)
Orthopedic Spine Surgery Office Note  Assessment: Patient is a 42 y.o. female with low back pain that radiates into the left buttock and posterior thigh.  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 Discussed that the conservative treatments include:  -activity modification  -physical therapy  -over the counter pain medications  -medrol dosepak  -lumbar steroid injections -Patient has tried activity modification, NSAIDs, Tylenol -Recommended L5/S1 left-sided transforaminal injection.  Referral provided to Dr. Ernestina Patches today -If surgery ever is considered as a treatment option, would need to quit nicotine-containing products -Patient should return to office in 6 weeks, x-rays at next visit: None   Patient expressed understanding of the plan and all questions were answered to the patient's satisfaction.   ___________________________________________________________________________   History:  Patient is a 42 y.o. female who presents today for lumbar spine.  Patient has had low back pain that radiates into the left buttock and posterior thigh since 2013.  There is no trauma or injury that brought on the pain.  Pain has been stable in severity.  She does note that it usually is worse in the winter when it is cold outside.  She feels it on a daily basis.  She gets numbness and tingling in the same distribution as the pain.  She also feels a burning sensation and cramping in her left posterior thigh.  No symptoms on the right side.  She also reported a left-sided groin numbness.  It is not complete numbness but she does notice that there is decreased sensation on that side of the groin.   Weakness: denies Symptoms of imbalance: denies Paresthesias and numbness: yes, down the posterior aspect of the left leg.  Decree sensation in the left  groin.  No other numbness or paresthesias Bowel or bladder incontinence: denies Saddle anesthesia: denies  Treatments tried: activity modification, tylenol, NSAIDs  Review of systems: Denies fevers and chills, night sweats, unexplained weight loss, history of cancer. Has had pain that wakes her at night.   Past medical history: Plantar fasciitis Chronic pain  Allergies: NKDA  Past surgical history:  C section  Social history: Reports use of nicotine product (smoking, vaping, patches, smokeless) Alcohol use: denies Denies recreational drug use   Physical Exam:  General: no acute distress, appears stated age Neurologic: alert, answering questions appropriately, following commands Respiratory: unlabored breathing on room air, symmetric chest rise Psychiatric: appropriate affect, normal cadence to speech   MSK (spine):  -Strength exam      Left  Right EHL    5/5  5/5 TA    5/5  5/5 GSC    5/5  5/5 Knee extension  5/5  5/5 Hip flexion   5/5  5/5  -Sensory exam    Sensation intact to light touch in L3-S1 nerve distributions of bilateral lower extremities  -Achilles DTR: 2/4 on the left, 2/4 on the right -Patellar tendon DTR: 2/4 on the left, 2/4 on the right  -Straight leg raise: negative -Contralateral straight leg raise: negative -Femoral nerve stretch test: negative bilaterally -Clonus: no beats bilaterally  -Left hip exam: no pain through range of motion, negative faber, negative stinchfield -Right hip exam: no pain through range of motion, negative faber, negative stinchfield  Imaging: XR of the lumbar spine from 07/14/2022 was independently reviewed and interpreted, showing no significant degenerative changes.  No fracture or dislocation.  No evidence of  instability on flexion-extension views  MRI of the lumbar spine from 03/11/2022 was independently reviewed and interpreted, showing left paracentral disc herniation at L5/S1. No significant central, lateral  recess, or foraminal stenosis.    Patient name: Crystal Koch Patient MRN: [id number] Date of visit: 07/14/22

## 2022-07-25 ENCOUNTER — Ambulatory Visit: Payer: Self-pay

## 2022-07-25 ENCOUNTER — Encounter: Payer: Self-pay | Admitting: Nurse Practitioner

## 2022-07-25 ENCOUNTER — Ambulatory Visit (INDEPENDENT_AMBULATORY_CARE_PROVIDER_SITE_OTHER): Payer: Medicaid Other | Admitting: Physical Medicine and Rehabilitation

## 2022-07-25 ENCOUNTER — Other Ambulatory Visit: Payer: Self-pay

## 2022-07-25 ENCOUNTER — Ambulatory Visit: Payer: Medicaid Other | Attending: Nurse Practitioner | Admitting: Nurse Practitioner

## 2022-07-25 VITALS — BP 127/75 | HR 70 | Ht 62.0 in | Wt 161.8 lb

## 2022-07-25 VITALS — BP 139/87 | HR 120

## 2022-07-25 DIAGNOSIS — K2970 Gastritis, unspecified, without bleeding: Secondary | ICD-10-CM

## 2022-07-25 DIAGNOSIS — M5416 Radiculopathy, lumbar region: Secondary | ICD-10-CM

## 2022-07-25 DIAGNOSIS — K219 Gastro-esophageal reflux disease without esophagitis: Secondary | ICD-10-CM

## 2022-07-25 DIAGNOSIS — B9681 Helicobacter pylori [H. pylori] as the cause of diseases classified elsewhere: Secondary | ICD-10-CM

## 2022-07-25 DIAGNOSIS — Z1231 Encounter for screening mammogram for malignant neoplasm of breast: Secondary | ICD-10-CM | POA: Diagnosis not present

## 2022-07-25 MED ORDER — METHYLPREDNISOLONE ACETATE 80 MG/ML IJ SUSP
80.0000 mg | Freq: Once | INTRAMUSCULAR | Status: AC
  Administered 2022-07-25: 80 mg

## 2022-07-25 MED ORDER — OMEPRAZOLE 40 MG PO CPDR
40.0000 mg | DELAYED_RELEASE_CAPSULE | Freq: Every day | ORAL | 1 refills | Status: DC
  Filled 2022-07-25: qty 90, 90d supply, fill #0
  Filled 2023-06-07 (×2): qty 90, 90d supply, fill #1

## 2022-07-25 NOTE — Progress Notes (Signed)
Functional Pain Scale - descriptive words and definitions  Unmanageable (7)  Pain interferes with normal ADL's/nothing seems to help/sleep is very difficult/active distractions are very difficult to concentrate on. Severe range order  Average Pain 8-10   +Driver, -BT, -Dye Allergies.  Lower back pain on left side, but right has started to bother her. Pain radiates into the left leg from buttock to knee

## 2022-07-25 NOTE — Progress Notes (Signed)
Assessment & Plan:  Crystal Koch was seen today for lab work.  Diagnoses and all orders for this visit:  Helicobacter pylori gastritis Hpylori repeat test pending   Gastroesophageal reflux disease without esophagitis -     omeprazole (PRILOSEC) 40 MG capsule; Take 1 capsule (40 mg total) by mouth daily. INSTRUCTIONS: Avoid GERD Triggers: acidic, spicy or fried foods, caffeine, coffee, sodas,  alcohol and chocolate.    Breast cancer screening by mammogram -     MM 3D SCREEN BREAST BILATERAL; Future    Patient has been counseled on age-appropriate routine health concerns for screening and prevention. These are reviewed and up-to-date. Referrals have been placed accordingly. Immunizations are up-to-date or declined.    Subjective:   Chief Complaint  Patient presents with   Lab Work   HPI [employer] 42 y.o. female presents to office today follow-up to H. pylori.   She was diagnosed with H. pylori on 06/06/2022 and prescribed triple therapy which she has now completed as of 3 to 4 weeks ago.  Her PCP Dr. Alfonse Spruce has instructed her to have a repeat H. pylori test completed and she is here for that today.  Notes almost complete resolution of abdominal pain at this time.    Review of Systems  Constitutional:  Negative for fever, malaise/fatigue and weight loss.  HENT: Negative.  Negative for nosebleeds.   Eyes: Negative.  Negative for blurred vision, double vision and photophobia.  Respiratory: Negative.  Negative for cough and shortness of breath.   Cardiovascular: Negative.  Negative for chest pain, palpitations and leg swelling.  Gastrointestinal:  Positive for heartburn. Negative for abdominal pain, blood in stool, constipation, diarrhea, melena, nausea and vomiting.  Musculoskeletal: Negative.  Negative for myalgias.  Neurological: Negative.  Negative for dizziness, focal weakness, seizures and headaches.  Psychiatric/Behavioral: Negative.  Negative for suicidal ideas.     Past  Medical History:  Diagnosis Date   Asthma 1986   Sciatica 2011   since son was born. exacerbated every winter.     Past Surgical History:  Procedure Laterality Date   CESAREAN SECTION  2011    Family History  Problem Relation Age of Onset   Cancer Maternal Grandmother    Cancer Father    Thyroid disease Mother    Hypertension Maternal Uncle    Diabetes Neg Hx     Social History Reviewed with no changes to be made today.   Outpatient Medications Prior to Visit  Medication Sig Dispense Refill   meloxicam (MOBIC) 15 MG tablet Take 1 tablet (15 mg total) by mouth daily. 30 tablet 3   methocarbamol (ROBAXIN) 500 MG tablet Take 1 tablet (500 mg total) by mouth every 8 (eight) hours as needed for muscle spasms. 60 tablet 3   naproxen (NAPROSYN) 375 MG tablet Take 1 tablet (375 mg total) by mouth 2 (two) times daily. 180 tablet 0   DULoxetine (CYMBALTA) 60 MG capsule Take 1 capsule (60 mg total) by mouth daily. For sciatica pain (Patient not taking: Reported on 07/25/2022) 30 capsule 3   methylPREDNISolone (MEDROL DOSEPAK) 4 MG TBPK tablet Take as directed per package instructions. 21 tablet 0   omeprazole (PRILOSEC) 40 MG capsule Take 1 capsule (40 mg total) by mouth daily. (Patient not taking: Reported on 07/25/2022) 30 capsule 1   No facility-administered medications prior to visit.    No Known Allergies     Objective:    BP 127/75   Pulse 70   Ht 5' 2"$  (  1.575 m)   Wt 161 lb 12.8 oz (73.4 kg)   LMP 07/25/2022   SpO2 99%   BMI 29.59 kg/m  Wt Readings from Last 3 Encounters:  07/25/22 161 lb 12.8 oz (73.4 kg)  07/14/22 169 lb (76.7 kg)  06/01/22 168 lb 12.8 oz (76.6 kg)    Physical Exam Vitals and nursing note reviewed.  Constitutional:      Appearance: She is well-developed.  HENT:     Head: Normocephalic and atraumatic.  Cardiovascular:     Rate and Rhythm: Normal rate and regular rhythm.     Heart sounds: Normal heart sounds. No murmur heard.    No friction  rub. No gallop.  Pulmonary:     Effort: Pulmonary effort is normal. No tachypnea or respiratory distress.     Breath sounds: Normal breath sounds. No decreased breath sounds, wheezing, rhonchi or rales.  Chest:     Chest wall: No tenderness.  Abdominal:     General: Bowel sounds are normal.     Palpations: Abdomen is soft.  Musculoskeletal:        General: Normal range of motion.     Cervical back: Normal range of motion.  Skin:    General: Skin is warm and dry.  Neurological:     Mental Status: She is alert and oriented to person, place, and time.     Coordination: Coordination normal.  Psychiatric:        Behavior: Behavior normal. Behavior is cooperative.        Thought Content: Thought content normal.        Judgment: Judgment normal.          Patient has been counseled extensively about nutrition and exercise as well as the importance of adherence with medications and regular follow-up. The patient was given clear instructions to go to ER or return to medical center if symptoms don't improve, worsen or new problems develop. The patient verbalized understanding.   Follow-up: Return if symptoms worsen or fail to improve.   Gildardo Pounds, FNP-BC [facility] and [facility] [place], [place]   07/25/2022, 8:58 AM

## 2022-07-25 NOTE — Patient Instructions (Signed)

## 2022-07-25 NOTE — Patient Instructions (Signed)
[facility] in: [facility] Address: [address], [place], [place] [address] Open ? Closes 5:30?PM Phone: [phone]

## 2022-07-27 LAB — H. PYLORI BREATH TEST: H pylori Breath Test: NEGATIVE

## 2022-07-28 ENCOUNTER — Other Ambulatory Visit: Payer: Self-pay

## 2022-08-01 ENCOUNTER — Other Ambulatory Visit: Payer: Self-pay

## 2022-08-07 NOTE — Procedures (Signed)
S1 Lumbosacral Transforaminal Epidural Steroid Injection - Sub-Pedicular Approach with Fluoroscopic Guidance   Patient: Crystal Koch      Date of Birth: 1980-12-11 MRN: [id number] PCP: Charlott Rakes, MD      Visit Date: 07/25/2022   Universal Protocol:    Date/Time: 02/26/248:28 PM  Consent Given By: the patient  Position:  PRONE  Additional Comments: Vital signs were monitored before and after the procedure. Patient was prepped and draped in the usual sterile fashion. The correct patient, procedure, and site was verified.   Injection Procedure Details:  Procedure Site One Meds Administered:  Meds ordered this encounter  Medications   methylPREDNISolone acetate (DEPO-MEDROL) injection 80 mg    Laterality: Left  Location/Site:  S1 Foramen   Needle size: 22 ga.  Needle type: Spinal  Needle Placement: Transforaminal  Findings:   -Comments: Excellent flow of contrast along the nerve, nerve root and into the epidural space.  Epidurogram: Contrast epidurogram showed no nerve root cut off or restricted flow pattern.  Procedure Details: After squaring off the sacral end-plate to get a true AP view, the C-arm was positioned so that the best possible view of the S1 foramen was visualized. The soft tissues overlying this structure were infiltrated with 2-3 ml. of 1% Lidocaine without Epinephrine.    The spinal needle was inserted toward the target using a "trajectory" view along the fluoroscope beam.  Under AP and lateral visualization, the needle was advanced so it did not puncture dura. Biplanar projections were used to confirm position. Aspiration was confirmed to be negative for CSF and/or blood. A 1-2 ml. volume of Isovue-250 was injected and flow of contrast was noted at each level. Radiographs were obtained for documentation purposes.   After attaining the desired flow of contrast documented above, a 0.5 to 1.0 ml test dose of 0.25% Marcaine was injected into each  respective transforaminal space.  The patient was observed for 90 seconds post injection.  After no sensory deficits were reported, and normal lower extremity motor function was noted,   the above injectate was administered so that equal amounts of the injectate were placed at each foramen (level) into the transforaminal epidural space.   Additional Comments:  No complications occurred Dressing: Band-Aid with 2 x 2 sterile gauze    Post-procedure details: Patient was observed during the procedure. Post-procedure instructions were reviewed.  Patient left the clinic in stable condition.

## 2022-08-07 NOTE — Progress Notes (Signed)
Crystal Koch - 42 y.o. female MRN [id number]  Date of birth: [birth year]  Office Visit Note: Visit Date: 07/25/2022 PCP: Charlott Rakes, MD Referred by: Callie Fielding, MD  Subjective: Chief Complaint  Patient presents with   Lower Back - Pain   HPI:  Crystal Koch is a 42 y.o. female who comes in today at the request of Dr. Ileene Rubens for planned Left S1-2 Lumbar Transforaminal epidural steroid injection with fluoroscopic guidance.  The patient has failed conservative care including home exercise, medications, time and activity modification.  This injection will be diagnostic and hopefully therapeutic.  Please see requesting physician notes for further details and justification.   ROS Otherwise per HPI.  Assessment & Plan: Visit Diagnoses:    ICD-10-CM   1. Lumbar radiculopathy  M54.16 XR C-ARM NO REPORT    Epidural Steroid injection    methylPREDNISolone acetate (DEPO-MEDROL) injection 80 mg      Plan: No additional findings.   Meds & Orders:  Meds ordered this encounter  Medications   methylPREDNISolone acetate (DEPO-MEDROL) injection 80 mg    Orders Placed This Encounter  Procedures   XR C-ARM NO REPORT   Epidural Steroid injection    Follow-up: Return for visit to requesting provider as needed.   Procedures: No procedures performed  S1 Lumbosacral Transforaminal Epidural Steroid Injection - Sub-Pedicular Approach with Fluoroscopic Guidance   Patient: Crystal Koch      Date of Birth: 04-08-1981 MRN: [id number] PCP: Charlott Rakes, MD      Visit Date: 07/25/2022   Universal Protocol:    Date/Time: 02/26/248:28 PM  Consent Given By: the patient  Position:  PRONE  Additional Comments: Vital signs were monitored before and after the procedure. Patient was prepped and draped in the usual sterile fashion. The correct patient, procedure, and site was verified.   Injection Procedure Details:  Procedure Site One Meds Administered:  Meds ordered this  encounter  Medications   methylPREDNISolone acetate (DEPO-MEDROL) injection 80 mg    Laterality: Left  Location/Site:  S1 Foramen   Needle size: 22 ga.  Needle type: Spinal  Needle Placement: Transforaminal  Findings:   -Comments: Excellent flow of contrast along the nerve, nerve root and into the epidural space.  Epidurogram: Contrast epidurogram showed no nerve root cut off or restricted flow pattern.  Procedure Details: After squaring off the sacral end-plate to get a true AP view, the C-arm was positioned so that the best possible view of the S1 foramen was visualized. The soft tissues overlying this structure were infiltrated with 2-3 ml. of 1% Lidocaine without Epinephrine.    The spinal needle was inserted toward the target using a "trajectory" view along the fluoroscope beam.  Under AP and lateral visualization, the needle was advanced so it did not puncture dura. Biplanar projections were used to confirm position. Aspiration was confirmed to be negative for CSF and/or blood. A 1-2 ml. volume of Isovue-250 was injected and flow of contrast was noted at each level. Radiographs were obtained for documentation purposes.   After attaining the desired flow of contrast documented above, a 0.5 to 1.0 ml test dose of 0.25% Marcaine was injected into each respective transforaminal space.  The patient was observed for 90 seconds post injection.  After no sensory deficits were reported, and normal lower extremity motor function was noted,   the above injectate was administered so that equal amounts of the injectate were placed at each foramen (level) into the transforaminal epidural space.  Additional Comments:  No complications occurred Dressing: Band-Aid with 2 x 2 sterile gauze    Post-procedure details: Patient was observed during the procedure. Post-procedure instructions were reviewed.  Patient left the clinic in stable condition.   Clinical History: MRI LUMBAR SPINE  WITHOUT CONTRAST   TECHNIQUE: Multiplanar, multisequence MR imaging of the lumbar spine was performed. No intravenous contrast was administered.   COMPARISON:  None Available.   FINDINGS: Segmentation:  5 lumbar type vertebrae   Alignment:  Physiologic.   Vertebrae:  No fracture, evidence of discitis, or bone lesion.   Conus medullaris and cauda equina: Conus extends to the L2 level. Conus and cauda equina appear normal.   Paraspinal and other soft tissues: Negative for perispinal mass or inflammation. 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   Disc levels:   Diffusely preserved disc height and hydration. Left paracentral herniation at L5-S1 which impinges on the left S1 nerve root at the subarticular recess. Negative facets and foramina.   IMPRESSION: L5-S1 left paracentral protrusion impinging on the left S1 nerve root.     Electronically Signed   By: Jorje Guild M.D.   On: 03/15/2022 05:31     Objective:  VS:  HT:    WT:   BMI:     BP:139/87  HR:(!) 120bpm  TEMP: ( )  RESP:  Physical Exam Vitals and nursing note reviewed.  Constitutional:      General: She is not in acute distress.    Appearance: Normal appearance. She is not ill-appearing.  HENT:     Head: Normocephalic and atraumatic.     Right Ear: External ear normal.     Left Ear: External ear normal.  Eyes:     Extraocular Movements: Extraocular movements intact.  Cardiovascular:     Rate and Rhythm: Normal rate.     Pulses: Normal pulses.  Pulmonary:     Effort: Pulmonary effort is normal. No respiratory distress.  Abdominal:     General: There is no distension.     Palpations: Abdomen is soft.  Musculoskeletal:        General: Tenderness present.     Cervical back: Neck supple.     Right lower leg: No edema.     Left lower leg: No edema.     Comments: Patient has good distal strength with no pain over the greater trochanters.  No clonus or focal  weakness.  Skin:    Findings: No erythema, lesion or rash.  Neurological:     General: No focal deficit present.     Mental Status: She is alert and oriented to person, place, and time.     Sensory: No sensory deficit.     Motor: No weakness or abnormal muscle tone.     Coordination: Coordination normal.  Psychiatric:        Mood and Affect: Mood normal.        Behavior: Behavior normal.      Imaging: No results found.

## 2022-08-24 ENCOUNTER — Other Ambulatory Visit: Payer: Self-pay

## 2022-08-24 ENCOUNTER — Ambulatory Visit (INDEPENDENT_AMBULATORY_CARE_PROVIDER_SITE_OTHER): Payer: Medicaid Other | Admitting: Podiatry

## 2022-08-24 DIAGNOSIS — M722 Plantar fascial fibromatosis: Secondary | ICD-10-CM | POA: Diagnosis not present

## 2022-08-24 MED ORDER — MELOXICAM 15 MG PO TABS
15.0000 mg | ORAL_TABLET | Freq: Every day | ORAL | 3 refills | Status: DC
  Filled 2022-08-24 – 2022-08-31 (×2): qty 30, 30d supply, fill #0
  Filled 2022-09-15 – 2022-11-20 (×4): qty 30, 30d supply, fill #1

## 2022-08-24 MED ORDER — TRIAMCINOLONE ACETONIDE 40 MG/ML IJ SUSP
20.0000 mg | Freq: Once | INTRAMUSCULAR | Status: AC
  Administered 2022-08-24: 20 mg

## 2022-08-24 NOTE — Progress Notes (Signed)
She presents today for follow-up of her Planter fasciitis left foot.  States that its approximately 50% improved continues to wear plantar fascia brace her tennis shoes and continues to take her meloxicam on a daily basis.  Denies any new trauma.  Objective: Vital signs stable alert oriented x 3 pulses remain palpable.  She still has some tenderness on palpation of the medial calcaneal tubercle of the left heel that is much more supple and not warm on palpation as it was last time.  Assessment: Plan fasciitis resolving by approximately 50%.  Plan: Reinjected today continue all conservative therapies including meloxicam.  I like to follow-up with [place] for clinical physical clinical directed mobic

## 2022-08-25 ENCOUNTER — Ambulatory Visit (INDEPENDENT_AMBULATORY_CARE_PROVIDER_SITE_OTHER): Payer: No Typology Code available for payment source | Admitting: Orthopedic Surgery

## 2022-08-25 DIAGNOSIS — M21371 Foot drop, right foot: Secondary | ICD-10-CM | POA: Diagnosis not present

## 2022-08-25 NOTE — Progress Notes (Signed)
Orthopedic Spine Surgery Office Note  Assessment: Patient is a 42 y.o. female with low back pain that radiates into her left buttock and posterior thigh.  Improved with injection   Plan: -Explained that initially conservative treatment is tried as a significant number of patients may experience relief with these treatment modalities. Discussed that the conservative treatments include:  -activity modification  -physical therapy  -over the counter pain medications  -medrol dosepak  -lumbar steroid injections -Patient has tried activity modification, tylenol, NSAIDs, lumbar steroid injection -Recommended continued observation since her leg pain is better. I explained that I do not think her right leg symptoms are a radiculopathy since it is only there at the end of the workday -Patient should return to office in 6 weeks, x-rays at next visit: None   Patient expressed understanding of the plan and all questions were answered to the patient's satisfaction.   ___________________________________________________________________________  History: Patient is a 42 y.o. female who has been previously seen in the office for symptoms consistent with lumbar radiculopathy.  After our last visit, I recommended an injection to the left L5/S1 transforaminal space.  She states that she got significant relief with that injection.  Her pain is now much more tolerable.  She did develop issues on her right side after that injection.  She states that she has pain in her right buttock at the end of the day after working all day.  She does not have it when she is sleeping or at the beginning of the day.  She has not pain radiating past the knee on that side.  No numbness or paresthesias on either side.  No bowel or bladder incontinence.  No saddle anesthesia.  Previous treatments: activity modification, tylenol, NSAIDs, lumbar steroid injection  Physical Exam:  General: no acute distress, appears stated  age Neurologic: alert, answering questions appropriately, following commands Respiratory: unlabored breathing on room air, symmetric chest rise Psychiatric: appropriate affect, normal cadence to speech   MSK (spine):  -Strength exam      Left  Right EHL    5/5  5/5 TA    5/5  5/5 GSC    5/5  5/5 Knee extension  5/5  5/5 Hip flexion   5/5  5/5  -Sensory exam    Sensation intact to light touch in L3-S1 nerve distributions of bilateral lower extremities  -Achilles DTR: 2/4 on the left, 2/4 on the right -Patellar tendon DTR: 2/4 on the left, 2/4 on the right  -Straight leg raise: Negative bilaterally -Femoral nerve stretch test: Negative bilaterally -Clonus: no beats bilaterally  Imaging: XR of the lumbar spine from 07/14/2022 was previously independently reviewed and interpreted, showing no significant degenerative changes.  No fracture or dislocation.  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 No significant central, lateral recess, or foraminal stenosis.    Patient name: Crystal Koch Patient MRN: [id number] Date of visit: 08/25/22

## 2022-08-30 ENCOUNTER — Other Ambulatory Visit: Payer: Self-pay

## 2022-08-31 ENCOUNTER — Other Ambulatory Visit: Payer: Self-pay

## 2022-09-04 ENCOUNTER — Telehealth: Payer: No Typology Code available for payment source | Admitting: Physician Assistant

## 2022-09-04 DIAGNOSIS — B9689 Other specified bacterial agents as the cause of diseases classified elsewhere: Secondary | ICD-10-CM | POA: Diagnosis not present

## 2022-09-04 DIAGNOSIS — J028 Acute pharyngitis due to other specified organisms: Secondary | ICD-10-CM | POA: Diagnosis not present

## 2022-09-04 MED ORDER — AMOXICILLIN 500 MG PO CAPS: 500.0000 mg | ORAL_CAPSULE | Freq: Two times a day (BID) | ORAL | 0 refills | Status: AC

## 2022-09-04 NOTE — Patient Instructions (Signed)
Letha Cape, thank you for joining Mar Daring, PA-C for today's virtual visit.  While this provider is not your primary care provider (PCP), if your PCP is located in our provider database this encounter information will be shared with them immediately following your visit.   [facility] account gives you access to today's visit and all your visits, tests, and labs performed at [facility] " click here if you don't have a [facility] account or go to [email or web address]  Consent: (Patient) Crystal Koch provided verbal consent for this virtual visit at the beginning of the encounter.  Current Medications:  Current Outpatient Medications:    amoxicillin (AMOXIL) 500 MG capsule, Take 1 capsule (500 mg total) by mouth 2 (two) times daily for 10 days., Disp: 20 capsule, Rfl: 0   meloxicam (MOBIC) 15 MG tablet, Take 1 tablet (15 mg total) by mouth daily., Disp: 30 tablet, Rfl: 3   methocarbamol (ROBAXIN) 500 MG tablet, Take 1 tablet (500 mg total) by mouth every 8 (eight) hours as needed for muscle spasms., Disp: 60 tablet, Rfl: 3   omeprazole (PRILOSEC) 40 MG capsule, Take 1 capsule (40 mg total) by mouth daily., Disp: 90 capsule, Rfl: 1   Medications ordered in this encounter:  Meds ordered this encounter  Medications   amoxicillin (AMOXIL) 500 MG capsule    Sig: Take 1 capsule (500 mg total) by mouth 2 (two) times daily for 10 days.    Dispense:  20 capsule    Refill:  0    Order Specific Question:   Supervising Provider    Answer:   Chase Picket [id number]     *If you need refills on other medications prior to your next appointment, please contact your pharmacy*  Follow-Up: Call back or seek an in-person evaluation if the symptoms worsen or if the condition fails to improve as anticipated.  [facility] [phone]  Other Instructions  Pharyngitis  Pharyngitis is inflammation of the throat (pharynx). It is a very  common cause of sore throat. Pharyngitis can be caused by a bacteria, but it is usually caused by a virus. Most cases of pharyngitis get better on their own without treatment. What are the causes? This condition may be caused by: Infection by viruses (viral). Viral pharyngitis spreads easily from person to person (is contagious) through coughing, sneezing, and sharing of personal items or utensils such as cups, forks, spoons, and toothbrushes. Infection by bacteria (bacterial). Bacterial pharyngitis may be spread by touching the nose or face after coming in contact with the bacteria, or through close contact, such as kissing. Allergies. Allergies can cause buildup of mucus in the throat (post-nasal drip), leading to inflammation and irritation. Allergies can also cause blocked nasal passages, forcing breathing through the mouth, which dries and irritates the throat. What increases the risk? You are more likely to develop this condition if: You are 25-85 years old. You are exposed to crowded environments such as daycare, school, or dormitory living. You live in a cold climate. You have a weakened disease-fighting (immune) system. What are the signs or symptoms? Symptoms of this condition vary by the cause. Common symptoms of this condition include: Sore throat. Fatigue. Low-grade fever. Stuffy nose (nasal congestion) and cough. Headache. Other symptoms may include: Glands in the neck (lymph nodes) that are swollen. Skin rashes. Plaque-like film on the throat or tonsils. This is often a symptom of bacterial pharyngitis. Vomiting. Red, itchy eyes (conjunctivitis). Loss of appetite.  Joint pain and muscle aches. Enlarged tonsils. How is this diagnosed? This condition may be diagnosed based on your medical history and a physical exam. Your health care provider will ask you questions about your illness and your symptoms. A swab of your throat may be done to check for bacteria (rapid strep  test). Other lab tests may also be done, depending on the suspected cause, but these are rare. How is this treated? Many times, treatment is not needed for this condition. Pharyngitis usually gets better in 3-4 days without treatment. Bacterial pharyngitis may be treated with antibiotic medicines. Follow these instructions at home: Medicines Take over-the-counter and prescription medicines only as told by your health care provider. If you were prescribed an antibiotic medicine, take it as told by your health care provider. Do not stop taking the antibiotic even if you start to feel better. Use throat sprays to soothe your throat as told by your health care provider. Children can get pharyngitis. Do not give your child aspirin because of the association with Reye's syndrome. Managing pain To help with pain, try: Sipping warm liquids, such as broth, herbal tea, or warm water. Eating or drinking cold or frozen liquids, such as frozen ice pops. Gargling with a mixture of salt and water 3-4 times a day or as needed. To make salt water, completely dissolve -1 tsp (3-6 g) of salt in 1 cup (237 mL) of warm water. Sucking on hard candy or throat lozenges. Putting a cool-mist humidifier in your bedroom at night to moisten the air. Sitting in the bathroom with the door closed for 5-10 minutes while you run hot water in the shower.  General instructions  Do not use any products that contain nicotine or tobacco. These products include cigarettes, chewing tobacco, and vaping devices, such as e-cigarettes. If you need help quitting, ask your health care provider. Rest as told by your health care provider. Drink enough fluid to keep your urine pale yellow. How is this prevented? To help prevent becoming infected or spreading infection: Wash your hands often with soap and water for at least 20 seconds. If soap and water are not available, use hand sanitizer. Do not touch your eyes, nose, or mouth with  unwashed hands, and wash hands after touching these areas. Do not share cups or eating utensils. Avoid close contact with people who are sick. Contact a health care provider if: You have large, tender lumps in your neck. You have a rash. You cough up green, yellow-brown, or bloody mucus. Get help right away if: Your neck becomes stiff. You drool or are unable to swallow liquids. You cannot drink or take medicines without vomiting. You have severe pain that does not go away, even after you take medicine. You have trouble breathing, and it is not caused by a stuffy nose. You have new pain and swelling in your joints such as the knees, ankles, wrists, or elbows. These symptoms may represent a serious problem that is an emergency. Do not wait to see if the symptoms will go away. Get medical help right away. Call your local emergency services (911 in the [place] Do not drive yourself to the hospital. Summary Pharyngitis is redness, pain, and swelling (inflammation) of the throat (pharynx). While pharyngitis can be caused by a bacteria, the most common causes are viral. Most cases of pharyngitis get better on their own without treatment. Bacterial pharyngitis is treated with antibiotic medicines. This information is not intended to replace advice given to you by  your health care provider. Make sure you discuss any questions you have with your health care provider. Document Revised: 08/25/2020 Document Reviewed: 08/25/2020 Elsevier Patient Education  [place].    If you have been instructed to have an in-person evaluation today at a local Urgent Care facility, please use the link below. It will take you to a list of all of our available [facility], including address, phone number and hours of operation. Please do not delay care.  [facility]  If you or a family member do not have a primary care provider, use the link below to schedule a visit and establish  care. When you choose a [facility] primary care physician or advanced practice provider, you gain a long-term partner in health. Find a Primary Care Provider  Learn more about [facility]'s in-office and virtual care options: [facility]

## 2022-09-04 NOTE — Progress Notes (Signed)
Virtual Visit Consent   [name], you are scheduled for a virtual visit with a [facility] provider today. Just as with appointments in the office, your consent must be obtained to participate. Your consent will be active for this visit and any virtual visit you may have with one of our providers in the next 365 days. If you have a MyChart account, a copy of this consent can be sent to you electronically.  As this is a virtual visit, video technology does not allow for your provider to perform a traditional examination. This may limit your provider's ability to fully assess your condition. If your provider identifies any concerns that need to be evaluated in person or the need to arrange testing (such as labs, EKG, etc.), we will make arrangements to do so. Although advances in technology are sophisticated, we cannot ensure that it will always work on either your end or our end. If the connection with a video visit is poor, the visit may have to be switched to a telephone visit. With either a video or telephone visit, we are not always able to ensure that we have a secure connection.  By engaging in this virtual visit, you consent to the provision of healthcare and authorize for your insurance to be billed (if applicable) for the services provided during this visit. Depending on your insurance coverage, you may receive a charge related to this service.  I need to obtain your verbal consent now. Are you willing to proceed with your visit today? [name] has provided verbal consent on 09/04/2022 for a virtual visit (video or telephone). Mar Daring, PA-C  Date: 09/04/2022 7:51 AM  Virtual Visit via Video Note   I, Mar Daring, connected with  Crystal Koch  ([id number], 42-05-1981) on 09/04/22 at  7:45 AM EDT by a video-enabled telemedicine application and verified that I am speaking with the correct person using two identifiers.  Location: Patient: Virtual Visit Location Patient:  Home Provider: Virtual Visit Location Provider: Home Office   I discussed the limitations of evaluation and management by telemedicine and the availability of in person appointments. The patient expressed understanding and agreed to proceed.    History of Present Illness: Crystal Koch is a 42 y.o. who identifies as a female who was assigned female at birth, and is being seen today for sore throat.  HPI: Sore Throat  This is a new problem. The current episode started in the past 7 days (started with a scatchy throat on Friday, Saturday started having increased body aches and worsening sore throat). The problem has been gradually worsening. Maximum temperature: subjective fevers. The pain is moderate. Associated symptoms include congestion (more left sided), ear pain (left ear), headaches, a hoarse voice, swollen glands and trouble swallowing. Pertinent negatives include no coughing, diarrhea, drooling, ear discharge, plugged ear sensation, shortness of breath or vomiting. Associated symptoms comments: Myalgias, nausea. Treatments tried: epsom salt soak, alka seltzer cold and flu. The treatment provided no relief.     Problems:  Patient Active Problem List   Diagnosis Date Noted   Acute non-recurrent sinusitis 05/31/2021   Vitamin D deficiency 06/22/2014   Sciatica of left side 06/19/2014   Current smoker 06/19/2014    Allergies: No Known Allergies Medications:  Current Outpatient Medications:    amoxicillin (AMOXIL) 500 MG capsule, Take 1 capsule (500 mg total) by mouth 2 (two) times daily for 10 days., Disp: 20 capsule, Rfl: 0   meloxicam (MOBIC) 15 MG tablet, Take  1 tablet (15 mg total) by mouth daily., Disp: 30 tablet, Rfl: 3   methocarbamol (ROBAXIN) 500 MG tablet, Take 1 tablet (500 mg total) by mouth every 8 (eight) hours as needed for muscle spasms., Disp: 60 tablet, Rfl: 3   omeprazole (PRILOSEC) 40 MG capsule, Take 1 capsule (40 mg total) by mouth daily., Disp: 90 capsule, Rfl:  1  Observations/Objective: Patient is well-developed, well-nourished in no acute distress.  Resting comfortably at home.  Head is normocephalic, atraumatic.  No labored breathing.  Speech is clear and coherent with logical content.  Patient is alert and oriented at baseline.    Assessment and Plan: 1. Bacterial pharyngitis - amoxicillin (AMOXIL) 500 MG capsule; Take 1 capsule (500 mg total) by mouth 2 (two) times daily for 10 days.  Dispense: 20 capsule; Refill: 0  - Suspect bacterial pharyngitis - Amoxil prescribed - Tylenol and Ibuprofen alternating every 4 hours - Salt water gargles - Chloraseptic spray - Liquid and soft food diet - Push fluids - New toothbrush in 3 days - Seek in person evaluation if not improving or if symptoms worsen   Follow Up Instructions: I discussed the assessment and treatment plan with the patient. The patient was provided an opportunity to ask questions and all were answered. The patient agreed with the plan and demonstrated an understanding of the instructions.  A copy of instructions were sent to the patient via MyChart unless otherwise noted below.    The patient was advised to call back or seek an in-person evaluation if the symptoms worsen or if the condition fails to improve as anticipated.  Time:  I spent 9 minutes with the patient via telehealth technology discussing the above problems/concerns.    Mar Daring, PA-C

## 2022-09-15 ENCOUNTER — Other Ambulatory Visit (HOSPITAL_COMMUNITY): Payer: Self-pay

## 2022-09-20 ENCOUNTER — Other Ambulatory Visit: Payer: Self-pay

## 2022-09-22 ENCOUNTER — Ambulatory Visit
Admission: RE | Admit: 2022-09-22 | Discharge: 2022-09-22 | Disposition: A | Discharge status: Home / Self Care | Payer: Medicaid Other | Source: Ambulatory Visit | Attending: Nurse Practitioner | Admitting: Nurse Practitioner

## 2022-09-22 DIAGNOSIS — Z1231 Encounter for screening mammogram for malignant neoplasm of breast: Secondary | ICD-10-CM

## 2022-09-26 ENCOUNTER — Other Ambulatory Visit: Payer: Self-pay | Admitting: Nurse Practitioner

## 2022-09-26 ENCOUNTER — Telehealth: Payer: Self-pay

## 2022-09-26 DIAGNOSIS — R928 Other abnormal and inconclusive findings on diagnostic imaging of breast: Secondary | ICD-10-CM

## 2022-09-26 NOTE — Telephone Encounter (Signed)
This encounter was created in error - please disregard.

## 2022-09-26 NOTE — Telephone Encounter (Signed)
Copied from CRM [id number]. Topic: General - Other >> Sep 26, 2022  9:53 AM Franchot Heidelberg wrote: Reason for CRM: Pt called back to discuss her recent imaging, NT needs notes from the PCP first

## 2022-09-29 ENCOUNTER — Other Ambulatory Visit: Payer: Self-pay

## 2022-10-03 NOTE — Telephone Encounter (Signed)
Pt has been informed of results

## 2022-10-11 ENCOUNTER — Ambulatory Visit
Admission: RE | Admit: 2022-10-11 | Discharge: 2022-10-11 | Disposition: A | Discharge status: Home / Self Care | Payer: Medicaid Other | Source: Ambulatory Visit | Attending: Nurse Practitioner | Admitting: Nurse Practitioner

## 2022-10-11 DIAGNOSIS — R928 Other abnormal and inconclusive findings on diagnostic imaging of breast: Secondary | ICD-10-CM

## 2022-10-12 IMAGING — CR DG FOOT COMPLETE 3+V*L*
3 series · 3 of 3 positions shown · non-contrast
Comparison: None Available.

CLINICAL DATA: Pain and swelling

EXAM:
LEFT FOOT - COMPLETE 3+ VIEW

[x foot ap left]
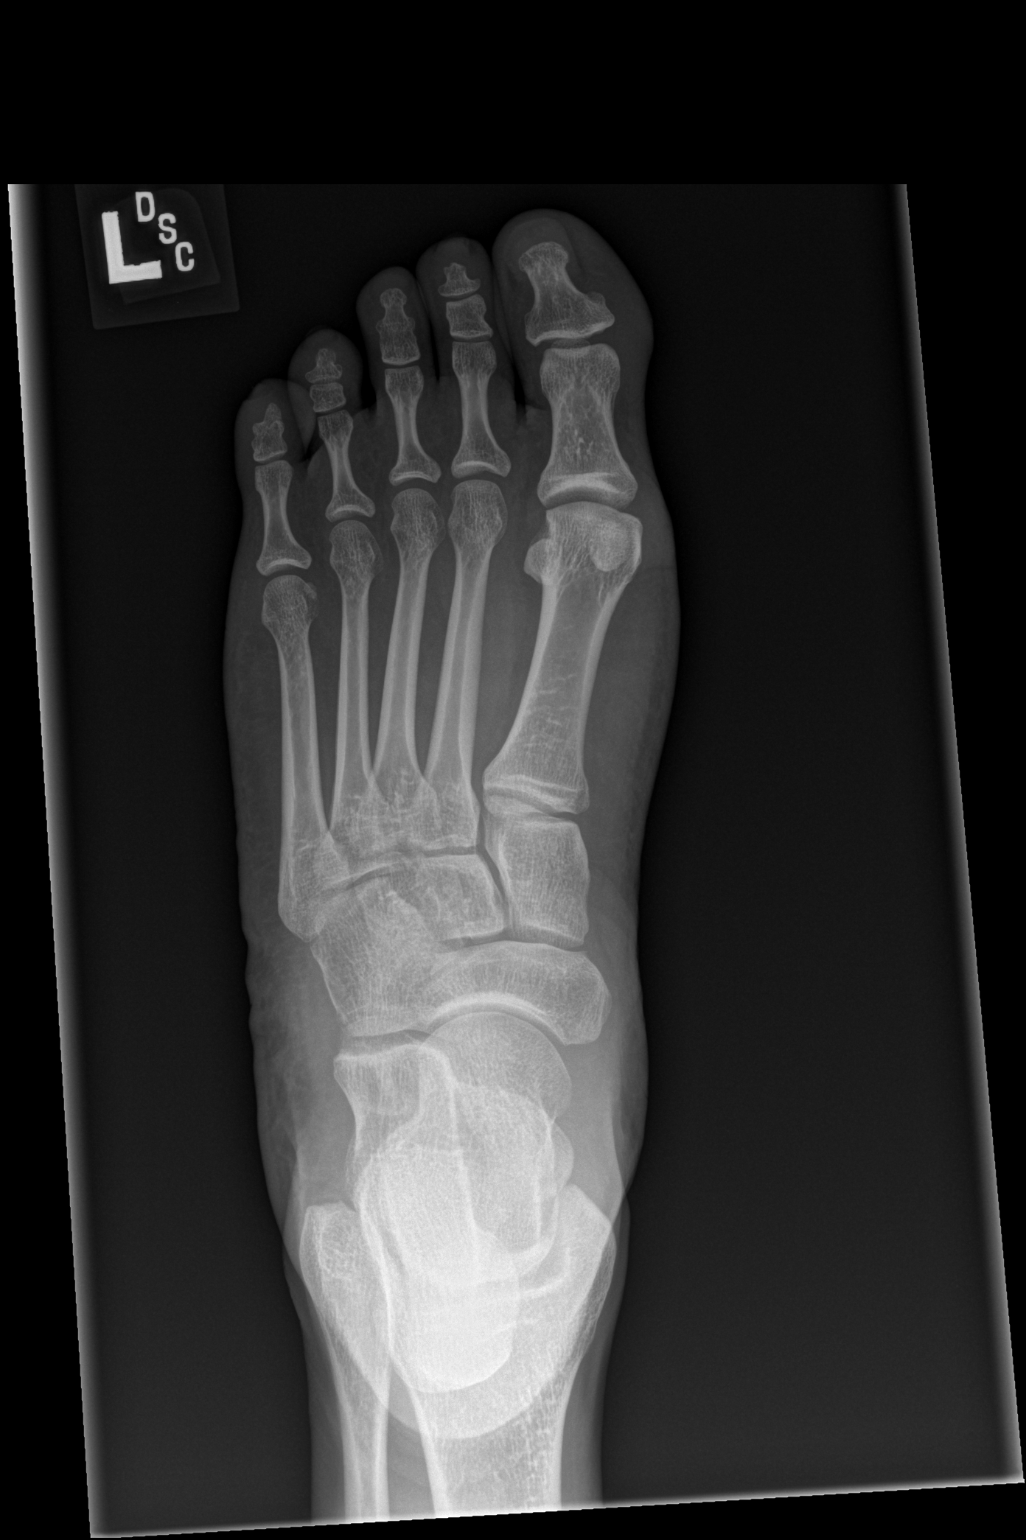

[x foot obl left]
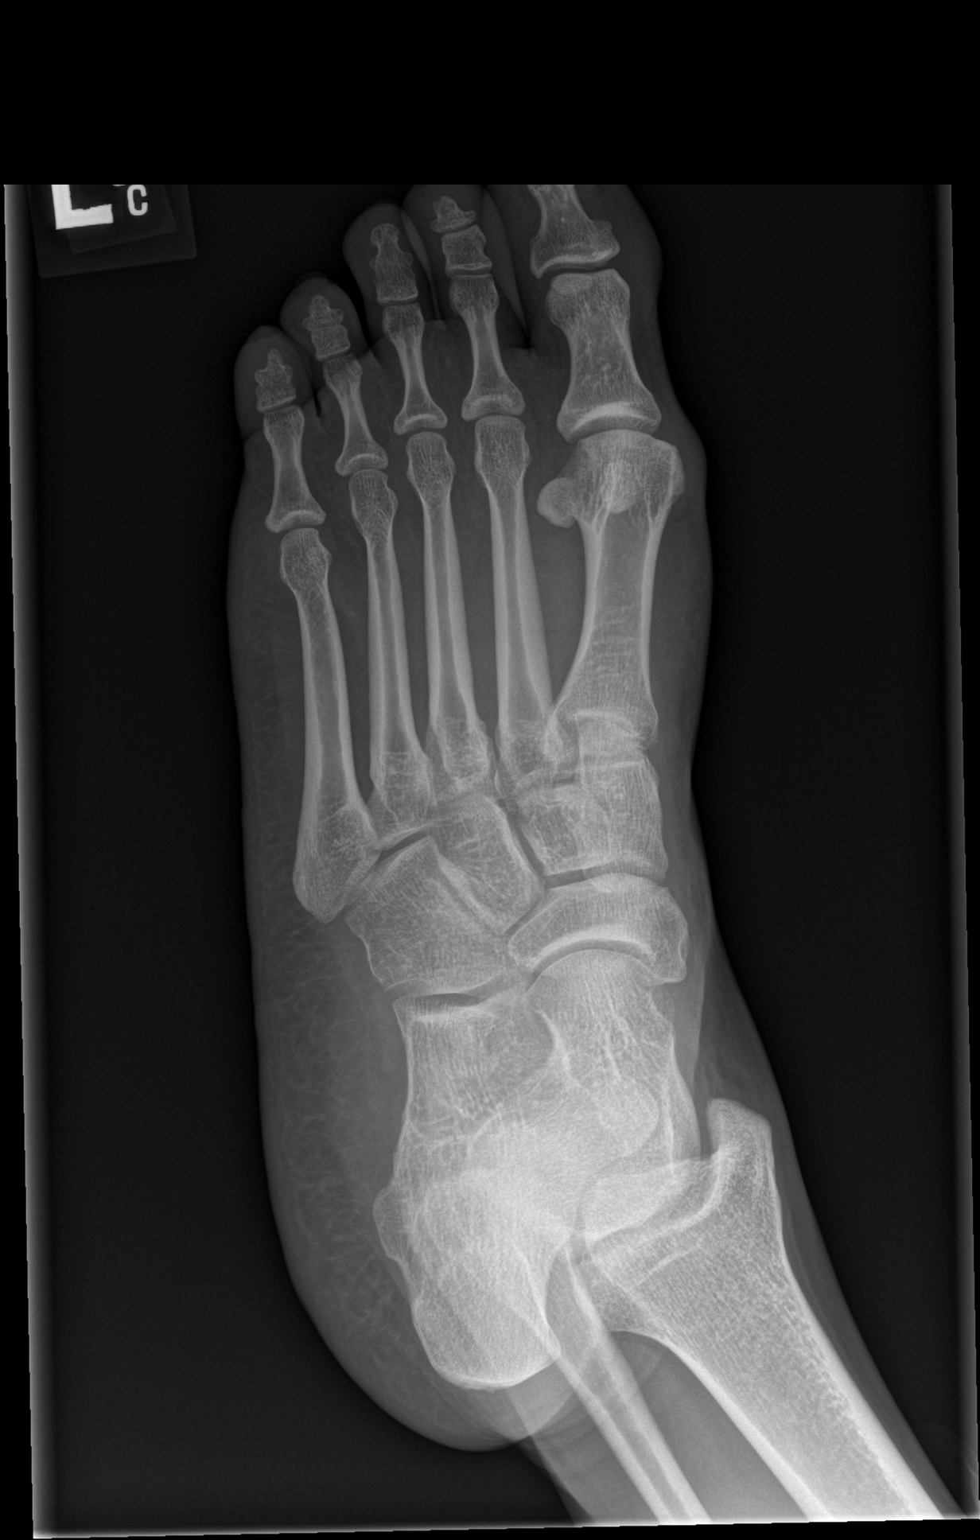

[x foot lat left]
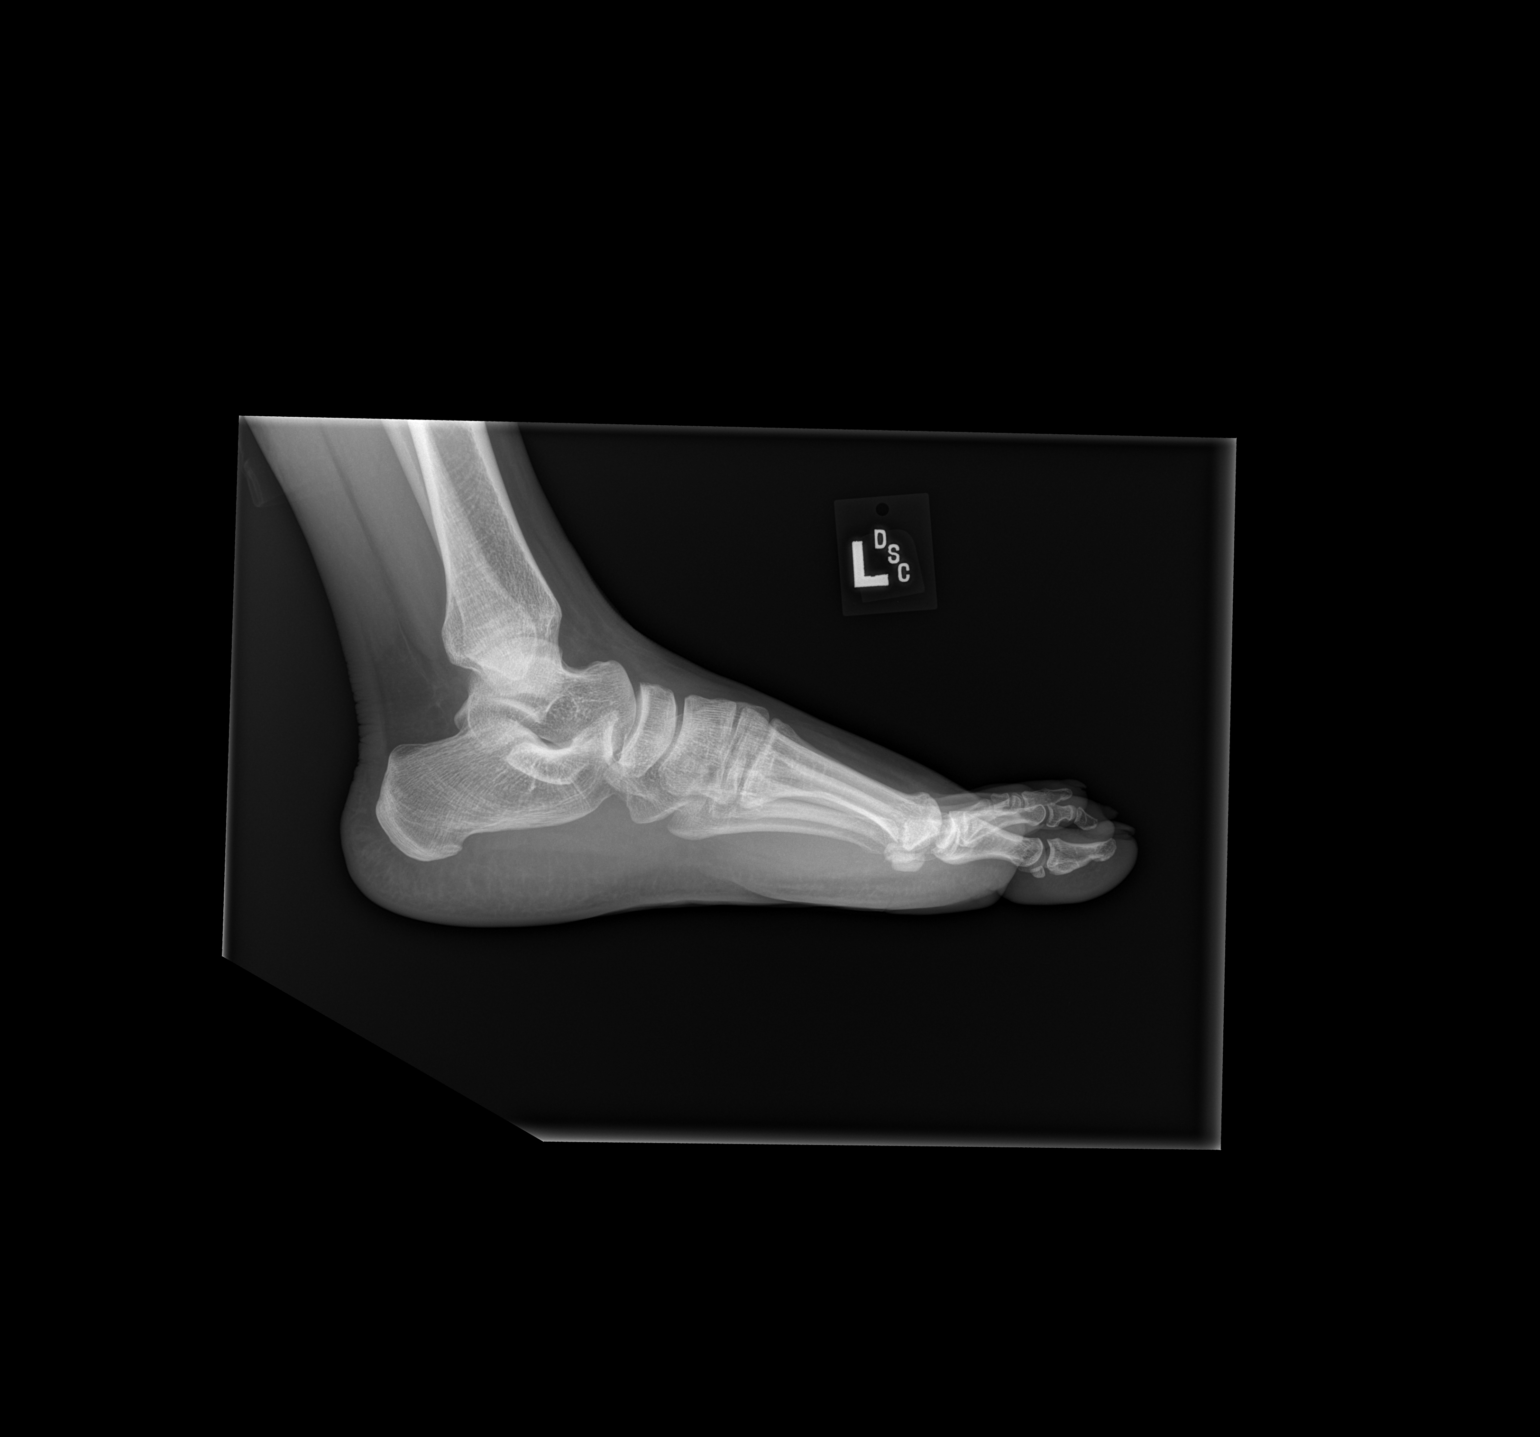

[3 of 3 positions shown; findings below may reference images not displayed]

FINDINGS: No fracture or dislocation is seen. There are possible tiny bony
spurs in the first metatarsophalangeal joint.
IMPRESSION: No fracture or dislocation is seen in the left foot.

## 2022-10-13 ENCOUNTER — Ambulatory Visit: Payer: Medicaid Other | Admitting: Orthopedic Surgery

## 2022-10-17 ENCOUNTER — Ambulatory Visit: Payer: Medicaid Other | Admitting: Podiatry

## 2022-11-07 ENCOUNTER — Telehealth: Payer: No Typology Code available for payment source | Admitting: Physician Assistant

## 2022-11-07 DIAGNOSIS — R52 Pain, unspecified: Secondary | ICD-10-CM | POA: Diagnosis not present

## 2022-11-07 NOTE — Patient Instructions (Signed)
  Crystal Koch, thank you for joining Crystal Climes, PA-C for today's virtual visit.  While this provider is not your primary care provider (PCP), if your PCP is located in our provider database this encounter information will be shared with them immediately following your visit.   A [facility] MyChart account gives you access to today's visit and all your visits, tests, and labs performed at [facility] " click here if you don't have a [facility] MyChart account or go to [email or web address]  Consent: (Patient) Crystal Koch provided verbal consent for this virtual visit at the beginning of the encounter.  Current Medications:  Current Outpatient Medications:    meloxicam (MOBIC) 15 MG tablet, Take 1 tablet (15 mg total) by mouth daily., Disp: 30 tablet, Rfl: 3   methocarbamol (ROBAXIN) 500 MG tablet, Take 1 tablet (500 mg total) by mouth every 8 (eight) hours as needed for muscle spasms., Disp: 60 tablet, Rfl: 3   omeprazole (PRILOSEC) 40 MG capsule, Take 1 capsule (40 mg total) by mouth daily., Disp: 90 capsule, Rfl: 1   Medications ordered in this encounter:  No orders of the defined types were placed in this encounter.    *If you need refills on other medications prior to your next appointment, please contact your pharmacy*  Follow-Up: Call back or seek an in-person evaluation if the symptoms worsen or if the condition fails to improve as anticipated.  [facility] [phone]  Other Instructions Please keep the skin clean and dry. Avoid for any redness, swelling or tenderness at the site which would indicate a bacterial infection of the skin and need for antibiotics. Use OTC extra strength Tylenol along with your once daily meloxicam. If symptoms or not easing up over the next 24 hours, or you note any worsening symptoms, I want you to be evaluated in person.  Please do not delay care.   If you have been instructed to have an in-person  evaluation today at a local Urgent Care facility, please use the link below. It will take you to a list of all of our available [facility], including address, phone number and hours of operation. Please do not delay care.  [facility]  If you or a family member do not have a primary care provider, use the link below to schedule a visit and establish care. When you choose a [facility] primary care physician or advanced practice provider, you gain a long-term partner in health. Find a Primary Care Provider  Learn more about [facility]'s in-office and virtual care options: [facility] - Get Care Now

## 2022-11-07 NOTE — Progress Notes (Signed)
Virtual Visit Consent   Crystal Koch, you are scheduled for a virtual visit with a [facility] provider today. Just as with appointments in the office, your consent must be obtained to participate. Your consent will be active for this visit and any virtual visit you may have with one of our providers in the next 365 days. If you have a MyChart account, a copy of this consent can be sent to you electronically.  As this is a virtual visit, video technology does not allow for your provider to perform a traditional examination. This may limit your provider's ability to fully assess your condition. If your provider identifies any concerns that need to be evaluated in person or the need to arrange testing (such as labs, EKG, etc.), we will make arrangements to do so. Although advances in technology are sophisticated, we cannot ensure that it will always work on either your end or our end. If the connection with a video visit is poor, the visit may have to be switched to a telephone visit. With either a video or telephone visit, we are not always able to ensure that we have a secure connection.  By engaging in this virtual visit, you consent to the provision of healthcare and authorize for your insurance to be billed (if applicable) for the services provided during this visit. Depending on your insurance coverage, you may receive a charge related to this service.  I need to obtain your verbal consent now. Are you willing to proceed with your visit today? Crystal Koch has provided verbal consent on 11/07/2022 for a virtual visit (video or telephone). Piedad Climes, [place]  Date: 11/07/2022 8:11 AM  Virtual Visit via Video Note   I, Piedad Climes, connected with  Crystal Koch  ([id number], June 07, 1981) on 11/07/22 at  7:45 AM EDT by a video-enabled telemedicine application and verified that I am speaking with the correct person using two identifiers.  Location: Patient: Virtual Visit Location Patient:  Home Provider: Virtual Visit Location Provider: Home Office   I discussed the limitations of evaluation and management by telemedicine and the availability of in person appointments. The patient expressed understanding and agreed to proceed.    History of Present Illness: Crystal Koch is a 41 y.o. who identifies as a female who was assigned female at birth, and is being seen today for a couple of days with body aches.  Denies fevers, chills. Denies URI symptoms.  Denies headache.  Denies change to activity level.  Had some travel to [place] about a month ago.  Denies any noted tick bites.  Does note 2 small bumps on her right thigh yesterday that formed "heads" and drained on their own.  Denies any continued drainage from that site.  Denies site tenderness or swelling.  Denies sick contact.  Did take a home COVID test that was negative.   HPI: HPI  Problems:  Patient Active Problem List   Diagnosis Date Noted   Acute non-recurrent sinusitis 05/31/2021   Vitamin D deficiency 06/22/2014   Sciatica of left side 06/19/2014   Current smoker 06/19/2014    Allergies: No Known Allergies Medications:  Current Outpatient Medications:    meloxicam (MOBIC) 15 MG tablet, Take 1 tablet (15 mg total) by mouth daily., Disp: 30 tablet, Rfl: 3   methocarbamol (ROBAXIN) 500 MG tablet, Take 1 tablet (500 mg total) by mouth every 8 (eight) hours as needed for muscle spasms., Disp: 60 tablet, Rfl: 3   omeprazole (PRILOSEC) 40 MG capsule,  Take 1 capsule (40 mg total) by mouth daily., Disp: 90 capsule, Rfl: 1  Observations/Objective: Patient is well-developed, well-nourished in no acute distress.  Resting comfortably at home.  Head is normocephalic, atraumatic.  No labored breathing. Speech is clear and coherent with logical content.  Patient is alert and oriented at baseline.   Assessment and Plan: 1. Body aches  Unclear etiology.  Viral is certainly a possibility but given the 2 marks noted on her leg  and constitutional symptoms without any signs of cellulitis, question reaction to a spider bite.  She is to keep the skin clean and dry monitor the skin for any further changes.  Supportive measures and OTC medications reviewed.  Tylenol OTC.  Meloxicam per orders.  Strict in person follow-up recommended.  Follow Up Instructions: I discussed the assessment and treatment plan with the patient. The patient was provided an opportunity to ask questions and all were answered. The patient agreed with the plan and demonstrated an understanding of the instructions.  A copy of instructions were sent to the patient via MyChart unless otherwise noted below.   The patient was advised to call back or seek an in-person evaluation if the symptoms worsen or if the condition fails to improve as anticipated.  Time:  I spent 10 minutes with the patient via telehealth technology discussing the above problems/concerns.    Piedad Climes, PA-C

## 2022-11-20 ENCOUNTER — Ambulatory Visit (INDEPENDENT_AMBULATORY_CARE_PROVIDER_SITE_OTHER): Payer: No Typology Code available for payment source | Admitting: Orthopedic Surgery

## 2022-11-20 ENCOUNTER — Other Ambulatory Visit: Payer: Self-pay

## 2022-11-20 DIAGNOSIS — M5416 Radiculopathy, lumbar region: Secondary | ICD-10-CM | POA: Diagnosis not present

## 2022-11-20 MED ORDER — GABAPENTIN 100 MG PO CAPS: 100.0000 mg | ORAL_CAPSULE | Freq: Three times a day (TID) | ORAL | 0 refills | Status: DC

## 2022-11-20 NOTE — Progress Notes (Addendum)
Orthopedic Spine Surgery Office Note   Assessment: Patient is a 41 y.o. female with low back pain that radiates into her left buttock and posterior thigh. 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 Got improvement of 80% with last injection     Plan: -Patient has tried activity modification, tylenol, NSAIDs, lumbar steroid injection -Prescribed gabapentin -Talked about microdiscectomy as a treatment option, but she wanted to try another injection since the first one worked well -Would need to be nicotine free prior to any elective spine surgery -Patient should return to office on an as needed basis     Patient expressed understanding of the plan and all questions were answered to the patient's satisfaction.    ___________________________________________________________________________   History: Patient is a 42 y.o. female who has been previously seen in the office for symptoms consistent with lumbar radiculopathy. She had done well after an injection. She got 80% relief and it last over 3 months. However, her pain has returned and she now feels pain in her left buttock and left posterior thigh. It does not radiate past the knee. Pain is worse with activity especially any bending or lifting activity. No pain in the right lower extremity. Denies paresthesias and numbness. No saddle anesthesia. No bowel or bladder incontinence.    Previous treatments: activity modification, tylenol, NSAIDs, lumbar steroid injection   Physical Exam:   General: no acute distress, appears stated age Neurologic: alert, answering questions appropriately, following commands Respiratory: unlabored breathing on room air, symmetric chest rise Psychiatric: appropriate affect, normal cadence to speech     MSK (spine):   -Strength exam                                                   Left                  Right EHL                              5/5                  5/5 TA                                  5/5                  5/5 GSC                             5/5                  5/5 Knee extension            5/5                  5/5 Hip flexion                    5/5                  5/5   -Sensory exam                           Sensation intact  to light touch in L3-S1 nerve distributions of bilateral lower extremities   -Achilles DTR: 2/4 on the left, 2/4 on the right -Patellar tendon DTR: 2/4 on the left, 2/4 on the right   -Straight leg raise: Negative bilaterally -Femoral nerve stretch test: Negative bilaterally -Clonus: no beats bilaterally   Imaging: XR of the lumbar spine from 07/14/2022 was previously independently reviewed and interpreted, showing no significant degenerative changes.  No fracture or dislocation.  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 No significant central, lateral recess, or foraminal stenosis.      Patient name: Crystal Koch Patient MRN: [id number] Date of visit: 11/20/22

## 2022-11-27 ENCOUNTER — Encounter: Payer: Self-pay | Admitting: Family Medicine

## 2022-11-28 ENCOUNTER — Encounter: Payer: Self-pay | Admitting: Podiatry

## 2022-11-28 ENCOUNTER — Ambulatory Visit (INDEPENDENT_AMBULATORY_CARE_PROVIDER_SITE_OTHER): Payer: Medicaid Other | Admitting: Podiatry

## 2022-11-28 DIAGNOSIS — M722 Plantar fascial fibromatosis: Secondary | ICD-10-CM | POA: Diagnosis not present

## 2022-11-28 MED ORDER — TRIAMCINOLONE ACETONIDE 40 MG/ML IJ SUSP
40.0000 mg | Freq: Once | INTRAMUSCULAR | Status: AC
Start: 2022-11-28 — End: 2022-11-28
  Administered 2022-11-28: 40 mg

## 2022-11-28 NOTE — Progress Notes (Signed)
She presents today for follow-up of her left foot states this really started to bother the right foot as well as starting to move up into the arch.  She states that is affecting my ability to work she is already trying to cut her hours down at work.  Objective: Vital signs stable alert oriented x 3.  Pulses are palpable she has considerable pain on palpation medial calcaneal tubercle of the left heel minimally so on the right heel.  No pain on medial lateral compression of the calcaneus.  Assessment: Planter fasciitis left greater than right.  Plan: I reinjected the bilateral heels today 20 mg Kenalog 5 mg Marcaine point maximal tenderness.  Tolerated procedure well without complications.  Also the right a note for her to take up sitting break every 2 hours for 10 to 15 minutes.  I will follow-up with her in 2 to 3 months or on an as-needed basis.

## 2022-11-30 ENCOUNTER — Encounter: Payer: Self-pay | Admitting: Family Medicine

## 2022-11-30 ENCOUNTER — Ambulatory Visit: Payer: No Typology Code available for payment source | Admitting: Orthopedic Surgery

## 2022-11-30 ENCOUNTER — Encounter: Payer: Self-pay | Admitting: Orthopedic Surgery

## 2022-12-01 ENCOUNTER — Telehealth: Payer: Self-pay | Admitting: Orthopedic Surgery

## 2022-12-01 NOTE — Telephone Encounter (Signed)
Copy of all records ready to be picked up at front desk. Auth attached for patient to sign.

## 2022-12-01 NOTE — Telephone Encounter (Signed)
Patient would like her medical records from Dr. Christell Constant printed out she will come by Monday to pick up and fill out Auth form

## 2022-12-04 ENCOUNTER — Other Ambulatory Visit: Payer: Self-pay

## 2022-12-06 ENCOUNTER — Other Ambulatory Visit: Payer: Self-pay

## 2022-12-07 ENCOUNTER — Other Ambulatory Visit: Payer: Self-pay

## 2022-12-17 ENCOUNTER — Other Ambulatory Visit: Payer: Self-pay | Admitting: Orthopedic Surgery

## 2022-12-21 ENCOUNTER — Encounter: Payer: Self-pay | Admitting: Family Medicine

## 2022-12-21 ENCOUNTER — Ambulatory Visit: Payer: Medicaid Other | Attending: Family Medicine | Admitting: Family Medicine

## 2022-12-21 ENCOUNTER — Other Ambulatory Visit: Payer: Self-pay

## 2022-12-21 ENCOUNTER — Ambulatory Visit (INDEPENDENT_AMBULATORY_CARE_PROVIDER_SITE_OTHER): Payer: Medicaid Other | Admitting: Physical Medicine and Rehabilitation

## 2022-12-21 VITALS — BP 124/80 | HR 87 | Temp 98.0°F | Ht 62.0 in | Wt 143.6 lb

## 2022-12-21 VITALS — BP 153/91 | HR 76

## 2022-12-21 DIAGNOSIS — M25562 Pain in left knee: Secondary | ICD-10-CM | POA: Diagnosis not present

## 2022-12-21 DIAGNOSIS — G8929 Other chronic pain: Secondary | ICD-10-CM

## 2022-12-21 DIAGNOSIS — G5603 Carpal tunnel syndrome, bilateral upper limbs: Secondary | ICD-10-CM | POA: Diagnosis not present

## 2022-12-21 DIAGNOSIS — Z13228 Encounter for screening for other metabolic disorders: Secondary | ICD-10-CM | POA: Diagnosis not present

## 2022-12-21 DIAGNOSIS — M5416 Radiculopathy, lumbar region: Secondary | ICD-10-CM

## 2022-12-21 DIAGNOSIS — R102 Pelvic and perineal pain unspecified side: Secondary | ICD-10-CM

## 2022-12-21 DIAGNOSIS — H538 Other visual disturbances: Secondary | ICD-10-CM

## 2022-12-21 MED ORDER — METHYLPREDNISOLONE ACETATE 80 MG/ML IJ SUSP
80.0000 mg | Freq: Once | INTRAMUSCULAR | Status: AC
  Administered 2022-12-21: 80 mg

## 2022-12-21 NOTE — Progress Notes (Signed)
Subjective:  Patient ID: Crystal Koch, female    DOB: 09/16/1980  Age: 42 y.o. MRN: [id number]  CC: Knee Pain   HPI Crystal Koch is a 42 y.o. year old female with a history of lumbar radiculopathy here for an office visit.  Interval History: Discussed the use of AI scribe software for clinical note transcription with the patient, who gave verbal consent to proceed.   The patient presents with multiple complaints. She report blurry vision and difficulty focusing, necessitating squinting and adjusting the distance of objects to read. She has not seen an ophthalmologist for this issue.  The patient also complains of left knee pain that has been present since May. The pain is exacerbated by prolonged sitting or standing and is associated with a perceived swelling. The pain is located in the front of the knee, but she also feel pressure in the back. The pain is rated as a 6 or 7 out of 10. She is currently taking gabapentin for nerve pain and meloxicam for the knee pain, which provides intermittent relief.  Additionally, she report right-sided pain that limits her walking speed. This pain is located in the lower back and is associated with sciatica symptoms. She is scheduled for an epidural injection later in the day.  The patient also experiences hand pain, described as a sharp sensation similar to pins and needles, when picking things up or braiding hair.  She is a Scientist, research (medical) for living .this pain affects all fingers and is present at night, often requiring her to shake her hands for relief.  Lastly, she report intermittent sharp pain at the base of the cervix for the past month. This pain is not associated with sexual intercourse but is noticeable when going to the bathroom. The pain did not resolve after her last menstrual cycle.        Past Medical History:  Diagnosis Date   Asthma 1986   Sciatica 2011   since son was born. exacerbated every winter.     Past Surgical History:   Procedure Laterality Date   CESAREAN SECTION  2011    Family History  Problem Relation Age of Onset   Cancer Maternal Grandmother    Cancer Father    Thyroid disease Mother    Hypertension Maternal Uncle    Diabetes Neg Hx     Social History   Socioeconomic History   Marital status: Single    Spouse name: Not on file   Number of children: 1   Years of education: college    Highest education level: Some college, no degree  Occupational History   Occupation: Homemaker   Tobacco Use   Smoking status: Every Day    Current packs/day: 0.50    Average packs/day: 0.5 packs/day for 16.0 years (8.0 ttl pk-yrs)    Types: Cigarettes   Smokeless tobacco: Never  Vaping Use   Vaping status: Never Used  Substance and Sexual Activity   Alcohol use: No   Drug use: Yes    Types: Marijuana   Sexual activity: Yes    Birth control/protection: None  Other Topics Concern   Not on file  Social History Narrative   Lives at home with 4 yo son.    Social Determinants of Health   Financial Resource Strain: High Risk (12/17/2022)   Overall Financial Resource Strain (CARDIA)    Difficulty of Paying Living Expenses: Hard  Food Insecurity: Food Insecurity Present (12/17/2022)   Hunger Vital Sign    Worried About Running  Out of Food in the Last Year: Often true    Ran Out of Food in the Last Year: Often true  Transportation Needs: Unmet Transportation Needs (12/17/2022)   PRAPARE - Transportation    Lack of Transportation (Medical): Yes    Lack of Transportation (Non-Medical): Yes  Physical Activity: Sufficiently Active (12/17/2022)   Exercise Vital Sign    Days of Exercise per Week: 5 days    Minutes of Exercise per Session: 150+ min  Stress: No Stress Concern Present (12/17/2022)   [employer] of Occupational Health - Occupational Stress Questionnaire    Feeling of Stress : Not at all  Social Connections: Unknown (12/17/2022)   Social Connection and Isolation Panel [NHANES]    Frequency  of Communication with Friends and Family: Not on file    Frequency of Social Gatherings with Friends and Family: Once a week    Attends Religious Services: Not on Marketing executive or Organizations: No    Attends Engineer, structural: Not on file    Marital Status: Separated    No Known Allergies  Outpatient Medications Prior to Visit  Medication Sig Dispense Refill   gabapentin (NEURONTIN) 100 MG capsule TAKE 1 CAPSULE(100 MG) BY MOUTH THREE TIMES DAILY 90 capsule 0   meloxicam (MOBIC) 15 MG tablet Take 1 tablet (15 mg total) by mouth daily. 30 tablet 3   methocarbamol (ROBAXIN) 500 MG tablet Take 1 tablet (500 mg total) by mouth every 8 (eight) hours as needed for muscle spasms. 60 tablet 3   omeprazole (PRILOSEC) 40 MG capsule Take 1 capsule (40 mg total) by mouth daily. 90 capsule 1   No facility-administered medications prior to visit.     ROS Review of Systems  Constitutional:  Negative for activity change and appetite change.  HENT:  Negative for sinus pressure and sore throat.   Respiratory:  Negative for chest tightness, shortness of breath and wheezing.   Cardiovascular:  Negative for chest pain and palpitations.  Gastrointestinal:  Negative for abdominal distention, abdominal pain and constipation.  Genitourinary: Negative.   Musculoskeletal: Negative.   Psychiatric/Behavioral:  Negative for behavioral problems and dysphoric mood.     Objective:  BP 124/80   Pulse 87   Temp 98 F (36.7 C) (Oral)   Ht 5\' 2"  (1.575 m)   Wt 143 lb 9.6 oz (65.1 kg)   SpO2 99%   BMI 26.26 kg/m      12/21/2022    1:01 PM 12/21/2022   10:57 AM 07/25/2022    2:56 PM  BP/Weight  Systolic BP 124 146 139  Diastolic BP 80 87 87  Wt. (Lbs)  143.6   BMI  26.26 kg/m2       Physical Exam Constitutional:      Appearance: She is well-developed.  Cardiovascular:     Rate and Rhythm: Normal rate.     Heart sounds: Normal heart sounds. No murmur  heard. Pulmonary:     Effort: Pulmonary effort is normal.     Breath sounds: Normal breath sounds. No wheezing or rales.  Chest:     Chest wall: No tenderness.  Abdominal:     General: Bowel sounds are normal. There is no distension.     Palpations: Abdomen is soft. There is no mass.     Tenderness: There is no abdominal tenderness.  Musculoskeletal:        General: Normal range of motion.     Right lower leg:  No edema.     Left lower leg: No edema.  Neurological:     Mental Status: She is alert and oriented to person, place, and time.  Psychiatric:        Mood and Affect: Mood normal.        Latest Ref Rng & Units 03/02/2022   10:15 AM 08/17/2021   11:45 AM 03/24/2021   10:59 AM  CMP  Glucose 70 - 99 mg/dL 75  80  74   BUN 6 - 24 mg/dL 14  10  10    Creatinine 0.57 - 1.00 mg/dL 1.61  0.96  0.45   Sodium 134 - 144 mmol/L 141  140  142   Potassium 3.5 - 5.2 mmol/L 4.0  4.0  3.9   Chloride 96 - 106 mmol/L 104  101  104   CO2 20 - 29 mmol/L 23  25  24    Calcium 8.7 - 10.2 mg/dL 9.0  40.9  9.3   Total Protein 6.0 - 8.5 g/dL  6.9  6.8   Total Bilirubin 0.0 - 1.2 mg/dL  0.3  <8.1   Alkaline Phos 44 - 121 IU/L  68  65   AST 0 - 40 IU/L  18  22   ALT 0 - 32 IU/L  18  27     Lipid Panel     Component Value Date/Time   CHOL 198 06/01/2022 1150   TRIG 68 06/01/2022 1150   HDL 42 06/01/2022 1150   CHOLHDL 5.0 (H) 03/24/2021 1059   LDLCALC 144 (H) 06/01/2022 1150    CBC    Component Value Date/Time   WBC 8.4 03/02/2022 1015   WBC 6.7 06/19/2014 0954   RBC 5.15 03/02/2022 1015   RBC 4.55 06/19/2014 0954   HGB 14.3 03/02/2022 1015   HCT 44.1 03/02/2022 1015   PLT 379 03/02/2022 1015   MCV 86 03/02/2022 1015   MCH 27.8 03/02/2022 1015   MCH 28.8 06/19/2014 0954   MCHC 32.4 03/02/2022 1015   MCHC 33.8 06/19/2014 0954   RDW 13.4 03/02/2022 1015   LYMPHSABS 3.3 (H) 03/02/2022 1015   EOSABS 0.2 03/02/2022 1015   BASOSABS 0.1 03/02/2022 1015    Lab Results  Component  Value Date   HGBA1C 5.8 (H) 03/02/2022    Assessment & Plan:      Visual Impairment: Blurry vision, difficulty focusing. No recent ophthalmology evaluation. -Refer to ophthalmology for further evaluation.  Left Knee Pain: Pain in the front of the knee with pressure in the back, exacerbated by prolonged sitting or standing. Mild swelling noted. Pain rated as 6-7/10. Partial relief with gabapentin and meloxicam. -Refer to physical therapy for targeted exercises. -Consider over-the-counter anti-inflammatory supplements like turmeric. -Consider use of a knee brace and ice for symptomatic relief.  Carpal Tunnel Syndrome: Bilateral hand pain, numbness, and tingling, worse at night. Symptoms interfere with daily activities and work. -Recommend wrist braces for both hands. -Continue gabapentin. -Consider referral to orthopedics for possible steroid injections or carpal tunnel release surgery if symptoms persist despite conservative management.  Right number radiculopathy: Pain in the right lower back and sciatica, managed by orthopedics. -Continue with scheduled epidural injection.  Cervical Pain: Intermittent sharp pain in the cervix for the past month, not related to sexual intercourse. Pain noted during menstruation and while urinating. -Order vaginal swab and urine test after the end of the current menstrual cycle to rule out infection or other causes.  She will return on Monday for this.  General Health Maintenance: -Check kidney function due to ongoing use of meloxicam. -Ensure adequate refills of current medications (gabapentin, meloxicam).          No orders of the defined types were placed in this encounter.   Follow-up: Return in about 6 months (around 06/23/2023) for Chronic medical conditions.       Hoy Register, MD, FAAFP. [facility] [place], [place] [phone]   12/21/2022, 1:01 PM

## 2022-12-21 NOTE — Progress Notes (Signed)
Referral to eye doctor Left knee pain, right thigh pain Hand pain.

## 2022-12-21 NOTE — Patient Instructions (Signed)

## 2022-12-21 NOTE — Patient Instructions (Signed)

## 2022-12-21 NOTE — Progress Notes (Signed)
Functional Pain Scale - descriptive words and definitions  Distressing (6)    Pain is present/unable to complete most ADLs limited by pain/sleep is difficult and active distraction is only marginal. Moderate range order  Average Pain 6   +Driver, -BT, -Dye Allergies.  Lower back pain on both sides that radiates into both legs

## 2022-12-22 ENCOUNTER — Other Ambulatory Visit: Payer: Self-pay | Admitting: Family Medicine

## 2022-12-22 DIAGNOSIS — F419 Anxiety disorder, unspecified: Secondary | ICD-10-CM

## 2022-12-22 LAB — CMP14+EGFR
ALT: 11 IU/L (ref 0–32)
AST: 11 IU/L (ref 0–40)
Albumin: 4.1 g/dL (ref 3.9–4.9)
Alkaline Phosphatase: 65 IU/L (ref 44–121)
BUN/Creatinine Ratio: 16 (ref 9–23)
BUN: 11 mg/dL (ref 6–24)
Bilirubin Total: 0.3 mg/dL (ref 0.0–1.2)
CO2: 22 mmol/L (ref 20–29)
Calcium: 9.2 mg/dL (ref 8.7–10.2)
Chloride: 104 mmol/L (ref 96–106)
Creatinine, Ser: 0.67 mg/dL (ref 0.57–1.00)
Globulin, Total: 2.4 g/dL (ref 1.5–4.5)
Glucose: 75 mg/dL (ref 70–99)
Potassium: 4.2 mmol/L (ref 3.5–5.2)
Sodium: 138 mmol/L (ref 134–144)
Total Protein: 6.5 g/dL (ref 6.0–8.5)
eGFR: 112 mL/min/{1.73_m2} (ref >59)

## 2022-12-23 ENCOUNTER — Other Ambulatory Visit: Payer: Self-pay | Admitting: Orthopedic Surgery

## 2022-12-25 ENCOUNTER — Ambulatory Visit: Payer: Medicaid Other | Attending: Family Medicine

## 2022-12-25 ENCOUNTER — Encounter: Payer: Self-pay | Admitting: Physical Medicine and Rehabilitation

## 2022-12-25 ENCOUNTER — Other Ambulatory Visit (HOSPITAL_COMMUNITY)
Admission: RE | Admit: 2022-12-25 | Discharge: 2022-12-25 | Disposition: A | Discharge status: Home / Self Care | Payer: Medicaid Other | Source: Ambulatory Visit | Attending: Family Medicine | Admitting: Family Medicine

## 2022-12-25 ENCOUNTER — Telehealth: Payer: Self-pay | Admitting: Family Medicine

## 2022-12-25 DIAGNOSIS — R102 Pelvic and perineal pain: Secondary | ICD-10-CM | POA: Insufficient documentation

## 2022-12-25 NOTE — Telephone Encounter (Signed)
Test has been released

## 2022-12-25 NOTE — Telephone Encounter (Signed)
Antesia from [facility] called in, states ancillary std orders didn't cross over

## 2022-12-25 NOTE — Telephone Encounter (Signed)
Note updated

## 2022-12-26 ENCOUNTER — Other Ambulatory Visit: Payer: Self-pay

## 2022-12-26 ENCOUNTER — Other Ambulatory Visit: Payer: Self-pay | Admitting: Family Medicine

## 2022-12-26 ENCOUNTER — Encounter: Payer: Self-pay | Admitting: Family Medicine

## 2022-12-26 DIAGNOSIS — M5416 Radiculopathy, lumbar region: Secondary | ICD-10-CM

## 2022-12-26 LAB — CERVICOVAGINAL ANCILLARY ONLY
Bacterial Vaginitis (gardnerella): POSITIVE — AB
Candida Glabrata: NEGATIVE
Candida Vaginitis: NEGATIVE
Chlamydia: NEGATIVE
Comment: NEGATIVE
Comment: NEGATIVE
Comment: NEGATIVE
Comment: NEGATIVE
Comment: NEGATIVE
Comment: NORMAL
Neisseria Gonorrhea: NEGATIVE
Trichomonas: NEGATIVE

## 2022-12-26 MED ORDER — METRONIDAZOLE 500 MG PO TABS
500.0000 mg | ORAL_TABLET | Freq: Two times a day (BID) | ORAL | 0 refills | Status: AC
  Filled 2022-12-26: qty 14, 7d supply, fill #0

## 2022-12-27 ENCOUNTER — Other Ambulatory Visit: Payer: Self-pay

## 2022-12-29 ENCOUNTER — Encounter: Payer: Self-pay | Admitting: Family Medicine

## 2023-01-01 ENCOUNTER — Other Ambulatory Visit: Payer: Self-pay | Admitting: Family Medicine

## 2023-01-01 MED ORDER — CLINDAMYCIN PHOSPHATE 2 % VA CREA: 1.0000 | TOPICAL_CREAM | Freq: Every day | VAGINAL | 0 refills | Status: DC

## 2023-01-02 ENCOUNTER — Other Ambulatory Visit: Payer: Self-pay

## 2023-01-03 ENCOUNTER — Ambulatory Visit: Payer: No Typology Code available for payment source | Admitting: Family Medicine

## 2023-01-04 NOTE — Therapy (Signed)
OUTPATIENT PHYSICAL THERAPY THORACOLUMBAR EVALUATION   Patient Name: Crystal Koch MRN: [id number] DOB:1981/05/14, 42 y.o., female Today's Date: 01/05/2023  END OF SESSION:  PT End of Session - 01/05/23 1006     Visit Number 1    Date for PT Re-Evaluation 03/16/23    Authorization Type [place] [employer]    PT Start Time 1010    PT Stop Time 1055    PT Time Calculation (min) 45 min    Activity Tolerance Patient tolerated treatment well    Behavior During Therapy WFL for tasks assessed/performed             Past Medical History:  Diagnosis Date   Asthma 1986   Sciatica 2011   since son was born. exacerbated every winter.    Past Surgical History:  Procedure Laterality Date   CESAREAN SECTION  2011   Patient Active Problem List   Diagnosis Date Noted   Acute non-recurrent sinusitis 05/31/2021   Vitamin D deficiency 06/22/2014   Sciatica of left side 06/19/2014   Current smoker 06/19/2014    PCP: Hoy Register  REFERRING PROVIDER: Willia Craze  REFERRING DIAG: Foot drop, LBP   Rationale for Evaluation and Treatment: Rehabilitation  THERAPY DIAG:  Acute pain of left knee  Other low back pain  Chronic low back pain with left-sided sciatica, unspecified back pain laterality  Plantar fasciitis of left foot  ONSET DATE: "sometime this year for the knee but the sciatica and plantar fasciitis has been years"   SUBJECTIVE:                                                                                                                                                                                           SUBJECTIVE STATEMENT: I have sciatica in my l side that shoots all the way down. I have plantar fasciitis in my L foot that shoots up. I got steroids in my foot and back that has helped with my pain, this was second time around for both. My L knee is aching.   PERTINENT HISTORY:  Hx of sciatica since 2011  PAIN:  Are you having pain? Yes: NPRS scale: 5/10 Pain  location: L knee, under the knee cap and around it Pain description: sharp, shooting, dull, throbbing, pressure Aggravating factors: being on it for too long (1-1.5hrs)  Relieving factors: nothing really,   PRECAUTIONS: None  RED FLAGS: None   WEIGHT BEARING RESTRICTIONS: No  FALLS:  Has patient fallen in last 6 months? 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   PLOF: Independent  PATIENT GOALS: lessen the pain and inflammation, get it to where I can stand for a longer period of time without persistent pain    OBJECTIVE:   DIAGNOSTIC FINDINGS:  N/A  PATIENT SURVEYS:  FOTO 55  COGNITION: Overall cognitive status: Within functional limits for tasks assessed     SENSATION: WFL  MUSCLE LENGTH: Hamstrings: some pain and tightness   POSTURE: No Significant postural limitations  PALPATION: No TTP  LUMBAR ROM:   AROM eval  Flexion WFL   Extension End range pain  Right lateral flexion Mild tightness  Left lateral flexion WFL  Right rotation WFL  Left rotation WFL   (Blank rows = not tested)  LOWER EXTREMITY ROM:   WFL, pain with R knee extension   LOWER EXTREMITY MMT:  5/5 RLE, 4/5 L knee extension and flexion   LUMBAR SPECIAL TESTS:  Straight leg raise test: Positive  FUNCTIONAL TESTS:  5 times sit to stand: 14.81s   TODAY'S TREATMENT:                                                                                                                              DATE: 01/05/23- EVAL    PATIENT EDUCATION:  Education details: POC and HEP Person educated: Patient Education method: Explanation Education comprehension: verbalized understanding  HOME EXERCISE PROGRAM: Access Code: [email or web address] URL: [email or web address] Date: 01/05/2023 Prepared by: Cassie Freer  Exercises - Gastroc Stretch on Wall  - 2 x  daily - 7 x weekly - 30 hold - Heel Raises with Counter Support  - 1 x daily - 7 x weekly - 2 sets - 10 reps - Sit to Stand  - 1 x daily - 7 x weekly - 2 sets - 10 reps - Side Stepping with Resistance at Ankles  - 1 x daily - 7 x weekly - 2 sets - 10 reps - Supine Bridge  - 1 x daily - 7 x weekly - 2 sets - 10 reps - Supine Figure 4 Piriformis Stretch  - 2 x daily - 7 x weekly - 30 hold  ASSESSMENT:  CLINICAL IMPRESSION: Patient is a 42 y.o. female who was seen today for physical therapy evaluation and treatment for L knee pain. She also reports L sided sciatica and L foot plantar fasciitis but these are chronic issues, whereas the knee pain started earlier this year. She reports 5/10 pain overall and has some weakness in her L side but overall is doing well. This could be due to recent steroid injections. We will take advantage of her low pain levels to work on some mobility and strengthening to get her moving better. Her main goal is to be able to increase her standing tolerance and decrease her pain. Patient will benefit from skilled PT to address her impairments and work on strength, stability, and total body mobility.   OBJECTIVE IMPAIRMENTS: decreased strength  and pain.   ACTIVITY LIMITATIONS: standing and locomotion level  REHAB POTENTIAL: Good  CLINICAL DECISION MAKING: Stable/uncomplicated  EVALUATION COMPLEXITY: Low  GOALS: Goals reviewed with patient? Yes  SHORT TERM GOALS: Target date: 02/09/23  Patient will be independent with initial HEP. Goal status: INITIAL   LONG TERM GOALS: Target date: 03/16/23  Patient will be independent with advanced/ongoing HEP to improve outcomes and carryover.  Goal status: INITIAL  2.  Patient will report at least 75% improvement in L knee pain and less than 2/10 overall pain to improve QOL. Baseline: 5/10 Goal status: INITIAL  3.  Patient will increased standing tolerance to over one hour without pain. Baseline: needs break after 1  hr Goal status: INITIAL  4.  Patient will report 18 on FOTO (patient reported outcome measure) to demonstrate improved functional ability. Baseline: 55 Goal status: INITIAL  PLAN:  PT FREQUENCY: 1-2x/week  PT DURATION: 10 weeks  PLANNED INTERVENTIONS: Therapeutic exercises, Therapeutic activity, Neuromuscular re-education, Balance training, Gait training, Patient/Family education, Self Care, Joint mobilization, Dry Needling, Electrical stimulation, Cryotherapy, Moist heat, Traction, Ionotophoresis 4mg /ml Dexamethasone, and Manual therapy.  PLAN FOR NEXT SESSION: stretching    Cassie Freer, PT 01/05/2023, 10:59 AM

## 2023-01-05 ENCOUNTER — Other Ambulatory Visit: Payer: Self-pay | Admitting: Family Medicine

## 2023-01-05 ENCOUNTER — Ambulatory Visit: Payer: Medicaid Other | Attending: Family Medicine

## 2023-01-05 ENCOUNTER — Other Ambulatory Visit: Payer: Self-pay

## 2023-01-05 DIAGNOSIS — M5442 Lumbago with sciatica, left side: Secondary | ICD-10-CM | POA: Insufficient documentation

## 2023-01-05 DIAGNOSIS — M25562 Pain in left knee: Secondary | ICD-10-CM | POA: Diagnosis not present

## 2023-01-05 DIAGNOSIS — M722 Plantar fascial fibromatosis: Secondary | ICD-10-CM

## 2023-01-05 DIAGNOSIS — M5459 Other low back pain: Secondary | ICD-10-CM | POA: Diagnosis not present

## 2023-01-05 DIAGNOSIS — G8929 Other chronic pain: Secondary | ICD-10-CM | POA: Diagnosis not present

## 2023-01-05 MED ORDER — METRONIDAZOLE 0.75 % VA GEL
1.0000 | Freq: Every day | VAGINAL | 0 refills | Status: AC
  Filled 2023-01-05: qty 70, 7d supply, fill #0

## 2023-01-05 NOTE — Telephone Encounter (Signed)
Insurance will cover -Metronidazole vaginal gel

## 2023-01-09 ENCOUNTER — Other Ambulatory Visit: Payer: Self-pay

## 2023-01-10 NOTE — Procedures (Signed)
S1 Lumbosacral Transforaminal Epidural Steroid Injection - Sub-Pedicular Approach with Fluoroscopic Guidance   Patient: Crystal Koch      Date of Birth: January 23, 1981 MRN: [id number] PCP: Hoy Register, MD      Visit Date: 12/21/2022   Universal Protocol:    Date/Time: 07/31/246:52 AM  Consent Given By: the patient  Position:  PRONE  Additional Comments: Vital signs were monitored before and after the procedure. Patient was prepped and draped in the usual sterile fashion. The correct patient, procedure, and site was verified.   Injection Procedure Details:  Procedure Site One Meds Administered:  Meds ordered this encounter  Medications   methylPREDNISolone acetate (DEPO-MEDROL) injection 80 mg    Laterality: Left  Location/Site:  S1 Foramen   Needle size: 22 ga.  Needle type: Spinal  Needle Placement: Transforaminal  Findings:   -Comments: Excellent flow of contrast along the nerve, nerve root and into the epidural space.  Epidurogram: Contrast epidurogram showed no nerve root cut off or restricted flow pattern.  Procedure Details: After squaring off the sacral end-plate to get a true AP view, the C-arm was positioned so that the best possible view of the S1 foramen was visualized. The soft tissues overlying this structure were infiltrated with 2-3 ml. of 1% Lidocaine without Epinephrine.    The spinal needle was inserted toward the target using a "trajectory" view along the fluoroscope beam.  Under AP and lateral visualization, the needle was advanced so it did not puncture dura. Biplanar projections were used to confirm position. Aspiration was confirmed to be negative for CSF and/or blood. A 1-2 ml. volume of Isovue-250 was injected and flow of contrast was noted at each level. Radiographs were obtained for documentation purposes.   After attaining the desired flow of contrast documented above, a 0.5 to 1.0 ml test dose of 0.25% Marcaine was injected into each  respective transforaminal space.  The patient was observed for 90 seconds post injection.  After no sensory deficits were reported, and normal lower extremity motor function was noted,   the above injectate was administered so that equal amounts of the injectate were placed at each foramen (level) into the transforaminal epidural space.   Additional Comments:  No complications occurred Dressing: Band-Aid with 2 x 2 sterile gauze    Post-procedure details: Patient was observed during the procedure. Post-procedure instructions were reviewed.  Patient left the clinic in stable condition.

## 2023-01-10 NOTE — Progress Notes (Signed)
Crystal Koch - 42 y.o. female MRN [id number]  Date of birth: [birth year]  Office Visit Note: Visit Date: 12/21/2022 PCP: Hoy Register, MD Referred by: London Sheer, MD  Subjective: Chief Complaint  Patient presents with   Lower Back - Pain   HPI:  Crystal Koch is a 42 y.o. female who comes in today for planned repeat Left S1-2  Lumbar Transforaminal epidural steroid injection with fluoroscopic guidance.  The patient has failed conservative care including home exercise, medications, time and activity modification.  This injection will be diagnostic and hopefully therapeutic.  Please see requesting physician notes for further details and justification. Patient received more than 50% pain relief from prior injection.   Referring: Dr. Willia Craze   ROS Otherwise per HPI.  Assessment & Plan: Visit Diagnoses:    ICD-10-CM   1. Lumbar radiculopathy  M54.16 XR C-ARM NO REPORT    Epidural Steroid injection    methylPREDNISolone acetate (DEPO-MEDROL) injection 80 mg      Plan: No additional findings.   Meds & Orders:  Meds ordered this encounter  Medications   methylPREDNISolone acetate (DEPO-MEDROL) injection 80 mg    Orders Placed This Encounter  Procedures   XR C-ARM NO REPORT   Epidural Steroid injection    Follow-up: Return for visit to requesting provider as needed.   Procedures: No procedures performed  S1 Lumbosacral Transforaminal Epidural Steroid Injection - Sub-Pedicular Approach with Fluoroscopic Guidance   Patient: Crystal Koch      Date of Birth: 1981/01/14 MRN: [id number] PCP: Hoy Register, MD      Visit Date: 12/21/2022   Universal Protocol:    Date/Time: 07/31/246:52 AM  Consent Given By: the patient  Position:  PRONE  Additional Comments: Vital signs were monitored before and after the procedure. Patient was prepped and draped in the usual sterile fashion. The correct patient, procedure, and site was verified.   Injection Procedure  Details:  Procedure Site One Meds Administered:  Meds ordered this encounter  Medications   methylPREDNISolone acetate (DEPO-MEDROL) injection 80 mg    Laterality: Left  Location/Site:  S1 Foramen   Needle size: 22 ga.  Needle type: Spinal  Needle Placement: Transforaminal  Findings:   -Comments: Excellent flow of contrast along the nerve, nerve root and into the epidural space.  Epidurogram: Contrast epidurogram showed no nerve root cut off or restricted flow pattern.  Procedure Details: After squaring off the sacral end-plate to get a true AP view, the C-arm was positioned so that the best possible view of the S1 foramen was visualized. The soft tissues overlying this structure were infiltrated with 2-3 ml. of 1% Lidocaine without Epinephrine.    The spinal needle was inserted toward the target using a "trajectory" view along the fluoroscope beam.  Under AP and lateral visualization, the needle was advanced so it did not puncture dura. Biplanar projections were used to confirm position. Aspiration was confirmed to be negative for CSF and/or blood. A 1-2 ml. volume of Isovue-250 was injected and flow of contrast was noted at each level. Radiographs were obtained for documentation purposes.   After attaining the desired flow of contrast documented above, a 0.5 to 1.0 ml test dose of 0.25% Marcaine was injected into each respective transforaminal space.  The patient was observed for 90 seconds post injection.  After no sensory deficits were reported, and normal lower extremity motor function was noted,   the above injectate was administered so that equal amounts of the injectate were placed  at each foramen (level) into the transforaminal epidural space.   Additional Comments:  No complications occurred Dressing: Band-Aid with 2 x 2 sterile gauze    Post-procedure details: Patient was observed during the procedure. Post-procedure instructions were reviewed.  Patient left the  clinic in stable condition.   Clinical History: MRI LUMBAR SPINE WITHOUT CONTRAST   TECHNIQUE: Multiplanar, multisequence MR imaging of the lumbar spine was performed. No intravenous contrast was administered.   COMPARISON:  None Available.   FINDINGS: Segmentation:  5 lumbar type vertebrae   Alignment:  Physiologic.   Vertebrae:  No fracture, evidence of discitis, or bone lesion.   Conus medullaris and cauda equina: Conus extends to the L2 level. Conus and cauda equina appear normal.   Paraspinal and other soft tissues: Negative for perispinal mass or inflammation. 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   Disc levels:   Diffusely preserved disc height and hydration. Left paracentral herniation at L5-S1 which impinges on the left S1 nerve root at the subarticular recess. Negative facets and foramina.   IMPRESSION: L5-S1 left paracentral protrusion impinging on the left S1 nerve root.     Electronically Signed   By: Tiburcio Pea M.D.   On: 03/15/2022 05:31     Objective:  VS:  HT:    WT:   BMI:     BP:(!) 153/91  HR:76bpm  TEMP: ( )  RESP:  Physical Exam Vitals and nursing note reviewed.  Constitutional:      General: She is not in acute distress.    Appearance: Normal appearance. She is not ill-appearing.  HENT:     Head: Normocephalic and atraumatic.     Right Ear: External ear normal.     Left Ear: External ear normal.  Eyes:     Extraocular Movements: Extraocular movements intact.  Cardiovascular:     Rate and Rhythm: Normal rate.     Pulses: Normal pulses.  Pulmonary:     Effort: Pulmonary effort is normal. No respiratory distress.  Abdominal:     General: There is no distension.     Palpations: Abdomen is soft.  Musculoskeletal:        General: Tenderness present.     Cervical back: Neck supple.     Right lower leg: No edema.     Left lower leg: No edema.     Comments: Patient has good distal strength  with no pain over the greater trochanters.  No clonus or focal weakness.  Skin:    Findings: No erythema, lesion or rash.  Neurological:     General: No focal deficit present.     Mental Status: She is alert and oriented to person, place, and time.     Sensory: No sensory deficit.     Motor: No weakness or abnormal muscle tone.     Coordination: Coordination normal.  Psychiatric:        Mood and Affect: Mood normal.        Behavior: Behavior normal.      Imaging: No results found.

## 2023-01-15 ENCOUNTER — Ambulatory Visit: Payer: Medicaid Other | Attending: Family Medicine

## 2023-01-15 DIAGNOSIS — G8929 Other chronic pain: Secondary | ICD-10-CM | POA: Insufficient documentation

## 2023-01-15 DIAGNOSIS — M722 Plantar fascial fibromatosis: Secondary | ICD-10-CM | POA: Diagnosis not present

## 2023-01-15 DIAGNOSIS — M25562 Pain in left knee: Secondary | ICD-10-CM | POA: Diagnosis not present

## 2023-01-15 DIAGNOSIS — M5459 Other low back pain: Secondary | ICD-10-CM | POA: Diagnosis not present

## 2023-01-15 DIAGNOSIS — M5442 Lumbago with sciatica, left side: Secondary | ICD-10-CM | POA: Insufficient documentation

## 2023-01-15 NOTE — Therapy (Signed)
OUTPATIENT PHYSICAL THERAPY THORACOLUMBAR TREATMENT   Patient Name: Crystal Koch MRN: [id number] DOB:1980-07-26, 42 y.o., female Today's Date: 01/15/2023  END OF SESSION:  PT End of Session - 01/15/23 1613     Visit Number 2    Date for PT Re-Evaluation 03/16/23    Authorization Type [place] Medicaid    PT Start Time 1615    PT Stop Time 1700    PT Time Calculation (min) 45 min    Activity Tolerance Patient tolerated treatment well    Behavior During Therapy [facility] for tasks assessed/performed              Past Medical History:  Diagnosis Date   Asthma 1986   Sciatica 2011   since son was born. exacerbated every winter.    Past Surgical History:  Procedure Laterality Date   CESAREAN SECTION  2011   Patient Active Problem List   Diagnosis Date Noted   Acute non-recurrent sinusitis 05/31/2021   Vitamin D deficiency 06/22/2014   Sciatica of left side 06/19/2014   Current smoker 06/19/2014    PCP: Hoy Register  REFERRING PROVIDER: Willia Craze  REFERRING DIAG: Foot drop, LBP   Rationale for Evaluation and Treatment: Rehabilitation  THERAPY DIAG:  Acute pain of left knee  Other low back pain  Chronic low back pain with left-sided sciatica, unspecified back pain laterality  Plantar fasciitis of left foot  ONSET DATE: "sometime this year for the knee but the sciatica and plantar fasciitis has been years"   SUBJECTIVE:                                                                                                                                                                                           SUBJECTIVE STATEMENT: The knee is a little sore because I went back to work today after a one week vacation.   PERTINENT HISTORY:  Hx of sciatica since 2011  PAIN:  Are you having pain? Yes: NPRS scale: 0/10 Pain location: L knee, under the knee cap and around it Pain description: sharp, shooting, dull, throbbing, pressure Aggravating factors: being on it for  too long (1-1.5hrs)  Relieving factors: nothing really,   PRECAUTIONS: None  RED FLAGS: None   WEIGHT BEARING RESTRICTIONS: No  FALLS:  Has patient fallen in last 6 months? 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   PLOF: Independent  PATIENT GOALS: lessen the pain and inflammation, get it to where I can stand for a longer period  of time without persistent pain    OBJECTIVE:   DIAGNOSTIC FINDINGS:  N/A  PATIENT SURVEYS:  FOTO 60  COGNITION: Overall cognitive status: Within functional limits for tasks assessed     SENSATION: WFL  MUSCLE LENGTH: Hamstrings: some pain and tightness   POSTURE: No Significant postural limitations  PALPATION: No TTP  LUMBAR ROM:   AROM eval  Flexion WFL   Extension End range pain  Right lateral flexion Mild tightness  Left lateral flexion WFL  Right rotation WFL  Left rotation WFL   (Blank rows = not tested)  LOWER EXTREMITY ROM:   WFL, pain with R knee extension   LOWER EXTREMITY MMT:  5/5 RLE, 4/5 L knee extension and flexion   LUMBAR SPECIAL TESTS:  Straight leg raise test: Positive  FUNCTIONAL TESTS:  5 times sit to stand: 14.81s   TODAY'S TREATMENT:                                                                                                                              DATE: 01/15/23 NuStep L5x60mins  Calf stretch 30s on slant  Calf raises 2x12 Step ups 6" Leg ext 10# 2x10, LLE 5# x5 HS curls 20# 2x10  STS 2x10  Stretching HS, glutes, piriformis 30s each side  Feet on pball rotations, knees to chest, small bridges x10   01/05/23- EVAL    PATIENT EDUCATION:  Education details: POC and HEP Person educated: Patient Education method: Explanation Education comprehension: verbalized understanding  HOME EXERCISE PROGRAM: Access Code: [email or web address] URL:  [email or web address] Date: 01/05/2023 Prepared by: Cassie Freer  Exercises - Gastroc Stretch on Wall  - 2 x daily - 7 x weekly - 30 hold - Heel Raises with Counter Support  - 1 x daily - 7 x weekly - 2 sets - 10 reps - Sit to Stand  - 1 x daily - 7 x weekly - 2 sets - 10 reps - Side Stepping with Resistance at Ankles  - 1 x daily - 7 x weekly - 2 sets - 10 reps - Supine Bridge  - 1 x daily - 7 x weekly - 2 sets - 10 reps - Supine Figure 4 Piriformis Stretch  - 2 x daily - 7 x weekly - 30 hold  ASSESSMENT:  CLINICAL IMPRESSION: Patient is a 42 y.o. female who was seen today for physical therapy evaluation and treatment for L knee pain. She also reports L sided sciatica and L foot plantar fasciitis but these are chronic issues, whereas the knee pain started earlier this year. We worked on mostly LE strengthening for the knee and then worked on some mobility and stretching. Difficulty with knee exercises on the machine but able to complete sets and reps. Was educated about possible soreness to follow. She is flexible but has some pain with stretching.   OBJECTIVE IMPAIRMENTS: decreased strength and pain.   ACTIVITY LIMITATIONS: standing and locomotion level  REHAB POTENTIAL: Good  CLINICAL DECISION MAKING: Stable/uncomplicated  EVALUATION COMPLEXITY: Low  GOALS: Goals reviewed with patient? Yes  SHORT TERM GOALS: Target date: 02/09/23  Patient will be independent with initial HEP. Goal status: INITIAL   LONG TERM GOALS: Target date: 03/16/23  Patient will be independent with advanced/ongoing HEP to improve outcomes and carryover.  Goal status: INITIAL  2.  Patient will report at least 75% improvement in L knee pain and less than 2/10 overall pain to improve QOL. Baseline: 5/10 Goal status: INITIAL  3.  Patient will increased standing tolerance to over one hour without pain. Baseline: needs break after 1 hr Goal status: INITIAL  4.  Patient will report 33 on  FOTO (patient reported outcome measure) to demonstrate improved functional ability. Baseline: 55 Goal status: INITIAL  PLAN:  PT FREQUENCY: 1-2x/week  PT DURATION: 10 weeks  PLANNED INTERVENTIONS: Therapeutic exercises, Therapeutic activity, Neuromuscular re-education, Balance training, Gait training, Patient/Family education, Self Care, Joint mobilization, Dry Needling, Electrical stimulation, Cryotherapy, Moist heat, Traction, Ionotophoresis 4mg /ml Dexamethasone, and Manual therapy.  PLAN FOR NEXT SESSION: stretching    Cassie Freer, PT 01/15/2023, 4:57 PM

## 2023-01-16 NOTE — Therapy (Signed)
OUTPATIENT PHYSICAL THERAPY THORACOLUMBAR TREATMENT   Patient Name: Crystal Koch MRN: [id number] DOB:19-Feb-1981, 42 y.o., female Today's Date: 01/17/2023  END OF SESSION:  PT End of Session - 01/17/23 1521     Visit Number 3    Date for PT Re-Evaluation 03/16/23    Authorization Type [place] Medicaid    PT Start Time 1522    PT Stop Time 1605    PT Time Calculation (min) 43 min    Activity Tolerance Patient tolerated treatment well    Behavior During Therapy WFL for tasks assessed/performed               Past Medical History:  Diagnosis Date   Asthma 1986   Sciatica 2011   since son was born. exacerbated every winter.    Past Surgical History:  Procedure Laterality Date   CESAREAN SECTION  2011   Patient Active Problem List   Diagnosis Date Noted   Acute non-recurrent sinusitis 05/31/2021   Vitamin D deficiency 06/22/2014   Sciatica of left side 06/19/2014   Current smoker 06/19/2014    PCP: Hoy Register  REFERRING PROVIDER: Willia Craze  REFERRING DIAG: Foot drop, LBP   Rationale for Evaluation and Treatment: Rehabilitation  THERAPY DIAG:  Acute pain of left knee  Other low back pain  Chronic low back pain with left-sided sciatica, unspecified back pain laterality  Plantar fasciitis of left foot  ONSET DATE: "sometime this year for the knee but the sciatica and plantar fasciitis has been years"   SUBJECTIVE:                                                                                                                                                                                           SUBJECTIVE STATEMENT: A little sore.   PERTINENT HISTORY:  Hx of sciatica since 2011  PAIN:  Are you having pain? Yes: NPRS scale: 0/10 Pain location: L knee, under the knee cap and around it Pain description: sharp, shooting, dull, throbbing, pressure Aggravating factors: being on it for too long (1-1.5hrs)  Relieving factors: nothing really,    PRECAUTIONS: None  RED FLAGS: None   WEIGHT BEARING RESTRICTIONS: No  FALLS:  Has patient fallen in last 6 months? 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   PLOF: Independent  PATIENT GOALS: lessen the pain and inflammation, get it to where I can stand for a longer period of time without persistent pain    OBJECTIVE:   DIAGNOSTIC FINDINGS:  N/A  PATIENT SURVEYS:  FOTO 55  COGNITION: Overall cognitive status: Within functional limits for tasks assessed     SENSATION: WFL  MUSCLE LENGTH: Hamstrings: some pain and tightness   POSTURE: No Significant postural limitations  PALPATION: No TTP  LUMBAR ROM:   AROM eval  Flexion WFL   Extension End range pain  Right lateral flexion Mild tightness  Left lateral flexion WFL  Right rotation WFL  Left rotation WFL   (Blank rows = not tested)  LOWER EXTREMITY ROM:   WFL, pain with R knee extension   LOWER EXTREMITY MMT:  5/5 RLE, 4/5 L knee extension and flexion   LUMBAR SPECIAL TESTS:  Straight leg raise test: Positive  FUNCTIONAL TESTS:  5 times sit to stand: 14.81s   TODAY'S TREATMENT:                                                                                                                              DATE: 01/17/23 Bike L3 x9mins Step ups 6" Lateral step ups 6" STS with OHP 2x10 Seated piriformis and HS stretch 30s each side  Resisted gait 20# 4x 4 way  Leg press 20# 2x10 Calf stretch 30s   01/15/23 NuStep L5x71mins  Calf stretch 30s on slant  Calf raises 2x12 Step ups 6" Leg ext 10# 2x10, LLE 5# x5 HS curls 20# 2x10  STS 2x10  Stretching HS, glutes, piriformis 30s each side  Feet on pball rotations, knees to chest, small bridges x10   01/05/23- EVAL    PATIENT EDUCATION:  Education details: POC and HEP Person educated: Patient Education  method: Explanation Education comprehension: verbalized understanding  HOME EXERCISE PROGRAM: Access Code: [email or web address] URL: [email or web address] Date: 01/05/2023 Prepared by: Cassie Freer  Exercises - Gastroc Stretch on Wall  - 2 x daily - 7 x weekly - 30 hold - Heel Raises with Counter Support  - 1 x daily - 7 x weekly - 2 sets - 10 reps - Sit to Stand  - 1 x daily - 7 x weekly - 2 sets - 10 reps - Side Stepping with Resistance at Ankles  - 1 x daily - 7 x weekly - 2 sets - 10 reps - Supine Bridge  - 1 x daily - 7 x weekly - 2 sets - 10 reps - Supine Figure 4 Piriformis Stretch  - 2 x daily - 7 x weekly - 30 hold  ASSESSMENT:  CLINICAL IMPRESSION: Patient is a 42 y.o. female who was seen today for physical therapy treatment for L knee pain. We worked on mostly LE strengthening for the knee today, tolerates everything well. Tightness noted in HS and piriformis.    OBJECTIVE IMPAIRMENTS: decreased strength and pain.   ACTIVITY LIMITATIONS: standing and locomotion level  REHAB POTENTIAL: Good  CLINICAL DECISION MAKING: Stable/uncomplicated  EVALUATION COMPLEXITY: Low  GOALS: Goals reviewed with patient? Yes  SHORT TERM GOALS: Target date: 02/09/23  Patient will be independent  with initial HEP. Goal status: INITIAL   LONG TERM GOALS: Target date: 03/16/23  Patient will be independent with advanced/ongoing HEP to improve outcomes and carryover.  Goal status: INITIAL  2.  Patient will report at least 75% improvement in L knee pain and less than 2/10 overall pain to improve QOL. Baseline: 5/10 Goal status: INITIAL  3.  Patient will increased standing tolerance to over one hour without pain. Baseline: needs break after 1 hr Goal status: INITIAL  4.  Patient will report 95 on FOTO (patient reported outcome measure) to demonstrate improved functional ability. Baseline: 55 Goal status: INITIAL  PLAN:  PT FREQUENCY: 1-2x/week  PT DURATION: 10  weeks  PLANNED INTERVENTIONS: Therapeutic exercises, Therapeutic activity, Neuromuscular re-education, Balance training, Gait training, Patient/Family education, Self Care, Joint mobilization, Dry Needling, Electrical stimulation, Cryotherapy, Moist heat, Traction, Ionotophoresis 4mg /ml Dexamethasone, and Manual therapy.  PLAN FOR NEXT SESSION: stretching    Cassie Freer, PT 01/17/2023, 4:05 PM

## 2023-01-17 ENCOUNTER — Ambulatory Visit: Payer: Medicaid Other

## 2023-01-17 DIAGNOSIS — M5459 Other low back pain: Secondary | ICD-10-CM

## 2023-01-17 DIAGNOSIS — M25562 Pain in left knee: Secondary | ICD-10-CM | POA: Diagnosis not present

## 2023-01-17 DIAGNOSIS — G8929 Other chronic pain: Secondary | ICD-10-CM

## 2023-01-17 DIAGNOSIS — M5442 Lumbago with sciatica, left side: Secondary | ICD-10-CM | POA: Diagnosis not present

## 2023-01-17 DIAGNOSIS — M722 Plantar fascial fibromatosis: Secondary | ICD-10-CM | POA: Diagnosis not present

## 2023-01-22 ENCOUNTER — Ambulatory Visit: Payer: Medicaid Other

## 2023-01-22 NOTE — Therapy (Signed)
OUTPATIENT PHYSICAL THERAPY THORACOLUMBAR TREATMENT   Patient Name: Crystal Koch MRN: [id number] DOB:July 22, 1980, 42 y.o., female Today's Date: 01/24/2023  END OF SESSION:  PT End of Session - 01/24/23 1609     Visit Number 4    Date for PT Re-Evaluation 03/16/23    Authorization Type [place] Medicaid    PT Start Time 1610    PT Stop Time 1655    PT Time Calculation (min) 45 min    Activity Tolerance Patient tolerated treatment well    Behavior During Therapy WFL for tasks assessed/performed                Past Medical History:  Diagnosis Date   Asthma 1986   Sciatica 2011   since son was born. exacerbated every winter.    Past Surgical History:  Procedure Laterality Date   CESAREAN SECTION  2011   Patient Active Problem List   Diagnosis Date Noted   Acute non-recurrent sinusitis 05/31/2021   Vitamin D deficiency 06/22/2014   Sciatica of left side 06/19/2014   Current smoker 06/19/2014    PCP: Hoy Register  REFERRING PROVIDER: Willia Craze  REFERRING DIAG: Foot drop, LBP   Rationale for Evaluation and Treatment: Rehabilitation  THERAPY DIAG:  Acute pain of left knee  Other low back pain  Chronic low back pain with left-sided sciatica, unspecified back pain laterality  Plantar fasciitis of left foot  ONSET DATE: "sometime this year for the knee but the sciatica and plantar fasciitis has been years"   SUBJECTIVE:                                                                                                                                                                                           SUBJECTIVE STATEMENT: The knee is okay, it just hurts when I am at work standing on the cement floor all day.  PERTINENT HISTORY:  Hx of sciatica since 2011  PAIN:  Are you having pain? Yes: NPRS scale: 0/10 Pain location: L knee, under the knee cap and around it Pain description: sharp, shooting, dull, throbbing, pressure Aggravating factors: being on  it for too long (1-1.5hrs)  Relieving factors: nothing really,   PRECAUTIONS: None  RED FLAGS: None   WEIGHT BEARING RESTRICTIONS: No  FALLS:  Has patient fallen in last 6 months? 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   PLOF: Independent  PATIENT GOALS: lessen the pain and inflammation, get it to where I can stand for a  longer period of time without persistent pain    OBJECTIVE:   DIAGNOSTIC FINDINGS:  N/A  PATIENT SURVEYS:  FOTO 10  COGNITION: Overall cognitive status: Within functional limits for tasks assessed     SENSATION: WFL  MUSCLE LENGTH: Hamstrings: some pain and tightness   POSTURE: No Significant postural limitations  PALPATION: No TTP  LUMBAR ROM:   AROM eval  Flexion WFL   Extension End range pain  Right lateral flexion Mild tightness  Left lateral flexion WFL  Right rotation WFL  Left rotation WFL   (Blank rows = not tested)  LOWER EXTREMITY ROM:   WFL, pain with R knee extension   LOWER EXTREMITY MMT:  5/5 RLE, 4/5 L knee extension and flexion   LUMBAR SPECIAL TESTS:  Straight leg raise test: Positive  FUNCTIONAL TESTS:  5 times sit to stand: 14.81s   TODAY'S TREATMENT:                                                                                                                              DATE: 01/24/23 NuStep L5 x80mins  Leg ext 10# 2x10 HS curls 20# 2x10 Calf raises 2x12 Calf stretch 30s  Leg press 20# 2x10 Stretching HS, glutes, piriformis 30s each side  Feet on pball rotations, knees to chest, small bridges x10 STS with chest press yellow 2x10   01/17/23 Bike L3 x72mins Step ups 6" Lateral step ups 6" STS with OHP 2x10 Seated piriformis and HS stretch 30s each side  Resisted gait 20# 4x 4 way  Leg press 20# 2x10 Calf stretch 30s   01/15/23 NuStep L5x24mins  Calf stretch  30s on slant  Calf raises 2x12 Step ups 6" Leg ext 10# 2x10, LLE 5# x5 HS curls 20# 2x10  STS 2x10  Stretching HS, glutes, piriformis 30s each side  Feet on pball rotations, knees to chest, small bridges x10   01/05/23- EVAL    PATIENT EDUCATION:  Education details: POC and HEP Person educated: Patient Education method: Explanation Education comprehension: verbalized understanding  HOME EXERCISE PROGRAM: Access Code: [email or web address] URL: [email or web address] Date: 01/05/2023 Prepared by: Cassie Freer  Exercises - Gastroc Stretch on Wall  - 2 x daily - 7 x weekly - 30 hold - Heel Raises with Counter Support  - 1 x daily - 7 x weekly - 2 sets - 10 reps - Sit to Stand  - 1 x daily - 7 x weekly - 2 sets - 10 reps - Side Stepping with Resistance at Ankles  - 1 x daily - 7 x weekly - 2 sets - 10 reps - Supine Bridge  - 1 x daily - 7 x weekly - 2 sets - 10 reps - Supine Figure 4 Piriformis Stretch  - 2 x daily - 7 x weekly - 30 hold  ASSESSMENT:  CLINICAL IMPRESSION: Patient is a 42 y.o. female who was seen today for physical therapy treatment for L knee pain. We worked  on mostly LE strengthening for the knee today, tolerates everything well. Also added in some core and low back exercises.    OBJECTIVE IMPAIRMENTS: decreased strength and pain.   ACTIVITY LIMITATIONS: standing and locomotion level  REHAB POTENTIAL: Good  CLINICAL DECISION MAKING: Stable/uncomplicated  EVALUATION COMPLEXITY: Low  GOALS: Goals reviewed with patient? Yes  SHORT TERM GOALS: Target date: 02/09/23  Patient will be independent with initial HEP. Goal status: INITIAL   LONG TERM GOALS: Target date: 03/16/23  Patient will be independent with advanced/ongoing HEP to improve outcomes and carryover.  Goal status: INITIAL  2.  Patient will report at least 75% improvement in L knee pain and less than 2/10 overall pain to improve QOL. Baseline: 5/10 Goal status: INITIAL  3.  Patient  will increased standing tolerance to over one hour without pain. Baseline: needs break after 1 hr Goal status: INITIAL  4.  Patient will report 38 on FOTO (patient reported outcome measure) to demonstrate improved functional ability. Baseline: 55 Goal status: INITIAL  PLAN:  PT FREQUENCY: 1-2x/week  PT DURATION: 10 weeks  PLANNED INTERVENTIONS: Therapeutic exercises, Therapeutic activity, Neuromuscular re-education, Balance training, Gait training, Patient/Family education, Self Care, Joint mobilization, Dry Needling, Electrical stimulation, Cryotherapy, Moist heat, Traction, Ionotophoresis 4mg /ml Dexamethasone, and Manual therapy.  PLAN FOR NEXT SESSION: stretching    Cassie Freer, PT 01/24/2023, 4:52 PM

## 2023-01-23 ENCOUNTER — Encounter (HOSPITAL_COMMUNITY): Payer: Self-pay

## 2023-01-24 ENCOUNTER — Ambulatory Visit: Payer: Medicaid Other

## 2023-01-24 DIAGNOSIS — M5459 Other low back pain: Secondary | ICD-10-CM | POA: Diagnosis not present

## 2023-01-24 DIAGNOSIS — M25562 Pain in left knee: Secondary | ICD-10-CM | POA: Diagnosis not present

## 2023-01-24 DIAGNOSIS — M722 Plantar fascial fibromatosis: Secondary | ICD-10-CM | POA: Diagnosis not present

## 2023-01-24 DIAGNOSIS — M5442 Lumbago with sciatica, left side: Secondary | ICD-10-CM | POA: Diagnosis not present

## 2023-01-24 DIAGNOSIS — G8929 Other chronic pain: Secondary | ICD-10-CM | POA: Diagnosis not present

## 2023-01-29 ENCOUNTER — Ambulatory Visit: Payer: Medicaid Other

## 2023-01-31 ENCOUNTER — Ambulatory Visit: Payer: Medicaid Other

## 2023-02-05 ENCOUNTER — Ambulatory Visit: Payer: Medicaid Other

## 2023-02-06 DIAGNOSIS — M5451 Vertebrogenic low back pain: Secondary | ICD-10-CM | POA: Diagnosis not present

## 2023-02-07 ENCOUNTER — Ambulatory Visit: Payer: Medicaid Other

## 2023-02-18 ENCOUNTER — Other Ambulatory Visit: Payer: Self-pay | Admitting: Podiatry

## 2023-02-19 ENCOUNTER — Telehealth: Payer: Medicaid Other | Admitting: Physician Assistant

## 2023-02-19 DIAGNOSIS — B379 Candidiasis, unspecified: Secondary | ICD-10-CM | POA: Diagnosis not present

## 2023-02-19 DIAGNOSIS — T3695XA Adverse effect of unspecified systemic antibiotic, initial encounter: Secondary | ICD-10-CM | POA: Diagnosis not present

## 2023-02-19 MED ORDER — FLUCONAZOLE 150 MG PO TABS
150.0000 mg | ORAL_TABLET | ORAL | 0 refills | Status: DC | PRN
Start: 2023-02-19 — End: 2023-04-06

## 2023-02-19 NOTE — Progress Notes (Signed)
Virtual Visit Consent   Crystal Koch, you are scheduled for a virtual visit with a [facility] provider today. Just as with appointments in the office, your consent must be obtained to participate. Your consent will be active for this visit and any virtual visit you may have with one of our providers in the next 365 days. If you have a MyChart account, a copy of this consent can be sent to you electronically.  As this is a virtual visit, video technology does not allow for your provider to perform a traditional examination. This may limit your provider's ability to fully assess your condition. If your provider identifies any concerns that need to be evaluated in person or the need to arrange testing (such as labs, EKG, etc.), we will make arrangements to do so. Although advances in technology are sophisticated, we cannot ensure that it will always work on either your end or our end. If the connection with a video visit is poor, the visit may have to be switched to a telephone visit. With either a video or telephone visit, we are not always able to ensure that we have a secure connection.  By engaging in this virtual visit, you consent to the provision of healthcare and authorize for your insurance to be billed (if applicable) for the services provided during this visit. Depending on your insurance coverage, you may receive a charge related to this service.  I need to obtain your verbal consent now. Are you willing to proceed with your visit today? Crystal Koch has provided verbal consent on 02/19/2023 for a virtual visit (video or telephone). [name], PA-C  Date: 02/19/2023 8:41 AM  Virtual Visit via Video Note   I, [name], connected with  Crystal Koch  ([id number], Oct 27, 1980) on 02/19/23 at  8:45 AM EDT by a video-enabled telemedicine application and verified that I am speaking with the correct person using two identifiers.  Location: Patient: Virtual Visit Location Patient:  Home Provider: Virtual Visit Location Provider: Home Office   I discussed the limitations of evaluation and management by telemedicine and the availability of in person appointments. The patient expressed understanding and agreed to proceed.    History of Present Illness: Crystal Koch is a 42 y.o. who identifies as a female who was assigned female at birth, and is being seen today for vaginal irritation.  HPI: Vaginal Itching The patient's primary symptoms include genital itching and vaginal discharge. The patient's pertinent negatives include no genital lesions, genital odor (had initially but improving with metrogel treatment), genital rash, missed menses, pelvic pain or vaginal bleeding. This is a recurrent problem. The current episode started in the past 7 days. The problem occurs constantly. The problem has been gradually worsening. The patient is experiencing no pain. She is not pregnant. Pertinent negatives include no abdominal pain, back pain, chills, dysuria, fever, flank pain, frequency or nausea. The vaginal discharge was thick and white. There has been no bleeding. She has not been passing clots. She has not been passing tissue. The symptoms are aggravated by tactile pressure. Treatments tried: on metrogel for BV. The treatment provided no relief.     Problems:  Patient Active Problem List   Diagnosis Date Noted   Acute non-recurrent sinusitis 05/31/2021   Vitamin D deficiency 06/22/2014   Sciatica of left side 06/19/2014   Current smoker 06/19/2014    Allergies: No Known Allergies Medications:  Current Outpatient Medications:    fluconazole (DIFLUCAN) 150 MG tablet, Take 1 tablet (  150 mg total) by mouth every 3 (three) days as needed., Disp: 2 tablet, Rfl: 0   gabapentin (NEURONTIN) 100 MG capsule, TAKE 1 CAPSULE(100 MG) BY MOUTH THREE TIMES DAILY, Disp: 90 capsule, Rfl: 0   meloxicam (MOBIC) 15 MG tablet, Take 1 tablet (15 mg total) by mouth daily., Disp: 30 tablet, Rfl: 3    methocarbamol (ROBAXIN) 500 MG tablet, Take 1 tablet (500 mg total) by mouth every 8 (eight) hours as needed for muscle spasms., Disp: 60 tablet, Rfl: 3   omeprazole (PRILOSEC) 40 MG capsule, Take 1 capsule (40 mg total) by mouth daily., Disp: 90 capsule, Rfl: 1  Observations/Objective: Patient is well-developed, well-nourished in no acute distress.  Resting comfortably at home.  Head is normocephalic, atraumatic.  No labored breathing.  Speech is clear and coherent with logical content.  Patient is alert and oriented at baseline.    Assessment and Plan: 1. Antibiotic-induced yeast infection - fluconazole (DIFLUCAN) 150 MG tablet; Take 1 tablet (150 mg total) by mouth every 3 (three) days as needed.  Dispense: 2 tablet; Refill: 0  - Symptoms consistent with yeast vaginitis - Fluconazole prescribed - Limit bubble baths, scented lotions/soaps/detergents - Limit tight fitting clothing - Seek on person evaluation if not improving or if symptoms worsen   Follow Up Instructions: I discussed the assessment and treatment plan with the patient. The patient was provided an opportunity to ask questions and all were answered. The patient agreed with the plan and demonstrated an understanding of the instructions.  A copy of instructions were sent to the patient via MyChart unless otherwise noted below.    The patient was advised to call back or seek an in-person evaluation if the symptoms worsen or if the condition fails to improve as anticipated.  Time:  I spent 8 minutes with the patient via telehealth technology discussing the above problems/concerns.    [name], PA-C

## 2023-02-19 NOTE — Patient Instructions (Signed)
[name], thank you for joining [name], PA-C for today's virtual visit.  While this provider is not your primary care provider (PCP), if your PCP is located in our provider database this encounter information will be shared with them immediately following your visit.   A [facility] MyChart account gives you access to today's visit and all your visits, tests, and labs performed at [facility] " click here if you don't have a [facility] MyChart account or go to [email or web address]  Consent: (Patient) Jacilyn Haar provided verbal consent for this virtual visit at the beginning of the encounter.  Current Medications:  Current Outpatient Medications:    fluconazole (DIFLUCAN) 150 MG tablet, Take 1 tablet (150 mg total) by mouth every 3 (three) days as needed., Disp: 2 tablet, Rfl: 0   gabapentin (NEURONTIN) 100 MG capsule, TAKE 1 CAPSULE(100 MG) BY MOUTH THREE TIMES DAILY, Disp: 90 capsule, Rfl: 0   meloxicam (MOBIC) 15 MG tablet, Take 1 tablet (15 mg total) by mouth daily., Disp: 30 tablet, Rfl: 3   methocarbamol (ROBAXIN) 500 MG tablet, Take 1 tablet (500 mg total) by mouth every 8 (eight) hours as needed for muscle spasms., Disp: 60 tablet, Rfl: 3   omeprazole (PRILOSEC) 40 MG capsule, Take 1 capsule (40 mg total) by mouth daily., Disp: 90 capsule, Rfl: 1   Medications ordered in this encounter:  Meds ordered this encounter  Medications   fluconazole (DIFLUCAN) 150 MG tablet    Sig: Take 1 tablet (150 mg total) by mouth every 3 (three) days as needed.    Dispense:  2 tablet    Refill:  0    Order Specific Question:   Supervising Provider    Answer:   Merrilee Jansky [id number]     *If you need refills on other medications prior to your next appointment, please contact your pharmacy*  Follow-Up: Call back or seek an in-person evaluation if the symptoms worsen or if the condition fails to improve as anticipated.  [facility] [phone]  Other Instructions Vaginal Yeast Infection, Adult  Vaginal yeast infection is a condition that causes vaginal discharge as well as soreness, swelling, and redness (inflammation) of the vagina. This is a common condition. Some women get this infection frequently. What are the causes? This condition is caused by a change in the normal balance of the yeast (Candida) and normal bacteria that live in the vagina. This change causes an overgrowth of yeast, which causes the inflammation. What increases the risk? The condition is more likely to develop in women who: Take antibiotic medicines. Have diabetes. Take birth control pills. Are pregnant. Douche often. Have a weak body defense system (immune system). Have been taking steroid medicines for a long time. Frequently wear tight clothing. What are the signs or symptoms? Symptoms of this condition include: White, thick, creamy vaginal discharge. Swelling, itching, redness, and irritation of the vagina. The lips of the vagina (labia) may be affected as well. Pain or a burning feeling while urinating. Pain during sex. How is this diagnosed? This condition is diagnosed based on: Your medical history. A physical exam. A pelvic exam. Your health care provider will examine a sample of your vaginal discharge under a microscope. Your health care provider may send this sample for testing to confirm the diagnosis. How is this treated? This condition is treated with medicine. Medicines may be over-the-counter or prescription. You may be told to use one or more of the following: Medicine  that is taken by mouth (orally). Medicine that is applied as a cream (topically). Medicine that is inserted directly into the vagina (suppository). Follow these instructions at home: Take or apply over-the-counter and prescription medicines only as told by your health care provider. Do not use tampons until your health care provider approves. Do not have  sex until your infection has cleared. Sex can prolong or worsen your symptoms of infection. Ask your health care provider when it is safe to resume sexual activity. Keep all follow-up visits. This is important. How is this prevented?  Do not wear tight clothes, such as pantyhose or tight pants. Wear breathable cotton underwear. Do not use douches, perfumed soap, creams, or powders. Wipe from front to back after using the toilet. If you have diabetes, keep your blood sugar levels under control. Ask your health care provider for other ways to prevent yeast infections. Contact a health care provider if: You have a fever. Your symptoms go away and then return. Your symptoms do not get better with treatment. Your symptoms get worse. You have new symptoms. You develop blisters in or around your vagina. You have blood coming from your vagina and it is not your menstrual period. You develop pain in your abdomen. Summary Vaginal yeast infection is a condition that causes discharge as well as soreness, swelling, and redness (inflammation) of the vagina. This condition is treated with medicine. Medicines may be over-the-counter or prescription. Take or apply over-the-counter and prescription medicines only as told by your health care provider. Do not douche. Resume sexual activity or use of tampons as instructed by your health care provider. Contact a health care provider if your symptoms do not get better with treatment or your symptoms go away and then return. This information is not intended to replace advice given to you by your health care provider. Make sure you discuss any questions you have with your health care provider. Document Revised: 08/16/2020 Document Reviewed: 08/16/2020 Elsevier Patient Education  2024 Elsevier Inc.    If you have been instructed to have an in-person evaluation today at a local Urgent Care facility, please use the link below. It will take you to a list of all  of our available [facility], including address, phone number and hours of operation. Please do not delay care.  [facility]  If you or a family member do not have a primary care provider, use the link below to schedule a visit and establish care. When you choose a [facility] primary care physician or advanced practice provider, you gain a long-term partner in health. Find a Primary Care Provider  Learn more about [facility]'s in-office and virtual care options: [facility] - Get Care Now

## 2023-02-22 ENCOUNTER — Telehealth: Payer: Medicaid Other | Admitting: Physician Assistant

## 2023-02-22 DIAGNOSIS — R52 Pain, unspecified: Secondary | ICD-10-CM

## 2023-02-22 MED ORDER — METHOCARBAMOL 500 MG PO TABS
500.0000 mg | ORAL_TABLET | Freq: Three times a day (TID) | ORAL | 0 refills | Status: DC | PRN
Start: 2023-02-22 — End: 2023-02-24

## 2023-02-22 NOTE — Progress Notes (Signed)
Virtual Visit Consent   [name], you are scheduled for a virtual visit with a  provider today. Just as with appointments in the office, your consent must be obtained to participate. Your consent will be active for this visit and any virtual visit you may have with one of our providers in the next 365 days. If you have a MyChart account, a copy of this consent can be sent to you electronically.  As this is a virtual visit, video technology does not allow for your provider to perform a traditional examination. This may limit your provider's ability to fully assess your condition. If your provider identifies any concerns that need to be evaluated in person or the need to arrange testing (such as labs, EKG, etc.), we will make arrangements to do so. Although advances in technology are sophisticated, we cannot ensure that it will always work on either your end or our end. If the connection with a video visit is poor, the visit may have to be switched to a telephone visit. With either a video or telephone visit, we are not always able to ensure that we have a secure connection.  By engaging in this virtual visit, you consent to the provision of healthcare and authorize for your insurance to be billed (if applicable) for the services provided during this visit. Depending on your insurance coverage, you may receive a charge related to this service.  I need to obtain your verbal consent now. Are you willing to proceed with your visit today? Crystal Koch has provided verbal consent on 02/22/2023 for a virtual visit (video or telephone). [name], PA-C  Date: 02/22/2023 7:37 AM  Virtual Visit via Video Note   I, [name], connected with  Crystal Koch  ([id number], 1980-09-26) on 02/22/23 at  7:45 AM EDT by a video-enabled telemedicine application and verified that I am speaking with the correct person using two identifiers.  Location: Patient: Virtual Visit Location Patient:  Home Provider: Virtual Visit Location Provider: Home Office   I discussed the limitations of evaluation and management by telemedicine and the availability of in person appointments. The patient expressed understanding and agreed to proceed.    History of Present Illness: Crystal Koch is a 42 y.o. who identifies as a female who was assigned female at birth, and is being seen today for body aches.   HPI: Muscle Pain This is a recurrent problem. The current episode started yesterday. The problem occurs constantly. The context of the pain is unknown. The pain is present in the left upper leg, right upper leg, right knee, right lower leg, left lower leg and left knee. The pain is severe. The symptoms are aggravated by any movement. Associated symptoms include fatigue, headaches and weakness. Pertinent negatives include no constipation, diarrhea, dysuria, fever, nausea, stiffness, sensory change, swollen glands, urinary symptoms, vaginal discharge, visual change or vomiting. (Scratchy throat) Treatments tried: Methocarbamol, meloxicam, tylenol. The treatment provided mild relief. There is no swelling present. She has been Behaving normally. Her past medical history is significant for chronic back pain.     Problems:  Patient Active Problem List   Diagnosis Date Noted   Acute non-recurrent sinusitis 05/31/2021   Vitamin D deficiency 06/22/2014   Sciatica of left side 06/19/2014   Current smoker 06/19/2014    Allergies: No Known Allergies Medications:  Current Outpatient Medications:    fluconazole (DIFLUCAN) 150 MG tablet, Take 1 tablet (150 mg total) by mouth every 3 (three) days as needed.,  Disp: 2 tablet, Rfl: 0   gabapentin (NEURONTIN) 100 MG capsule, TAKE 1 CAPSULE(100 MG) BY MOUTH THREE TIMES DAILY, Disp: 90 capsule, Rfl: 0   meloxicam (MOBIC) 15 MG tablet, TAKE 1 TABLET BY MOUTH EVERY DAY, Disp: 30 tablet, Rfl: 3   methocarbamol (ROBAXIN) 500 MG tablet, Take 1 tablet (500 mg total) by  mouth every 8 (eight) hours as needed for muscle spasms., Disp: 60 tablet, Rfl: 3   omeprazole (PRILOSEC) 40 MG capsule, Take 1 capsule (40 mg total) by mouth daily., Disp: 90 capsule, Rfl: 1  Observations/Objective: Patient is well-developed, well-nourished in no acute distress.  Resting comfortably at home.  Head is normocephalic, atraumatic.  No labored breathing.  Speech is clear and coherent with logical content.  Patient is alert and oriented at baseline.    Assessment and Plan: There are no diagnoses linked to this encounter. - Suspect viral etiology - Methocarbamol refilled - Continue Meloxicam as needed - Can add tylenol as needed - Push fluids - Discussed taking Covid 19 test - Work note provided - Follow up in person if no improvements or if symptoms worsen  Follow Up Instructions: I discussed the assessment and treatment plan with the patient. The patient was provided an opportunity to ask questions and all were answered. The patient agreed with the plan and demonstrated an understanding of the instructions.  A copy of instructions were sent to the patient via MyChart unless otherwise noted below.    The patient was advised to call back or seek an in-person evaluation if the symptoms worsen or if the condition fails to improve as anticipated.  Time:  I spent 10 minutes with the patient via telehealth technology discussing the above problems/concerns.    [name], PA-C

## 2023-02-22 NOTE — Patient Instructions (Signed)
Crystal Koch, thank you for joining [name], PA-C for today's virtual visit.  While this provider is not your primary care provider (PCP), if your PCP is located in our provider database this encounter information will be shared with them immediately following your visit.   A [facility] MyChart account gives you access to today's visit and all your visits, tests, and labs performed at [facility] " click here if you don't have a [name] MyChart account or go to [email or web address]  Consent: (Patient) Crystal Koch provided verbal consent for this virtual visit at the beginning of the encounter.  Current Medications:  Current Outpatient Medications:    fluconazole (DIFLUCAN) 150 MG tablet, Take 1 tablet (150 mg total) by mouth every 3 (three) days as needed., Disp: 2 tablet, Rfl: 0   gabapentin (NEURONTIN) 100 MG capsule, TAKE 1 CAPSULE(100 MG) BY MOUTH THREE TIMES DAILY, Disp: 90 capsule, Rfl: 0   meloxicam (MOBIC) 15 MG tablet, TAKE 1 TABLET BY MOUTH EVERY DAY, Disp: 30 tablet, Rfl: 3   methocarbamol (ROBAXIN) 500 MG tablet, Take 1 tablet (500 mg total) by mouth every 8 (eight) hours as needed for muscle spasms., Disp: 60 tablet, Rfl: 0   omeprazole (PRILOSEC) 40 MG capsule, Take 1 capsule (40 mg total) by mouth daily., Disp: 90 capsule, Rfl: 1   Medications ordered in this encounter:  Meds ordered this encounter  Medications   methocarbamol (ROBAXIN) 500 MG tablet    Sig: Take 1 tablet (500 mg total) by mouth every 8 (eight) hours as needed for muscle spasms.    Dispense:  60 tablet    Refill:  0    Order Specific Question:   Supervising Provider    Answer:   Merrilee Jansky [id number]     *If you need refills on other medications prior to your next appointment, please contact your pharmacy*  Follow-Up: Call back or seek an in-person evaluation if the symptoms worsen or if the condition fails to improve as anticipated.  [facility]  [phone]  Other Instructions  Musculoskeletal Pain Musculoskeletal pain refers to aches and pains in your bones, joints, muscles, and the tissues that surround them. This pain can occur in any part of the body. It can last for a short time (acute) or a long time (chronic). A physical exam, lab tests, and imaging studies may be done to find the cause of your musculoskeletal pain. Follow these instructions at home: Lifestyle Try to control or lower your stress levels. Stress increases muscle tension and can worsen musculoskeletal pain. It is important to recognize when you are anxious or stressed and learn ways to manage it. This may include: Meditation or yoga. Cognitive or behavioral therapy. Acupuncture or massage therapy. You may continue all activities unless the activities cause more pain. When the pain gets better, slowly resume your normal activities. Gradually increase the intensity and duration of your activities or exercise. Managing pain, stiffness, and swelling     Treatment may include medicines for pain and inflammation that are taken by mouth or applied to the skin. Take over-the-counter and prescription medicines only as told by your health care provider. When your pain is severe, bed rest may be helpful. Lie or sit in any position that is comfortable, but get out of bed and walk around at least every couple of hours. If directed, apply heat to the affected area as often as told by your health care provider. Use the heat source that  your health care provider recommends, such as a moist heat pack or a heating pad. Place a towel between your skin and the heat source. Leave the heat on for 20-30 minutes. Remove the heat if your skin turns bright red. This is especially important if you are unable to feel pain, heat, or cold. You may have a greater risk of getting burned. If directed, put ice on the painful area. To do this: Put ice in a plastic bag. Place a towel between  your skin and the bag. Leave the ice on for 20 minutes, 2-3 times a day. Remove the ice if your skin turns bright red. This is very important. If you cannot feel pain, heat, or cold, you have a greater risk of damage to the area. General instructions Your health care provider may recommend that you see a physical therapist. This person can help you come up with a safe exercise program. If told by your health care provider, do physical therapy exercises to improve movement and strength in the affected area. Keep all follow-up visits. This is important. This includes any physical therapy visits. Contact a health care provider if: Your pain gets worse. Medicines do not help ease your pain. You cannot use the part of your body that hurts, such as your arm, leg, or neck. You have trouble sleeping. You have trouble doing your normal activities. Get help right away if: You have a new injury and your pain is worse or different. You feel numb or you have tingling in the painful area. Summary Musculoskeletal pain refers to aches and pains in your bones, joints, muscles, and the tissues that surround them. This pain can occur in any part of the body. Your health care provider may recommend that you see a physical therapist. This person can help you come up with a safe exercise program. Do any exercises as told by your physical therapist. Lower your stress level. Stress can worsen musculoskeletal pain. Ways to lower stress may include meditation, yoga, cognitive or behavioral therapy, acupuncture, and massage therapy. This information is not intended to replace advice given to you by your health care provider. Make sure you discuss any questions you have with your health care provider. Document Revised: 10/02/2019 Document Reviewed: 09/10/2019 Elsevier Patient Education  2024 Elsevier Inc.    If you have been instructed to have an in-person evaluation today at a local Urgent Care facility, please use  the link below. It will take you to a list of all of our available [facility], including address, phone number and hours of operation. Please do not delay care.  [facility]  If you or a family member do not have a primary care provider, use the link below to schedule a visit and establish care. When you choose a [facility] primary care physician or advanced practice provider, you gain a long-term partner in health. Find a Primary Care Provider  Learn more about [facility]'s in-office and virtual care options: [facility] Get Care Now

## 2023-02-24 ENCOUNTER — Ambulatory Visit
Admission: RE | Admit: 2023-02-24 | Discharge: 2023-02-24 | Disposition: A | Discharge status: Home / Self Care | Payer: Medicaid Other | Source: Ambulatory Visit | Attending: Internal Medicine

## 2023-02-24 VITALS — BP 119/79 | HR 94 | Temp 98.7°F | Resp 18

## 2023-02-24 DIAGNOSIS — M5416 Radiculopathy, lumbar region: Secondary | ICD-10-CM | POA: Diagnosis not present

## 2023-02-24 DIAGNOSIS — M5441 Lumbago with sciatica, right side: Secondary | ICD-10-CM

## 2023-02-24 DIAGNOSIS — M62830 Muscle spasm of back: Secondary | ICD-10-CM

## 2023-02-24 MED ORDER — PREDNISONE 20 MG PO TABS: ORAL_TABLET | ORAL | 0 refills | Status: DC

## 2023-02-24 MED ORDER — CYCLOBENZAPRINE HCL 5 MG PO TABS: 5.0000 mg | ORAL_TABLET | Freq: Three times a day (TID) | ORAL | 0 refills | Status: DC | PRN

## 2023-02-24 NOTE — ED Provider Notes (Signed)
[facility]  Note:  This document was prepared using Conservation officer, historic buildings and may include unintentional dictation errors.  MRN: [id number] DOB: 1980-09-05  Subjective:   Crystal Koch is a 42 y.o. female with past medical history of lumbar radiculopathy presenting for 1 month history of persistent right lower back pain, tightness and spasms.  Has longstanding history of sciatica.  Has typically affected the left side but is now affecting the right side.  Patient has been managed by Dr. Alvester Morin, last had an epidural steroid injection using Depo-Medrol 80 mg 12/21/2022.  She subsequently went to a different back surgeon with Dr. Shon Baton last month.  Has not followed up with them.  Has had multiple x-rays done in the past 3 months.  Has not had an MRI done recently.  No fall, trauma.  No history of scoliosis that she knows of.  No current facility-administered medications for this encounter.  Current Outpatient Medications:    fluconazole (DIFLUCAN) 150 MG tablet, Take 1 tablet (150 mg total) by mouth every 3 (three) days as needed., Disp: 2 tablet, Rfl: 0   gabapentin (NEURONTIN) 100 MG capsule, TAKE 1 CAPSULE(100 MG) BY MOUTH THREE TIMES DAILY, Disp: 90 capsule, Rfl: 0   meloxicam (MOBIC) 15 MG tablet, TAKE 1 TABLET BY MOUTH EVERY DAY, Disp: 30 tablet, Rfl: 3   methocarbamol (ROBAXIN) 500 MG tablet, Take 1 tablet (500 mg total) by mouth every 8 (eight) hours as needed for muscle spasms., Disp: 60 tablet, Rfl: 0   omeprazole (PRILOSEC) 40 MG capsule, Take 1 capsule (40 mg total) by mouth daily., Disp: 90 capsule, Rfl: 1   No Known Allergies  Past Medical History:  Diagnosis Date   Asthma 1986   Sciatica 2011   since son was born. exacerbated every winter.      Past Surgical History:  Procedure Laterality Date   CESAREAN SECTION  2011    Family History  Problem Relation Age of Onset   Cancer Maternal Grandmother    Cancer Father    Thyroid disease  Mother    Hypertension Maternal Uncle    Diabetes Neg Hx     Social History   Tobacco Use   Smoking status: Every Day    Current packs/day: 0.50    Average packs/day: 0.5 packs/day for 16.0 years (8.0 ttl pk-yrs)    Types: Cigarettes   Smokeless tobacco: Never  Vaping Use   Vaping status: Never Used  Substance Use Topics   Alcohol use: No   Drug use: Yes    Types: Marijuana    ROS   Objective:   Vitals: BP 119/79 (BP Location: Right Arm)   Pulse 94   Temp 98.7 F (37.1 C) (Oral)   Resp 18   SpO2 96%   Physical Exam Constitutional:      General: She is not in acute distress.    Appearance: Normal appearance. She is well-developed. She is not ill-appearing, toxic-appearing or diaphoretic.  HENT:     Head: Normocephalic and atraumatic.     Nose: Nose normal.     Mouth/Throat:     Mouth: Mucous membranes are moist.  Eyes:     General: No scleral icterus.       Right eye: No discharge.        Left eye: No discharge.     Extraocular Movements: Extraocular movements intact.  Cardiovascular:     Rate and Rhythm: Normal rate.  Pulmonary:     Effort:  Pulmonary effort is normal.  Musculoskeletal:     Lumbar back: Spasms (over area outlined) and tenderness (over area outlined) present. No swelling, edema, deformity, signs of trauma, lacerations or bony tenderness. Normal range of motion. Positive right straight leg raise test. Negative left straight leg raise test. No scoliosis.       Back:  Skin:    General: Skin is warm and dry.  Neurological:     General: No focal deficit present.     Mental Status: She is alert and oriented to person, place, and time.  Psychiatric:        Mood and Affect: Mood normal.        Behavior: Behavior normal.     Assessment and Plan :   PDMP not reviewed this encounter.  1. Acute back pain with sciatica, right   2. Spasm of muscle of lower back   3. Lumbar radiculopathy, acute    Deferred imaging, recommended a oral  prednisone course, change the muscle relaxant to cyclobenzaprine instead of methocarbamol.  Hold meloxicam while on prednisone.  Follow-up with the spine specialist for consideration of a repeat MRI and general recheck on her back progress.  Counseled patient on potential for adverse effects with medications prescribed/recommended today, ER and return-to-clinic precautions discussed, patient verbalized understanding.    Wallis Bamberg, PA-C 02/24/23 1414

## 2023-02-24 NOTE — ED Triage Notes (Addendum)
Pt c/o right lower back pain x 1 month-denies injury-seen by PCP-stopped PT due to no improvement-taking gabapentin, meloxicam, tylenol, robaxin-NAD-steady gait

## 2023-02-24 NOTE — Discharge Instructions (Addendum)
Hold meloxicam while on prednisone. Use cyclobenzaprine as a muscle relaxant. If it makes you sleepy then just take it at bedtime. Hydrate with 80 ounces of water daily (that's 5 bottles of the 16 ounce). Follow up with your back specialist, Dr. Alvester Morin as soon as possible.

## 2023-04-02 DIAGNOSIS — H524 Presbyopia: Secondary | ICD-10-CM | POA: Diagnosis not present

## 2023-04-06 ENCOUNTER — Telehealth: Payer: Medicaid Other | Admitting: Physician Assistant

## 2023-04-06 DIAGNOSIS — H5213 Myopia, bilateral: Secondary | ICD-10-CM | POA: Diagnosis not present

## 2023-04-06 DIAGNOSIS — B3731 Acute candidiasis of vulva and vagina: Secondary | ICD-10-CM | POA: Diagnosis not present

## 2023-04-06 MED ORDER — FLUCONAZOLE 150 MG PO TABS
150.0000 mg | ORAL_TABLET | ORAL | 0 refills | Status: DC | PRN
Start: 2023-04-06 — End: 2023-04-18

## 2023-04-06 NOTE — Patient Instructions (Signed)
  [name], thank you for joining [name], PA-C for today's virtual visit.  While this provider is not your primary care provider (PCP), if your PCP is located in our provider database this encounter information will be shared with them immediately following your visit.   A [facility] MyChart account gives you access to today's visit and all your visits, tests, and labs performed at [facility] " click here if you don't have a [facility] MyChart account or go to [email or web address]  Consent: (Patient) Crystal Koch provided verbal consent for this virtual visit at the beginning of the encounter.  Current Medications:  Current Outpatient Medications:    fluconazole (DIFLUCAN) 150 MG tablet, Take 1 tablet (150 mg total) by mouth every 3 (three) days as needed., Disp: 2 tablet, Rfl: 0   cyclobenzaprine (FLEXERIL) 5 MG tablet, Take 1 tablet (5 mg total) by mouth 3 (three) times daily as needed for muscle spasms., Disp: 30 tablet, Rfl: 0   gabapentin (NEURONTIN) 100 MG capsule, TAKE 1 CAPSULE(100 MG) BY MOUTH THREE TIMES DAILY, Disp: 90 capsule, Rfl: 0   meloxicam (MOBIC) 15 MG tablet, TAKE 1 TABLET BY MOUTH EVERY DAY, Disp: 30 tablet, Rfl: 3   omeprazole (PRILOSEC) 40 MG capsule, Take 1 capsule (40 mg total) by mouth daily., Disp: 90 capsule, Rfl: 1   predniSONE (DELTASONE) 20 MG tablet, Take 2 tablets daily with breakfast., Disp: 10 tablet, Rfl: 0   Medications ordered in this encounter:  Meds ordered this encounter  Medications   fluconazole (DIFLUCAN) 150 MG tablet    Sig: Take 1 tablet (150 mg total) by mouth every 3 (three) days as needed.    Dispense:  2 tablet    Refill:  0    Order Specific Question:   Supervising Provider    Answer:   Merrilee Jansky [id number]     *If you need refills on other medications prior to your next appointment, please contact your pharmacy*  Follow-Up: Call back or seek an in-person evaluation if the symptoms worsen or  if the condition fails to improve as anticipated.  [facility] [phone]  Other Instructions Vaginal Probiotics: AZO vaginal probiotic OLLY Happy Hoo-Ha RAW Vaginal Care RenewLife Women's vaginal probiotic RepHresh Pro-B  Vaginal washes: Honey Pot Summer's Eve Vagisil Feminine cleanser    If you have been instructed to have an in-person evaluation today at a local Urgent Care facility, please use the link below. It will take you to a list of all of our available [facility], including address, phone number and hours of operation. Please do not delay care.  [facility]  If you or a family member do not have a primary care provider, use the link below to schedule a visit and establish care. When you choose a [facility] primary care physician or advanced practice provider, you gain a long-term partner in health. Find a Primary Care Provider  Learn more about [facility]'s in-office and virtual care options: [facility] - Get Care Now

## 2023-04-06 NOTE — Progress Notes (Signed)
Virtual Visit Consent   Nickcole Blasberg, you are scheduled for a virtual visit with a [facility] provider today. Just as with appointments in the office, your consent must be obtained to participate. Your consent will be active for this visit and any virtual visit you may have with one of our providers in the next 365 days. If you have a MyChart account, a copy of this consent can be sent to you electronically.  As this is a virtual visit, video technology does not allow for your provider to perform a traditional examination. This may limit your provider's ability to fully assess your condition. If your provider identifies any concerns that need to be evaluated in person or the need to arrange testing (such as labs, EKG, etc.), we will make arrangements to do so. Although advances in technology are sophisticated, we cannot ensure that it will always work on either your end or our end. If the connection with a video visit is poor, the visit may have to be switched to a telephone visit. With either a video or telephone visit, we are not always able to ensure that we have a secure connection.  By engaging in this virtual visit, you consent to the provision of healthcare and authorize for your insurance to be billed (if applicable) for the services provided during this visit. Depending on your insurance coverage, you may receive a charge related to this service.  I need to obtain your verbal consent now. Are you willing to proceed with your visit today? Crystal Koch has provided verbal consent on 04/06/2023 for a virtual visit (video or telephone). [name], PA-C  Date: 04/06/2023 8:39 AM  Virtual Visit via Video Note   I, [name], connected with  Crystal Koch  ([id number], 25-Jul-1980) on 04/06/23 at  8:45 AM EDT by a video-enabled telemedicine application and verified that I am speaking with the correct person using two identifiers.  Location: Patient: Virtual Visit Location  Patient: Home Provider: Virtual Visit Location Provider: Home Office   I discussed the limitations of evaluation and management by telemedicine and the availability of in person appointments. The patient expressed understanding and agreed to proceed.    History of Present Illness: Crystal Koch is a 42 y.o. who identifies as a female who was assigned female at birth, and is being seen today for vaginal discharge and itching.  HPI: Vaginal Discharge The patient's primary symptoms include genital itching and vaginal discharge. The patient's pertinent negatives include no genital lesions, genital odor, genital rash, missed menses or pelvic pain. This is a new problem. The current episode started yesterday. The problem occurs constantly. The problem has been unchanged. The pain is mild. She is not pregnant. Pertinent negatives include no chills, dysuria, fever, flank pain or nausea. The vaginal discharge was white and thick. There has been no bleeding. She has not been passing clots. She has not been passing tissue. Nothing aggravates the symptoms. She has tried nothing for the symptoms. The treatment provided no relief.     Problems:  Patient Active Problem List   Diagnosis Date Noted   Acute non-recurrent sinusitis 05/31/2021   Vitamin D deficiency 06/22/2014   Sciatica of left side 06/19/2014   Current smoker 06/19/2014    Allergies: No Known Allergies Medications:  Current Outpatient Medications:    fluconazole (DIFLUCAN) 150 MG tablet, Take 1 tablet (150 mg total) by mouth every 3 (three) days as needed., Disp: 2 tablet, Rfl: 0   cyclobenzaprine (FLEXERIL) 5  MG tablet, Take 1 tablet (5 mg total) by mouth 3 (three) times daily as needed for muscle spasms., Disp: 30 tablet, Rfl: 0   gabapentin (NEURONTIN) 100 MG capsule, TAKE 1 CAPSULE(100 MG) BY MOUTH THREE TIMES DAILY, Disp: 90 capsule, Rfl: 0   meloxicam (MOBIC) 15 MG tablet, TAKE 1 TABLET BY MOUTH EVERY DAY, Disp: 30 tablet, Rfl: 3    omeprazole (PRILOSEC) 40 MG capsule, Take 1 capsule (40 mg total) by mouth daily., Disp: 90 capsule, Rfl: 1   predniSONE (DELTASONE) 20 MG tablet, Take 2 tablets daily with breakfast., Disp: 10 tablet, Rfl: 0  Observations/Objective: Patient is well-developed, well-nourished in no acute distress.  Resting comfortably at home.  Head is normocephalic, atraumatic.  No labored breathing.  Speech is clear and coherent with logical content.  Patient is alert and oriented at baseline.    Assessment and Plan: 1. Yeast vaginitis - fluconazole (DIFLUCAN) 150 MG tablet; Take 1 tablet (150 mg total) by mouth every 3 (three) days as needed.  Dispense: 2 tablet; Refill: 0  - Symptoms consistent with yeast vaginitis - Fluconazole prescribed - Limit bubble baths, scented lotions/soaps/detergents - Limit tight fitting clothing - Seek on person evaluation if not improving or if symptoms worsen   Follow Up Instructions: I discussed the assessment and treatment plan with the patient. The patient was provided an opportunity to ask questions and all were answered. The patient agreed with the plan and demonstrated an understanding of the instructions.  A copy of instructions were sent to the patient via MyChart unless otherwise noted below.    The patient was advised to call back or seek an in-person evaluation if the symptoms worsen or if the condition fails to improve as anticipated.    [name], PA-C

## 2023-04-17 ENCOUNTER — Encounter: Payer: Self-pay | Admitting: Physical Medicine and Rehabilitation

## 2023-04-18 ENCOUNTER — Telehealth: Payer: Self-pay | Admitting: Physician Assistant

## 2023-04-18 ENCOUNTER — Encounter: Payer: Self-pay | Admitting: Physician Assistant

## 2023-04-18 ENCOUNTER — Ambulatory Visit: Payer: Medicaid Other | Attending: Physician Assistant | Admitting: Physician Assistant

## 2023-04-18 ENCOUNTER — Other Ambulatory Visit (HOSPITAL_COMMUNITY)
Admission: RE | Admit: 2023-04-18 | Discharge: 2023-04-18 | Disposition: A | Discharge status: Home / Self Care | Payer: Medicaid Other | Source: Ambulatory Visit | Attending: Physician Assistant | Admitting: Physician Assistant

## 2023-04-18 VITALS — BP 142/92 | HR 92 | Wt 148.0 lb

## 2023-04-18 DIAGNOSIS — Z1331 Encounter for screening for depression: Secondary | ICD-10-CM | POA: Diagnosis not present

## 2023-04-18 DIAGNOSIS — F419 Anxiety disorder, unspecified: Secondary | ICD-10-CM | POA: Diagnosis not present

## 2023-04-18 DIAGNOSIS — B3731 Acute candidiasis of vulva and vagina: Secondary | ICD-10-CM

## 2023-04-18 DIAGNOSIS — M5416 Radiculopathy, lumbar region: Secondary | ICD-10-CM | POA: Diagnosis not present

## 2023-04-18 DIAGNOSIS — H6500 Acute serous otitis media, unspecified ear: Secondary | ICD-10-CM

## 2023-04-18 DIAGNOSIS — M51369 Other intervertebral disc degeneration, lumbar region without mention of lumbar back pain or lower extremity pain: Secondary | ICD-10-CM

## 2023-04-18 MED ORDER — HYDROXYZINE PAMOATE 25 MG PO CAPS
25.0000 mg | ORAL_CAPSULE | Freq: Three times a day (TID) | ORAL | 1 refills | Status: DC | PRN
Start: 2023-04-18 — End: 2023-06-25

## 2023-04-18 MED ORDER — CYCLOBENZAPRINE HCL 5 MG PO TABS
5.0000 mg | ORAL_TABLET | Freq: Three times a day (TID) | ORAL | 0 refills | Status: DC | PRN
Start: 2023-04-18 — End: 2023-10-04

## 2023-04-18 MED ORDER — FLUTICASONE PROPIONATE 50 MCG/ACT NA SUSP
2.0000 | Freq: Every day | NASAL | 6 refills | Status: AC
Start: 2023-04-18 — End: ?

## 2023-04-18 MED ORDER — AMOXICILLIN 500 MG PO CAPS
500.0000 mg | ORAL_CAPSULE | Freq: Three times a day (TID) | ORAL | 0 refills | Status: AC
Start: 2023-04-18 — End: 2023-04-28

## 2023-04-18 MED ORDER — FLUOXETINE HCL 20 MG PO TABS
20.0000 mg | ORAL_TABLET | Freq: Every day | ORAL | 1 refills | Status: DC
Start: 2023-04-18 — End: 2023-04-19

## 2023-04-18 MED ORDER — FLUCONAZOLE 150 MG PO TABS
150.0000 mg | ORAL_TABLET | ORAL | 0 refills | Status: DC | PRN
Start: 2023-04-18 — End: 2023-06-25

## 2023-04-18 MED ORDER — MELOXICAM 15 MG PO TABS
15.0000 mg | ORAL_TABLET | Freq: Every day | ORAL | 3 refills | Status: DC
Start: 2023-04-18 — End: 2023-06-25
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
## 2023-04-18 NOTE — Telephone Encounter (Signed)
Just started her on prozac and hydroxyzine which she was on previously and it helped.  She would like to have some general counseling on dealing with stressful situations.  Thank you!!

## 2023-04-19 ENCOUNTER — Ambulatory Visit (INDEPENDENT_AMBULATORY_CARE_PROVIDER_SITE_OTHER): Payer: Medicaid Other | Admitting: Podiatry

## 2023-04-19 ENCOUNTER — Other Ambulatory Visit: Payer: Self-pay

## 2023-04-19 ENCOUNTER — Other Ambulatory Visit: Payer: Self-pay | Admitting: Physician Assistant

## 2023-04-19 ENCOUNTER — Encounter: Payer: Self-pay | Admitting: Podiatry

## 2023-04-19 ENCOUNTER — Encounter: Payer: Self-pay | Admitting: Physician Assistant

## 2023-04-19 DIAGNOSIS — M722 Plantar fascial fibromatosis: Secondary | ICD-10-CM | POA: Diagnosis not present

## 2023-04-19 LAB — CERVICOVAGINAL ANCILLARY ONLY
Bacterial Vaginitis (gardnerella): POSITIVE — AB
Candida Glabrata: NEGATIVE
Candida Vaginitis: NEGATIVE
Chlamydia: NEGATIVE
Comment: NEGATIVE
Comment: NEGATIVE
Comment: NEGATIVE
Comment: NEGATIVE
Comment: NEGATIVE
Comment: NORMAL
Neisseria Gonorrhea: NEGATIVE
Trichomonas: NEGATIVE

## 2023-04-19 MED ORDER — TRIAMCINOLONE ACETONIDE 10 MG/ML IJ SUSP
10.0000 mg | Freq: Once | INTRAMUSCULAR | Status: AC
Start: 2023-04-19 — End: 2023-04-19
  Administered 2023-04-19: 10 mg via INTRAMUSCULAR

## 2023-04-19 MED ORDER — FLUOXETINE HCL 20 MG PO TABS: 20.0000 mg | ORAL_TABLET | Freq: Every day | ORAL | 3 refills | Status: DC

## 2023-04-19 MED ORDER — METRONIDAZOLE 500 MG PO TABS
500.0000 mg | ORAL_TABLET | Freq: Two times a day (BID) | ORAL | 0 refills | Status: DC
  Filled 2023-04-19: qty 14, 7d supply, fill #0

## 2023-04-19 NOTE — Progress Notes (Signed)
Chief Complaint  Patient presents with   Injections    Injections in both heels    HPI: 42 y.o. female presents today for bilateral heel pain.  She was last seen by Dr. Al Corpus for plantar fasciitis in June and she received injections during this time.  Patient states that she was doing quite well for some time however she experienced a recurrence of her symptoms about a month ago. States she has tried massaging and icing the affected area.  She presents today wearing slippers.  Patient denies any nausea, vomiting, fever, diarrhea, chest pain, shortness of breath.  Past Medical History:  Diagnosis Date   Asthma 1986   Sciatica 2011   since son was born. exacerbated every winter.     Past Surgical History:  Procedure Laterality Date   CESAREAN SECTION  2011    No Known Allergies  ROS -negative except as noted in HPI   Physical Exam: There were no vitals filed for this visit.  General: The patient is alert and oriented x3 in no acute distress.  Dermatology: Skin is warm, dry and supple bilateral lower extremities. Interspaces are clear of maceration and debris.    Vascular: Palpable pedal pulses bilaterally. Capillary refill within normal limits.  No appreciable edema.  No erythema or calor.  Neurological: Light touch sensation grossly intact bilateral feet.   Musculoskeletal Exam: Pain on direct palpation to the plantar medial calcaneal tubercle bilaterally.  Associated inflammation present.  Mild pes planus foot type appreciated.  No pain on palpation of posterior Achilles or posterior calcaneus.  Radiographic Exam:  Deferred  Assessment/Plan of Care: 1. Bilateral plantar fasciitis      Meds ordered this encounter  Medications   triamcinolone acetonide (KENALOG) 10 MG/ML injection 10 mg   None  Discussed clinical findings with patient today.  # Bilateral plantar fasciitis -Overall patient has done well until recently with some recurrence of her symptoms.   I do feel that another round of injections is appropriate.  See procedure below. -Patient states that she has meloxicam at home.  Instructed patient to start regular meloxicam once a day for the next 3 weeks. -Achilles repair fascial stretching regimen discussed at length with patient with written instructions provided. -Discussed the importance of good shoe gear. -Dispensed night splint, instructed patient to alternate which foot she is wearing it on -Power step over-the-counter inserts dispensed. -Did discuss with patient that while she has had recurrence for some time, she has had several rounds of injections.  I do feel that if she has recurrence in the future, we may need to discuss other modalities such as immobilization, physical therapy, possible surgery.  Follow-up in 2 weeks for reevaluation  Procedure: Injection Tendon/Ligament Discussed alternatives, risks, complications and verbal consent was obtained.  Location: Bilateral heels, plantar fascial insertion. Skin Prep: Alcohol. Injectate: 1cc of 2% Xylocaine plain and 1 cc of Kenalog 10 in each heel Disposition: Patient tolerated procedure well. Injection site dressed with a band-aid.  Post-injection care was discussed and return precautions discussed.     Enna Warwick L. Marchia Bond, AACFAS [facility]     [address], [place] [address]  Office [phone]  Fax [phone]

## 2023-04-19 NOTE — Patient Instructions (Signed)

## 2023-04-20 ENCOUNTER — Other Ambulatory Visit: Payer: Self-pay | Admitting: *Deleted

## 2023-04-20 ENCOUNTER — Other Ambulatory Visit: Payer: Self-pay

## 2023-04-20 ENCOUNTER — Telehealth: Payer: Self-pay

## 2023-04-20 DIAGNOSIS — B3731 Acute candidiasis of vulva and vagina: Secondary | ICD-10-CM

## 2023-04-20 MED ORDER — METRONIDAZOLE 500 MG PO TABS
500.0000 mg | ORAL_TABLET | Freq: Two times a day (BID) | ORAL | 0 refills | Status: DC
Start: 2023-04-20 — End: 2023-06-25

## 2023-04-20 NOTE — Telephone Encounter (Signed)
Patient viewed results and Doctor comment through [facility]

## 2023-04-20 NOTE — Telephone Encounter (Signed)
-----   Message from Georgian Co sent at 04/19/2023  4:48 PM EST ----- Vaginal swab showed bacterial vaginitis so I sent you a prescription.  No STD and no yeast.  Start taking probiotics daily and make sure you are drinking 64 to 80 ounces water daily.  Thanks, Georgian Co, PA-C

## 2023-04-24 ENCOUNTER — Encounter: Payer: Self-pay | Admitting: Physical Medicine and Rehabilitation

## 2023-04-24 ENCOUNTER — Ambulatory Visit (INDEPENDENT_AMBULATORY_CARE_PROVIDER_SITE_OTHER): Payer: Medicaid Other | Admitting: Physical Medicine and Rehabilitation

## 2023-04-24 DIAGNOSIS — M5116 Intervertebral disc disorders with radiculopathy, lumbar region: Secondary | ICD-10-CM

## 2023-04-24 DIAGNOSIS — M5416 Radiculopathy, lumbar region: Secondary | ICD-10-CM

## 2023-04-24 DIAGNOSIS — G8929 Other chronic pain: Secondary | ICD-10-CM

## 2023-04-24 DIAGNOSIS — M5442 Lumbago with sciatica, left side: Secondary | ICD-10-CM

## 2023-04-24 NOTE — Progress Notes (Signed)
Crystal Koch - 42 y.o. female MRN [id number]  Date of birth: [birth year]  Office Visit Note: Visit Date: 04/24/2023 PCP: Hoy Register, MD Referred by: Hoy Register, MD  Subjective: Chief Complaint  Patient presents with   Lower Back - Pain   HPI: Crystal Koch is a 42 y.o. female who comes in today per the request of [place] for evaluation of chronic, worsening and severe bilateral lower back pain radiating to buttocks and hips, right greater than left. Also reports paresthesias to both legs. Pain ongoing for several years, worsens with movement and activity. She describes as sharp and stiff sensation, currently rates as 6 out of 10. Some relief of pain with home exercise regimen, rest and use of medications. Some relief of pain with Meloxicam and Flexeril, she does take Gabapentin intermittently. No history of dedicated physical therapy for her back. Lumbar MRI imaging from 2023 exhibits left paracentral disc protrusion at L5-S1 impinging on left S1 nerve root. No high grade spinal canal stenosis. She underwent left S1 transforaminal epidural steroid injection in our office on 12/21/2022, she reports greater than 80% relief of pain for about 4 months. She also reports increased functional ability post injection. She has been evaluated by both Dr. Willia Craze and Dr. Venita Lick. Dr. Christell Constant did mention possible microdiscectomy, however he feels she can continue with injections if these procedures are beneficial in alleviating her pain. She was also evaluated by Dr. Venita Lick in August, per his note, no surgical intervention recommended at this time. Patient currently working at [employer] in [employer]. Patient denies focal weakness. No recent trauma or falls.      Review of Systems  Musculoskeletal:  Positive for back pain.  Neurological:  Positive for tingling. Negative for focal weakness and weakness.  All other systems reviewed and are negative.  Otherwise per  HPI.  Assessment & Plan: Visit Diagnoses:    ICD-10-CM   1. Chronic bilateral low back pain with bilateral sciatica  M54.42 Ambulatory referral to Physical Medicine Rehab   M54.41    G89.29     2. Lumbar radiculopathy  M54.16 Ambulatory referral to Physical Medicine Rehab    3. Intervertebral disc disorders with radiculopathy, lumbar region  M51.16 Ambulatory referral to Physical Medicine Rehab       Plan: Findings:  Chronic, worsening and severe bilateral lower back pain radiating to buttocks and hips, right greater than left. Patient continues to have severe pain despite good conservative therapies such as home exercise regimen, rest and use of medications. Patients clinical presentation and exam are consistent with S1 nerve pattern. There is left paracentral disc protrusion disc protrusion at L5-S1. I explained to patient that paracentral disc herniations can cause bilateral pain. Next step is to perform bilateral S1 transforaminal epidural steroid injections under fluoroscopic guidance. If good relief of pain with injection we can repeat this procedure infrequently as needed. Should her pain persist would consider obtaining new lumbar MRI imaging and possible referral back to Dr. Shon Baton. She has no questions at this time. She can continue being active and working as tolerated. No red flag symptoms noted upon exam today.     Meds & Orders: No orders of the defined types were placed in this encounter.   Orders Placed This Encounter  Procedures   Ambulatory referral to Physical Medicine Rehab    Follow-up: Return for BIlateral S1 transforaminal epidural steroid injection.   Procedures: No procedures performed      Clinical History: MRI LUMBAR  SPINE WITHOUT CONTRAST   TECHNIQUE: Multiplanar, multisequence MR imaging of the lumbar spine was performed. No intravenous contrast was administered.   COMPARISON:  None Available.   FINDINGS: Segmentation:  5 lumbar type vertebrae    Alignment:  Physiologic.   Vertebrae:  No fracture, evidence of discitis, or bone lesion.   Conus medullaris and cauda equina: Conus extends to the L2 level. Conus and cauda equina appear normal.   Paraspinal and other soft tissues: Negative for perispinal mass or inflammation. 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   Disc levels:   Diffusely preserved disc height and hydration. Left paracentral herniation at L5-S1 which impinges on the left S1 nerve root at the subarticular recess. Negative facets and foramina.   IMPRESSION: L5-S1 left paracentral protrusion impinging on the left S1 nerve root.     Electronically Signed   By: Tiburcio Pea M.D.   On: 03/15/2022 05:31   She reports that she has been smoking cigarettes. She has a 8 pack-year smoking history. She has never used smokeless tobacco. No results for input(s): "HGBA1C", "LABURIC" in the last 8760 hours.  Objective:  VS:  HT:    WT:   BMI:     BP:   HR: bpm  TEMP: ( )  RESP:  Physical Exam Vitals and nursing note reviewed.  HENT:     Head: Normocephalic and atraumatic.     Right Ear: External ear normal.     Left Ear: External ear normal.     Nose: Nose normal.     Mouth/Throat:     Mouth: Mucous membranes are moist.  Eyes:     Extraocular Movements: Extraocular movements intact.  Cardiovascular:     Rate and Rhythm: Normal rate.     Pulses: Normal pulses.  Pulmonary:     Effort: Pulmonary effort is normal.  Abdominal:     General: Abdomen is flat. There is no distension.  Musculoskeletal:        General: Tenderness present.     Cervical back: Normal range of motion.     Comments: Patient rises from seated position to standing without difficulty. Good lumbar range of motion. No pain noted with facet loading. 5/5 strength noted with bilateral hip flexion, knee flexion/extension, ankle dorsiflexion/plantarflexion and EHL. No clonus noted bilaterally. No pain upon  palpation of greater trochanters. No pain with internal/external rotation of bilateral hips. Sensation intact bilaterally. Dysesthesias noted to bilateral S1 dermatomes. Negative slump test bilaterally. Ambulates without aid, gait steady.     Skin:    General: Skin is warm and dry.     Capillary Refill: Capillary refill takes less than 2 seconds.  Neurological:     General: No focal deficit present.     Mental Status: She is alert and oriented to person, place, and time.  Psychiatric:        Mood and Affect: Mood normal.        Behavior: Behavior normal.     Ortho Exam  Imaging: No results found.  Past Medical/Family/Surgical/Social History: Medications & Allergies reviewed per EMR, new medications updated. Patient Active Problem List   Diagnosis Date Noted   Positive depression screening 04/18/2023   Lumbar radiculopathy 04/18/2023   Acute non-recurrent sinusitis 05/31/2021   Vitamin D deficiency 06/22/2014   Sciatica of left side 06/19/2014   Current smoker 06/19/2014   Past Medical History:  Diagnosis Date   Asthma 1986   Sciatica 2011   since  son was born. exacerbated every winter.    Family History  Problem Relation Age of Onset   Cancer Maternal Grandmother    Cancer Father    Thyroid disease Mother    Hypertension Maternal Uncle    Diabetes Neg Hx    Past Surgical History:  Procedure Laterality Date   CESAREAN SECTION  2011   Social History   Occupational History   Occupation: Homemaker   Tobacco Use   Smoking status: Every Day    Current packs/day: 0.50    Average packs/day: 0.5 packs/day for 16.0 years (8.0 ttl pk-yrs)    Types: Cigarettes   Smokeless tobacco: Never  Vaping Use   Vaping status: Never Used  Substance and Sexual Activity   Alcohol use: No   Drug use: Yes    Types: Marijuana   Sexual activity: Yes    Birth control/protection: None

## 2023-04-25 ENCOUNTER — Telehealth: Payer: Self-pay | Admitting: Licensed Clinical Social Worker

## 2023-04-25 NOTE — Telephone Encounter (Signed)
LCSWA called patient today to introduce herself and to assess patients' mental health needs. Patient was referred by PCP for life stressors and needing some cousneling. Pt reports having some car issues as well. PCP started her on prozac and hydroxyzine. Appt with LCSWA is 11/25

## 2023-04-26 ENCOUNTER — Telehealth: Payer: Medicaid Other | Admitting: Physician Assistant

## 2023-04-26 DIAGNOSIS — R6889 Other general symptoms and signs: Secondary | ICD-10-CM | POA: Diagnosis not present

## 2023-04-26 MED ORDER — PREDNISONE 10 MG (21) PO TBPK
ORAL_TABLET | ORAL | 0 refills | Status: DC
Start: 2023-04-26 — End: 2023-05-29

## 2023-04-26 NOTE — Progress Notes (Signed)
Virtual Visit Consent   [facility], you are scheduled for a virtual visit with a [facility] provider today. Just as with appointments in the office, your consent must be obtained to participate. Your consent will be active for this visit and any virtual visit you may have with one of our providers in the next 365 days. If you have a MyChart account, a copy of this consent can be sent to you electronically.  As this is a virtual visit, video technology does not allow for your provider to perform a traditional examination. This may limit your provider's ability to fully assess your condition. If your provider identifies any concerns that need to be evaluated in person or the need to arrange testing (such as labs, EKG, etc.), we will make arrangements to do so. Although advances in technology are sophisticated, we cannot ensure that it will always work on either your end or our end. If the connection with a video visit is poor, the visit may have to be switched to a telephone visit. With either a video or telephone visit, we are not always able to ensure that we have a secure connection.  By engaging in this virtual visit, you consent to the provision of healthcare and authorize for your insurance to be billed (if applicable) for the services provided during this visit. Depending on your insurance coverage, you may receive a charge related to this service.  I need to obtain your verbal consent now. Are you willing to proceed with your visit today? Crystal Koch has provided verbal consent on 04/26/2023 for a virtual visit (video or telephone). [name], PA-C  Date: 04/26/2023 11:08 AM  Virtual Visit via Video Note   I, [name], connected with  Crystal Koch  ([id number], [birth year]) on 04/26/23 at 11:00 AM EST by a video-enabled telemedicine application and verified that I am speaking with the correct person using two identifiers.  Location: Patient: Virtual Visit Location  Patient: Home Provider: Virtual Visit Location Provider: Home Office   I discussed the limitations of evaluation and management by telemedicine and the availability of in person appointments. The patient expressed understanding and agreed to proceed.    History of Present Illness: Crystal Koch is a 42 y.o. who identifies as a female who was assigned female at birth, and is being seen today for flu-like symptoms.  HPI: URI  This is a new problem. The current episode started yesterday. The problem has been unchanged. There has been no fever. Associated symptoms include congestion, headaches, joint pain, a plugged ear sensation (did have prior to that and was seen on 04/18/23 and prescribed Amoxicillin which she is still on) and rhinorrhea (and post nasal drainage). Pertinent negatives include no chest pain, coughing, diarrhea, ear pain, nausea, sinus pain, sore throat, swollen glands, vomiting or wheezing. Associated symptoms comments: Body aches. She has tried acetaminophen and NSAIDs (amoxicillin) for the symptoms. The treatment provided no relief.    Has been around sick coworker.  Problems:  Patient Active Problem List   Diagnosis Date Noted   Positive depression screening 04/18/2023   Lumbar radiculopathy 04/18/2023   Acute non-recurrent sinusitis 05/31/2021   Vitamin D deficiency 06/22/2014   Sciatica of left side 06/19/2014   Current smoker 06/19/2014    Allergies: No Known Allergies Medications:  Current Outpatient Medications:    predniSONE (STERAPRED UNI-PAK 21 TAB) 10 MG (21) TBPK tablet, 6 day taper; take as directed on package instructions, Disp: 21 tablet, Rfl: 0  amoxicillin (AMOXIL) 500 MG capsule, Take 1 capsule (500 mg total) by mouth 3 (three) times daily for 10 days., Disp: 30 capsule, Rfl: 0   cyclobenzaprine (FLEXERIL) 5 MG tablet, Take 1 tablet (5 mg total) by mouth 3 (three) times daily as needed for muscle spasms., Disp: 30 tablet, Rfl: 0   fluconazole (DIFLUCAN)  150 MG tablet, Take 1 tablet (150 mg total) by mouth every 3 (three) days as needed., Disp: 2 tablet, Rfl: 0   FLUoxetine (PROZAC) 20 MG tablet, Take 1 tablet (20 mg total) by mouth daily., Disp: 90 tablet, Rfl: 3   fluticasone (FLONASE) 50 MCG/ACT nasal spray, Place 2 sprays into both nostrils daily., Disp: 16 g, Rfl: 6   gabapentin (NEURONTIN) 100 MG capsule, TAKE 1 CAPSULE(100 MG) BY MOUTH THREE TIMES DAILY, Disp: 90 capsule, Rfl: 0   hydrOXYzine (VISTARIL) 25 MG capsule, Take 1 capsule (25 mg total) by mouth every 8 (eight) hours as needed. For severe anxiety, Disp: 40 capsule, Rfl: 1   meloxicam (MOBIC) 15 MG tablet, Take 1 tablet (15 mg total) by mouth daily., Disp: 30 tablet, Rfl: 3   metroNIDAZOLE (FLAGYL) 500 MG tablet, Take 1 tablet (500 mg total) by mouth 2 (two) times daily., Disp: 14 tablet, Rfl: 0   omeprazole (PRILOSEC) 40 MG capsule, Take 1 capsule (40 mg total) by mouth daily. (Patient not taking: Reported on 04/18/2023), Disp: 90 capsule, Rfl: 1  Observations/Objective: Patient is well-developed, well-nourished in no acute distress.  Resting comfortably at home.  Head is normocephalic, atraumatic.  No labored breathing.  Speech is clear and coherent with logical content.  Patient is alert and oriented at baseline.    Assessment and Plan: 1. Flu-like symptoms - predniSONE (STERAPRED UNI-PAK 21 TAB) 10 MG (21) TBPK tablet; 6 day taper; take as directed on package instructions  Dispense: 21 tablet; Refill: 0  - Suspect viral URI; advised to take Covid/Flu at home test - Symptomatic medications of choice over the counter as needed - Prednisone for headache/body aches (Hold Meloxicam) - Continue Amoxil for ear infection - Tylenol as needed for breakthrough pain - Push fluids - Rest - Seek further evaluation if symptoms change or worsen   Follow Up Instructions: I discussed the assessment and treatment plan with the patient. The patient was provided an opportunity to ask  questions and all were answered. The patient agreed with the plan and demonstrated an understanding of the instructions.  A copy of instructions were sent to the patient via MyChart unless otherwise noted below.    The patient was advised to call back or seek an in-person evaluation if the symptoms worsen or if the condition fails to improve as anticipated.    [name], PA-C

## 2023-04-26 NOTE — Patient Instructions (Signed)
Crystal Koch, thank you for joining Crystal Loveless, PA-C for today's virtual visit.  While this provider is not your primary care provider (PCP), if your PCP is located in our provider database this encounter information will be shared with them immediately following your visit.   A [facility] MyChart account gives you access to today's visit and all your visits, tests, and labs performed at [facility] " click here if you don't have a [facility] MyChart account or go to [email or web address]  Consent: (Patient) Crystal Koch provided verbal consent for this virtual visit at the beginning of the encounter.  Current Medications:  Current Outpatient Medications:    predniSONE (STERAPRED UNI-PAK 21 TAB) 10 MG (21) TBPK tablet, 6 day taper; take as directed on package instructions, Disp: 21 tablet, Rfl: 0   amoxicillin (AMOXIL) 500 MG capsule, Take 1 capsule (500 mg total) by mouth 3 (three) times daily for 10 days., Disp: 30 capsule, Rfl: 0   cyclobenzaprine (FLEXERIL) 5 MG tablet, Take 1 tablet (5 mg total) by mouth 3 (three) times daily as needed for muscle spasms., Disp: 30 tablet, Rfl: 0   fluconazole (DIFLUCAN) 150 MG tablet, Take 1 tablet (150 mg total) by mouth every 3 (three) days as needed., Disp: 2 tablet, Rfl: 0   FLUoxetine (PROZAC) 20 MG tablet, Take 1 tablet (20 mg total) by mouth daily., Disp: 90 tablet, Rfl: 3   fluticasone (FLONASE) 50 MCG/ACT nasal spray, Place 2 sprays into both nostrils daily., Disp: 16 g, Rfl: 6   gabapentin (NEURONTIN) 100 MG capsule, TAKE 1 CAPSULE(100 MG) BY MOUTH THREE TIMES DAILY, Disp: 90 capsule, Rfl: 0   hydrOXYzine (VISTARIL) 25 MG capsule, Take 1 capsule (25 mg total) by mouth every 8 (eight) hours as needed. For severe anxiety, Disp: 40 capsule, Rfl: 1   meloxicam (MOBIC) 15 MG tablet, Take 1 tablet (15 mg total) by mouth daily., Disp: 30 tablet, Rfl: 3   metroNIDAZOLE (FLAGYL) 500 MG tablet, Take 1 tablet (500 mg total) by  mouth 2 (two) times daily., Disp: 14 tablet, Rfl: 0   omeprazole (PRILOSEC) 40 MG capsule, Take 1 capsule (40 mg total) by mouth daily. (Patient not taking: Reported on 04/18/2023), Disp: 90 capsule, Rfl: 1   Medications ordered in this encounter:  Meds ordered this encounter  Medications   predniSONE (STERAPRED UNI-PAK 21 TAB) 10 MG (21) TBPK tablet    Sig: 6 day taper; take as directed on package instructions    Dispense:  21 tablet    Refill:  0    Order Specific Question:   Supervising Provider    Answer:   Merrilee Jansky [id number]     *If you need refills on other medications prior to your next appointment, please contact your pharmacy*  Follow-Up: Call back or seek an in-person evaluation if the symptoms worsen or if the condition fails to improve as anticipated.  [facility] [phone]  Other Instructions Viral Respiratory Infection A respiratory infection is an illness that affects part of the respiratory system, such as the lungs, nose, or throat. A respiratory infection that is caused by a virus is called a viral respiratory infection. Common types of viral respiratory infections include: A cold. The flu (influenza). A respiratory syncytial virus (RSV) infection. What are the causes? This condition is caused by a virus. The virus may spread through contact with droplets or direct contact with infected people or their mucus or secretions. The virus may spread from person  to person (is contagious). What are the signs or symptoms? Symptoms of this condition include: A stuffy or runny nose. A sore throat or cough. Shortness of breath or difficulty breathing. Yellow or green mucus (sputum). Other symptoms may include: A fever. Sweating or chills. Fatigue. Achy muscles. A headache. How is this diagnosed? This condition may be diagnosed based on: Your symptoms. A physical exam. Testing of secretions from the nose or throat. Chest X-ray. How is  this treated? This condition may be treated with medicines, such as: Antiviral medicine. This may shorten the length of time a person has symptoms. Expectorants. These make it easier to cough up mucus. Decongestant nasal sprays. Acetaminophen or NSAIDs, such as ibuprofen, to relieve fever and pain. Antibiotic medicines are not prescribed for viral infections.This is because antibiotics are designed to kill bacteria. They do not kill viruses. Follow these instructions at home: Managing pain and congestion Take over-the-counter and prescription medicines only as told by your health care provider. If you have a sore throat, gargle with a mixture of salt and water 3-4 times a day or as needed. To make salt water, completely dissolve -1 tsp (3-6 g) of salt in 1 cup (237 mL) of warm water. Use nose drops made from salt water to ease congestion and soften raw skin around your nose. Take 2 tsp (10 mL) of honey at bedtime to lessen coughing at night. Do not give honey to children who are younger than 1 year. Drink enough fluid to keep your urine pale yellow. This helps prevent dehydration and helps loosen up mucus. General instructions  Rest as much as possible. Do not drink alcohol. Do not use any products that contain nicotine or tobacco. These products include cigarettes, chewing tobacco, and vaping devices, such as e-cigarettes. If you need help quitting, ask your health care provider. Keep all follow-up visits. This is important. How is this prevented?     Get an annual flu shot. You may get the flu shot in late summer, fall, or winter. Ask your health care provider when you should get your flu shot. Avoid spreading your infection to other people. If you are sick: Wash your hands with soap and water often, especially after you cough or sneeze. Wash for at least 20 seconds. If soap and water are not available, use alcohol-based hand sanitizer. Cover your mouth when you cough. Cover your nose  and mouth when you sneeze. Do not share cups or eating utensils. Clean commonly used objects often. Clean commonly touched surfaces. Stay home from work or school as told by your health care provider. Avoid contact with people who are sick during cold and flu season. This is generally fall and winter. Contact a health care provider if: Your symptoms last for 10 days or longer. Your symptoms get worse over time. You have severe sinus pain in your face or forehead. The glands in your jaw or neck become very swollen. You have shortness of breath. Get help right away if you: Feel pain or pressure in your chest. Have trouble breathing. Faint or feel like you will faint. Have severe and persistent vomiting. Feel confused or disoriented. These symptoms may represent a serious problem that is an emergency. Do not wait to see if the symptoms will go away. Get medical help right away. Call your local emergency services (911 in the [place] Do not drive yourself to the hospital. Summary A respiratory infection is an illness that affects part of the respiratory system, such as the  lungs, nose, or throat. A respiratory infection that is caused by a virus is called a viral respiratory infection. Common types of viral respiratory infections include a cold, influenza, and respiratory syncytial virus (RSV) infection. Symptoms of this condition include a stuffy or runny nose, cough, fatigue, achy muscles, sore throat, and fevers or chills. Antibiotic medicines are not prescribed for viral infections. This is because antibiotics are designed to kill bacteria. They are not effective against viruses. This information is not intended to replace advice given to you by your health care provider. Make sure you discuss any questions you have with your health care provider. Document Revised: 09/02/2020 Document Reviewed: 09/02/2020 Elsevier Patient Education  2024 [employer]    If you have been instructed to  have an in-person evaluation today at a local Urgent Care facility, please use the link below. It will take you to a list of all of our available [facility], including address, phone number and hours of operation. Please do not delay care.  [facility]  If you or a family member do not have a primary care provider, use the link below to schedule a visit and establish care. When you choose a [facility] primary care physician or advanced practice provider, you gain a long-term partner in health. Find a Primary Care Provider  Learn more about [facility] in-office and virtual care options: [facility] - Get Care Now

## 2023-04-30 ENCOUNTER — Other Ambulatory Visit: Payer: Self-pay | Admitting: Physician Assistant

## 2023-04-30 MED ORDER — FLUOXETINE HCL 20 MG PO CAPS: 20.0000 mg | ORAL_CAPSULE | Freq: Every day | ORAL | 1 refills | Status: DC

## 2023-05-03 ENCOUNTER — Ambulatory Visit: Payer: Medicaid Other | Admitting: Physical Medicine and Rehabilitation

## 2023-05-03 ENCOUNTER — Ambulatory Visit (INDEPENDENT_AMBULATORY_CARE_PROVIDER_SITE_OTHER): Payer: Medicaid Other | Admitting: Podiatry

## 2023-05-03 ENCOUNTER — Encounter: Payer: Self-pay | Admitting: Podiatry

## 2023-05-03 ENCOUNTER — Other Ambulatory Visit: Payer: Self-pay

## 2023-05-03 DIAGNOSIS — M5416 Radiculopathy, lumbar region: Secondary | ICD-10-CM | POA: Diagnosis not present

## 2023-05-03 DIAGNOSIS — M2142 Flat foot [pes planus] (acquired), left foot: Secondary | ICD-10-CM

## 2023-05-03 DIAGNOSIS — M722 Plantar fascial fibromatosis: Secondary | ICD-10-CM | POA: Diagnosis not present

## 2023-05-03 DIAGNOSIS — M2141 Flat foot [pes planus] (acquired), right foot: Secondary | ICD-10-CM | POA: Diagnosis not present

## 2023-05-03 MED ORDER — METHYLPREDNISOLONE ACETATE 40 MG/ML IJ SUSP
40.0000 mg | Freq: Once | INTRAMUSCULAR | Status: AC
Start: 2023-05-03 — End: 2023-05-03
  Administered 2023-05-03: 40 mg

## 2023-05-03 NOTE — Patient Instructions (Signed)

## 2023-05-03 NOTE — Progress Notes (Signed)
       Chief Complaint  Patient presents with   Plantar Fasciitis    Bilateral Plantar Fasciitis.  Doing well since last injection was given.    HPI: 42 y.o. female presents today for follow-up evaluation of bilateral.  Fasciitis.  She was last seen on 11/07 where she received bilateral injections.  She has been using the night splints, stretching as directed, using power step inserts.  She does present today wearing slippers.  She reports near complete resolution of her symptoms.  Past Medical History:  Diagnosis Date   Asthma 1986   Sciatica 2011   since son was born. exacerbated every winter.     Past Surgical History:  Procedure Laterality Date   CESAREAN SECTION  2011    No Known Allergies     Physical Exam: There were no vitals filed for this visit.  General: The patient is alert and oriented x3 in no acute distress.  Dermatology: Skin is warm, dry and supple bilateral lower extremities. Interspaces are clear of maceration and debris.    Vascular: Palpable pedal pulses bilaterally. Capillary refill within normal limits.  No appreciable edema.  No erythema or calor.  Neurological: Light touch sensation grossly intact bilateral feet.   Musculoskeletal Exam: No pain on palpation of the ATFL heel tubercle.  Pes planus foot type appreciated.  No acute inflammation appreciated.  No pain on palpation of the posterior Achilles or posterior calcaneus.  No significant equinus appreciated   Assessment/Plan of Care: 1. Bilateral plantar fasciitis   2. Pes planus of both feet      No orders of the defined types were placed in this encounter.  None  Discussed clinical findings with patient today.  # Bilateral plantar fasciitis -Patient has experienced near complete resolution of her symptoms -Discussed the importance of good quality shoe gear to support foot structure and biomechanics to limit the recurrence of symptoms. This is necessary due to her flatfoot morphology.  I counseled her that she needs to be wearing good supportive shoes whenever she is ambulatory outside of the house such as [place], [place], [employer], new balance -Instructed patient to continue RICE therapy, continue with plantar fascia stretching regimen, continue night splint, continue power step inserts until symptoms resolve. -Follow-up as needed.   Espyn Radwan L. Marchia Bond, AACFAS [facility]     [address] [place], [place] [address]                Office [phone]  Fax [phone]

## 2023-05-03 NOTE — Progress Notes (Signed)
Functional Pain Scale - descriptive words and definitions  Unmanageable (7)  Pain interferes with normal ADL's/nothing seems to help/sleep is very difficult/active distractions are very difficult to concentrate on. Severe range order  Average Pain 7  Numbness / tingling down R leg only.  She is having pain bil side though.  +Driver, -BT, -Dye Allergies.

## 2023-05-03 NOTE — Patient Instructions (Signed)

## 2023-05-06 ENCOUNTER — Other Ambulatory Visit: Payer: Self-pay | Admitting: Nurse Practitioner

## 2023-05-06 ENCOUNTER — Telehealth: Payer: Medicaid Other | Admitting: Nurse Practitioner

## 2023-05-06 DIAGNOSIS — L0291 Cutaneous abscess, unspecified: Secondary | ICD-10-CM

## 2023-05-06 MED ORDER — CHLORHEXIDINE GLUCONATE 0.12 % MT SOLN
15.0000 mL | Freq: Two times a day (BID) | OROMUCOSAL | 0 refills | Status: DC
Start: 2023-05-06 — End: 2023-05-07

## 2023-05-06 MED ORDER — PENICILLIN V POTASSIUM 500 MG PO TABS
500.0000 mg | ORAL_TABLET | Freq: Four times a day (QID) | ORAL | 0 refills | Status: AC
Start: 2023-05-06 — End: 2023-05-13

## 2023-05-06 NOTE — Progress Notes (Signed)
Virtual Visit Consent   Crystal Koch, you are scheduled for a virtual visit with a [facility] provider today. Just as with appointments in the office, your consent must be obtained to participate. Your consent will be active for this visit and any virtual visit you may have with one of our providers in the next 365 days. If you have a MyChart account, a copy of this consent can be sent to you electronically.  As this is a virtual visit, video technology does not allow for your provider to perform a traditional examination. This may limit your provider's ability to fully assess your condition. If your provider identifies any concerns that need to be evaluated in person or the need to arrange testing (such as labs, EKG, etc.), we will make arrangements to do so. Although advances in technology are sophisticated, we cannot ensure that it will always work on either your end or our end. If the connection with a video visit is poor, the visit may have to be switched to a telephone visit. With either a video or telephone visit, we are not always able to ensure that we have a secure connection.  By engaging in this virtual visit, you consent to the provision of healthcare and authorize for your insurance to be billed (if applicable) for the services provided during this visit. Depending on your insurance coverage, you may receive a charge related to this service.  I need to obtain your verbal consent now. Are you willing to proceed with your visit today? Crystal Koch has provided verbal consent on 05/06/2023 for a virtual visit (video or telephone). Crystal Rigg, NP  Date: 05/06/2023 8:52 AM  Virtual Visit via Video Note   I, Crystal Koch, connected with  Crystal Koch  ([id number], Jul 20, 1980) on 05/06/23 at  8:45 AM EST by a video-enabled telemedicine application and verified that I am speaking with the correct person using two identifiers.  Location: Patient: Virtual Visit Location Patient:  Home Provider: Virtual Visit Location Provider: Home Office   I discussed the limitations of evaluation and management by telemedicine and the availability of in person appointments. The patient expressed understanding and agreed to proceed.    History of Present Illness: Crystal Koch is a 42 y.o. who identifies as a female who was assigned female at birth, and is being seen today for dental abscess.  Crystal Koch endorses pain and swelling of the gum in the upper right side of her mouth above the right front tooth. She has not seen a dentist for this. There is associated pain with palpation of the area of concern per her report. She is not UTD with dental visits/routine cleaning/exams. She is a smoker and needs to have an oral evaluation performed to rule out any oral cancer.    Problems:  Patient Active Problem List   Diagnosis Date Noted   Positive depression screening 04/18/2023   Lumbar radiculopathy 04/18/2023   Acute non-recurrent sinusitis 05/31/2021   Vitamin D deficiency 06/22/2014   Sciatica of left side 06/19/2014   Current smoker 06/19/2014    Allergies: No Known Allergies Medications:  Current Outpatient Medications:    chlorhexidine (PERIDEX) 0.12 % solution, Use as directed 15 mLs in the mouth or throat 2 (two) times daily., Disp: 1000 mL, Rfl: 0   penicillin v potassium (VEETID) 500 MG tablet, Take 1 tablet (500 mg total) by mouth 4 (four) times daily for 7 days., Disp: 28 tablet, Rfl: 0   cyclobenzaprine (FLEXERIL) 5 MG  tablet, Take 1 tablet (5 mg total) by mouth 3 (three) times daily as needed for muscle spasms., Disp: 30 tablet, Rfl: 0   fluconazole (DIFLUCAN) 150 MG tablet, Take 1 tablet (150 mg total) by mouth every 3 (three) days as needed., Disp: 2 tablet, Rfl: 0   FLUoxetine (PROZAC) 20 MG capsule, Take 1 capsule (20 mg total) by mouth daily., Disp: 90 capsule, Rfl: 1   fluticasone (FLONASE) 50 MCG/ACT nasal spray, Place 2 sprays into both nostrils daily., Disp: 16  g, Rfl: 6   gabapentin (NEURONTIN) 100 MG capsule, TAKE 1 CAPSULE(100 MG) BY MOUTH THREE TIMES DAILY, Disp: 90 capsule, Rfl: 0   hydrOXYzine (VISTARIL) 25 MG capsule, Take 1 capsule (25 mg total) by mouth every 8 (eight) hours as needed. For severe anxiety, Disp: 40 capsule, Rfl: 1   meloxicam (MOBIC) 15 MG tablet, Take 1 tablet (15 mg total) by mouth daily., Disp: 30 tablet, Rfl: 3   metroNIDAZOLE (FLAGYL) 500 MG tablet, Take 1 tablet (500 mg total) by mouth 2 (two) times daily., Disp: 14 tablet, Rfl: 0   omeprazole (PRILOSEC) 40 MG capsule, Take 1 capsule (40 mg total) by mouth daily. (Patient not taking: Reported on 04/18/2023), Disp: 90 capsule, Rfl: 1   predniSONE (STERAPRED UNI-PAK 21 TAB) 10 MG (21) TBPK tablet, 6 day taper; take as directed on package instructions, Disp: 21 tablet, Rfl: 0  Current Facility-Administered Medications:    methylPREDNISolone acetate (DEPO-MEDROL) injection 40 mg, 40 mg, Other, Once,   Observations/Objective: Patient is well-developed, well-nourished in no acute distress.  Resting comfortably at home.  Head is normocephalic, atraumatic.  No labored breathing.  Speech is clear and coherent with logical content.  Patient is alert and oriented at baseline.  Swelling noted of the upper right gumline and beneath right top lip  Assessment and Plan: 1. Abscess - chlorhexidine (PERIDEX) 0.12 % solution; Use as directed 15 mLs in the mouth or throat 2 (two) times daily.  Dispense: 1000 mL; Refill: 0 - penicillin v potassium (VEETID) 500 MG tablet; Take 1 tablet (500 mg total) by mouth 4 (four) times daily for 7 days.  Dispense: 28 tablet; Refill: 0  She is a smoker and needs to have an oral evaluation performed to rule out any oral cancer as this is a re occurring problem  Follow Up Instructions: I discussed the assessment and treatment plan with the patient. The patient was provided an opportunity to ask questions and all were answered. The patient agreed with  the plan and demonstrated an understanding of the instructions.  A copy of instructions were sent to the patient via MyChart unless otherwise noted below.    The patient was advised to call back or seek an in-person evaluation if the symptoms worsen or if the condition fails to improve as anticipated.    Crystal Rigg, NP

## 2023-05-06 NOTE — Patient Instructions (Signed)
Crystal Koch, thank you for joining Claiborne Rigg, NP for today's virtual visit.  While this provider is not your primary care provider (PCP), if your PCP is located in our provider database this encounter information will be shared with them immediately following your visit.   A [facility] MyChart account gives you access to today's visit and all your visits, tests, and labs performed at [facility] " click here if you don't have a [facility] MyChart account or go to [email or web address]  Consent: (Patient) Crystal Koch provided verbal consent for this virtual visit at the beginning of the encounter.  Current Medications:  Current Outpatient Medications:    chlorhexidine (PERIDEX) 0.12 % solution, Use as directed 15 mLs in the mouth or throat 2 (two) times daily., Disp: 1000 mL, Rfl: 0   penicillin v potassium (VEETID) 500 MG tablet, Take 1 tablet (500 mg total) by mouth 4 (four) times daily for 7 days., Disp: 28 tablet, Rfl: 0   cyclobenzaprine (FLEXERIL) 5 MG tablet, Take 1 tablet (5 mg total) by mouth 3 (three) times daily as needed for muscle spasms., Disp: 30 tablet, Rfl: 0   fluconazole (DIFLUCAN) 150 MG tablet, Take 1 tablet (150 mg total) by mouth every 3 (three) days as needed., Disp: 2 tablet, Rfl: 0   FLUoxetine (PROZAC) 20 MG capsule, Take 1 capsule (20 mg total) by mouth daily., Disp: 90 capsule, Rfl: 1   fluticasone (FLONASE) 50 MCG/ACT nasal spray, Place 2 sprays into both nostrils daily., Disp: 16 g, Rfl: 6   gabapentin (NEURONTIN) 100 MG capsule, TAKE 1 CAPSULE(100 MG) BY MOUTH THREE TIMES DAILY, Disp: 90 capsule, Rfl: 0   hydrOXYzine (VISTARIL) 25 MG capsule, Take 1 capsule (25 mg total) by mouth every 8 (eight) hours as needed. For severe anxiety, Disp: 40 capsule, Rfl: 1   meloxicam (MOBIC) 15 MG tablet, Take 1 tablet (15 mg total) by mouth daily., Disp: 30 tablet, Rfl: 3   metroNIDAZOLE (FLAGYL) 500 MG tablet, Take 1 tablet (500 mg total) by mouth 2  (two) times daily., Disp: 14 tablet, Rfl: 0   omeprazole (PRILOSEC) 40 MG capsule, Take 1 capsule (40 mg total) by mouth daily. (Patient not taking: Reported on 04/18/2023), Disp: 90 capsule, Rfl: 1   predniSONE (STERAPRED UNI-PAK 21 TAB) 10 MG (21) TBPK tablet, 6 day taper; take as directed on package instructions, Disp: 21 tablet, Rfl: 0  Current Facility-Administered Medications:    methylPREDNISolone acetate (DEPO-MEDROL) injection 40 mg, 40 mg, Other, Once,    Medications ordered in this encounter:  Meds ordered this encounter  Medications   chlorhexidine (PERIDEX) 0.12 % solution    Sig: Use as directed 15 mLs in the mouth or throat 2 (two) times daily.    Dispense:  1000 mL    Refill:  0    Order Specific Question:   Supervising Provider    Answer:   Merrilee Jansky [id number]   penicillin v potassium (VEETID) 500 MG tablet    Sig: Take 1 tablet (500 mg total) by mouth 4 (four) times daily for 7 days.    Dispense:  28 tablet    Refill:  0    Order Specific Question:   Supervising Provider    Answer:   Merrilee Jansky [id number]     *If you need refills on other medications prior to your next appointment, please contact your pharmacy*  Follow-Up: Call back or seek an in-person evaluation if the symptoms worsen or if the condition  fails to improve as anticipated.  [facility] [phone]  Other Instructions She is a smoker and needs to have an oral evaluation performed to rule out any oral cancer as this is a re occurring problem   If you have been instructed to have an in-person evaluation today at a local Urgent Care facility, please use the link below. It will take you to a list of all of our available [facility], including address, phone number and hours of operation. Please do not delay care.  [facility]  If you or a family member do not have a primary care provider, use the link below to schedule a visit and establish care.  When you choose a [facility] primary care physician or advanced practice provider, you gain a long-term partner in health. Find a Primary Care Provider  Learn more about [facility]'s in-office and virtual care options:  - Get Care Now

## 2023-05-07 ENCOUNTER — Ambulatory Visit: Payer: Medicaid Other | Admitting: Licensed Clinical Social Worker

## 2023-05-07 DIAGNOSIS — F419 Anxiety disorder, unspecified: Secondary | ICD-10-CM
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
## 2023-05-08 DIAGNOSIS — H5213 Myopia, bilateral: Secondary | ICD-10-CM | POA: Diagnosis not present

## 2023-05-12 NOTE — Progress Notes (Signed)
Crystal Koch - 42 y.o. female MRN [id number]  Date of birth: 12-Mar-1981  Office Visit Note: Visit Date: 05/03/2023 PCP: Hoy Register, MD Referred by: Hoy Register, MD  Subjective: Chief Complaint  Patient presents with   Lower Back - Pain   HPI:  Crystal Koch is a 42 y.o. female who comes in today at the request of Ellin Goodie, FNP for planned Bilateral S1-2 Lumbar Transforaminal epidural steroid injection with fluoroscopic guidance.  The patient has failed conservative care including home exercise, medications, time and activity modification.  This injection will be diagnostic and hopefully therapeutic.  Please see requesting physician notes for further details and justification.   ROS Otherwise per HPI.  Assessment & Plan: Visit Diagnoses:    ICD-10-CM   1. Lumbar radiculopathy  M54.16 XR C-ARM NO REPORT    Epidural Steroid injection    methylPREDNISolone acetate (DEPO-MEDROL) injection 40 mg      Plan: No additional findings.   Meds & Orders:  Meds ordered this encounter  Medications   methylPREDNISolone acetate (DEPO-MEDROL) injection 40 mg    Orders Placed This Encounter  Procedures   XR C-ARM NO REPORT   Epidural Steroid injection    Follow-up: Return for visit to requesting provider as needed.   Procedures: No procedures performed  S1 Lumbosacral Transforaminal Epidural Steroid Injection - Sub-Pedicular Approach with Fluoroscopic Guidance   Patient: Crystal Koch      Date of Birth: 13-Mar-1981 MRN: [id number] PCP: Hoy Register, MD      Visit Date: 05/03/2023   Universal Protocol:    Date/Time: 05/11/2409:12 AM  Consent Given By: the patient  Position:  PRONE  Additional Comments: Vital signs were monitored before and after the procedure. Patient was prepped and draped in the usual sterile fashion. The correct patient, procedure, and site was verified.   Injection Procedure Details:  Procedure Site One Meds Administered:  Meds ordered  this encounter  Medications   methylPREDNISolone acetate (DEPO-MEDROL) injection 40 mg    Laterality: Bilateral  Location/Site:  S1 Foramen   Needle size: 22 ga.  Needle type: Spinal  Needle Placement: Transforaminal  Findings:   -Comments: Excellent flow of contrast along the nerve, nerve root and into the epidural space.  Epidurogram: Contrast epidurogram showed no nerve root cut off or restricted flow pattern.  Procedure Details: After squaring off the sacral end-plate to get a true AP view, the C-arm was positioned so that the best possible view of the S1 foramen was visualized. The soft tissues overlying this structure were infiltrated with 2-3 ml. of 1% Lidocaine without Epinephrine.    The spinal needle was inserted toward the target using a "trajectory" view along the fluoroscope beam.  Under AP and lateral visualization, the needle was advanced so it did not puncture dura. Biplanar projections were used to confirm position. Aspiration was confirmed to be negative for CSF and/or blood. A 1-2 ml. volume of Isovue-250 was injected and flow of contrast was noted at each level. Radiographs were obtained for documentation purposes.   After attaining the desired flow of contrast documented above, a 0.5 to 1.0 ml test dose of 0.25% Marcaine was injected into each respective transforaminal space.  The patient was observed for 90 seconds post injection.  After no sensory deficits were reported, and normal lower extremity motor function was noted,   the above injectate was administered so that equal amounts of the injectate were placed at each foramen (level) into the transforaminal epidural space.   Additional  Comments:  The patient tolerated the procedure well Dressing: Band-Aid with 2 x 2 sterile gauze    Post-procedure details: Patient was observed during the procedure. Post-procedure instructions were reviewed.  Patient left the clinic in stable condition.   Clinical  History: MRI LUMBAR SPINE WITHOUT CONTRAST   TECHNIQUE: Multiplanar, multisequence MR imaging of the lumbar spine was performed. No intravenous contrast was administered.   COMPARISON:  None Available.   FINDINGS: Segmentation:  5 lumbar type vertebrae   Alignment:  Physiologic.   Vertebrae:  No fracture, evidence of discitis, or bone lesion.   Conus medullaris and cauda equina: Conus extends to the L2 level. Conus and cauda equina appear normal.   Paraspinal and other soft tissues: Negative for perispinal mass or inflammation. 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   Disc levels:   Diffusely preserved disc height and hydration. Left paracentral herniation at L5-S1 which impinges on the left S1 nerve root at the subarticular recess. Negative facets and foramina.   IMPRESSION: L5-S1 left paracentral protrusion impinging on the left S1 nerve root.     Electronically Signed   By: Tiburcio Pea M.D.   On: 03/15/2022 05:31     Objective:  VS:  HT:    WT:   BMI:     BP:   HR: bpm  TEMP: ( )  RESP:  Physical Exam Vitals and nursing note reviewed.  Constitutional:      General: She is not in acute distress.    Appearance: Normal appearance. She is well-developed. She is not ill-appearing.  HENT:     Head: Normocephalic and atraumatic.     Right Ear: External ear normal.     Left Ear: External ear normal.  Eyes:     Extraocular Movements: Extraocular movements intact.     Conjunctiva/sclera: Conjunctivae normal.     Pupils: Pupils are equal, round, and reactive to light.  Cardiovascular:     Rate and Rhythm: Normal rate.     Pulses: Normal pulses.  Pulmonary:     Effort: Pulmonary effort is normal. No respiratory distress.  Abdominal:     General: There is no distension.     Palpations: Abdomen is soft.  Musculoskeletal:        General: Tenderness present.     Cervical back: Neck supple.     Right lower leg: No edema.      Left lower leg: No edema.     Comments: Patient has good distal strength with no pain over the greater trochanters.  No clonus or focal weakness.  Skin:    General: Skin is warm and dry.     Findings: No erythema, lesion or rash.  Neurological:     General: No focal deficit present.     Mental Status: She is alert and oriented to person, place, and time.     Sensory: No sensory deficit.     Motor: No weakness or abnormal muscle tone.     Coordination: Coordination normal.     Gait: Gait normal.  Psychiatric:        Mood and Affect: Mood normal.        Behavior: Behavior normal.      Imaging: No results found.

## 2023-05-12 NOTE — Procedures (Signed)
S1 Lumbosacral Transforaminal Epidural Steroid Injection - Sub-Pedicular Approach with Fluoroscopic Guidance   Patient: Crystal Koch      Date of Birth: 1980/12/19 MRN: [id number] PCP: Hoy Register, MD      Visit Date: 05/03/2023   Universal Protocol:    Date/Time: 05/11/2409:12 AM  Consent Given By: the patient  Position:  PRONE  Additional Comments: Vital signs were monitored before and after the procedure. Patient was prepped and draped in the usual sterile fashion. The correct patient, procedure, and site was verified.   Injection Procedure Details:  Procedure Site One Meds Administered:  Meds ordered this encounter  Medications   methylPREDNISolone acetate (DEPO-MEDROL) injection 40 mg    Laterality: Bilateral  Location/Site:  S1 Foramen   Needle size: 22 ga.  Needle type: Spinal  Needle Placement: Transforaminal  Findings:   -Comments: Excellent flow of contrast along the nerve, nerve root and into the epidural space.  Epidurogram: Contrast epidurogram showed no nerve root cut off or restricted flow pattern.  Procedure Details: After squaring off the sacral end-plate to get a true AP view, the C-arm was positioned so that the best possible view of the S1 foramen was visualized. The soft tissues overlying this structure were infiltrated with 2-3 ml. of 1% Lidocaine without Epinephrine.    The spinal needle was inserted toward the target using a "trajectory" view along the fluoroscope beam.  Under AP and lateral visualization, the needle was advanced so it did not puncture dura. Biplanar projections were used to confirm position. Aspiration was confirmed to be negative for CSF and/or blood. A 1-2 ml. volume of Isovue-250 was injected and flow of contrast was noted at each level. Radiographs were obtained for documentation purposes.   After attaining the desired flow of contrast documented above, a 0.5 to 1.0 ml test dose of 0.25% Marcaine was injected into  each respective transforaminal space.  The patient was observed for 90 seconds post injection.  After no sensory deficits were reported, and normal lower extremity motor function was noted,   the above injectate was administered so that equal amounts of the injectate were placed at each foramen (level) into the transforaminal epidural space.   Additional Comments:  The patient tolerated the procedure well Dressing: Band-Aid with 2 x 2 sterile gauze    Post-procedure details: Patient was observed during the procedure. Post-procedure instructions were reviewed.  Patient left the clinic in stable condition.

## 2023-05-16 DIAGNOSIS — F419 Anxiety disorder, unspecified: Secondary | ICD-10-CM | POA: Insufficient documentation

## 2023-05-23 ENCOUNTER — Encounter: Payer: Self-pay | Admitting: Physical Medicine and Rehabilitation

## 2023-05-28 ENCOUNTER — Encounter: Payer: Self-pay | Admitting: Physical Medicine and Rehabilitation

## 2023-05-28 ENCOUNTER — Ambulatory Visit (INDEPENDENT_AMBULATORY_CARE_PROVIDER_SITE_OTHER): Payer: Medicaid Other | Admitting: Physical Medicine and Rehabilitation

## 2023-05-28 VITALS — BP 139/80

## 2023-05-28 DIAGNOSIS — M5116 Intervertebral disc disorders with radiculopathy, lumbar region: Secondary | ICD-10-CM

## 2023-05-28 DIAGNOSIS — M5442 Lumbago with sciatica, left side: Secondary | ICD-10-CM | POA: Diagnosis not present

## 2023-05-28 DIAGNOSIS — G8929 Other chronic pain: Secondary | ICD-10-CM

## 2023-05-28 DIAGNOSIS — M5416 Radiculopathy, lumbar region: Secondary | ICD-10-CM

## 2023-05-28 DIAGNOSIS — M5441 Lumbago with sciatica, right side: Secondary | ICD-10-CM | POA: Diagnosis not present

## 2023-05-28 NOTE — Progress Notes (Signed)
2 weeks of constant throbbing pain.  R side

## 2023-05-28 NOTE — Progress Notes (Signed)
Crystal Koch - 42 y.o. female MRN [id number]  Date of birth: 1980/08/25  Office Visit Note: Visit Date: 05/28/2023 PCP: Hoy Register, MD Referred by: Hoy Register, MD  Subjective: Chief Complaint  Patient presents with   Lower Back - Pain   HPI: Crystal Koch is a 42 y.o. female who comes in today for evaluation of chronic, worsening and severe bilateral lower back pain radiating to buttocks and hips, right greater than left. She was previously evaluated by our spine surgeon Dr. Willia Craze for left sided symptoms. She continues to have bilateral pain, however right side is now worse. Pain increases with movement and activity, describes as throbbing sensation, currently rates as 8 out of 10. Some relief of pain with home exercise regimen, rest and use of medications. Some relief of pain with Meloxicam and Flexeril.  No history of dedicated physical therapy for her back. Lumbar MRI imaging from 2023 exhibits left paracentral disc protrusion at L5-S1 impinging on left S1 nerve root. No high grade spinal canal stenosis. Patient recent underwent bilateral S1 transforaminal epidural steroid injection in our office on 05/03/2023, she reports significant relief of left sided symptoms, right sided pain persists. Patient has been evaluated by both Dr. Willia Craze and Dr. Venita Lick. Dr. Christell Constant did mention possible microdiscectomy, however he feels she can continue with injections if these procedures are beneficial in alleviating her pain. She was also evaluated by Dr. Venita Lick with [facility] in August, per his note, no surgical intervention recommended at this time. Patient currently working at [employer] in [employer]. Patient denies focal weakness. No recent trauma or falls.         Review of Systems  Musculoskeletal:  Positive for back pain.  Neurological:  Negative for tingling, sensory change, focal weakness and weakness.  All other systems reviewed and are negative.  Otherwise per  HPI.  Assessment & Plan: Visit Diagnoses:    ICD-10-CM   1. Chronic bilateral low back pain with bilateral sciatica  M54.42 MR LUMBAR SPINE WO CONTRAST   M54.41 Ambulatory referral to Physical Therapy   G89.29     2. Lumbar radiculopathy  M54.16 MR LUMBAR SPINE WO CONTRAST    Ambulatory referral to Physical Therapy    3. Intervertebral disc disorders with radiculopathy, lumbar region  M51.16 MR LUMBAR SPINE WO CONTRAST    Ambulatory referral to Physical Therapy       Plan: Findings:  Chronic, worsening and severe bilateral lower back pain radiating to buttocks and hips, right greater than left. Significant relief of pain with left sided symptoms post bilateral S1 transforaminal epidural steroid injection on 05/03/2023, right sided symptoms remain. Her lumbar MRI imaging from 2023 shows left paracentral disc protrusion at L5-S1, these findings do not directly correlate with her right sided pain. Her pain does not correlate with hip pathology, no right sided groin pain with rotation. I discussed treatment plan in detail today, next step is to obtain new lumbar MRI imaging imaging. Depending on results of imaging we would consider injection, would also consider referral back to Dr. Venita Lick to discuss surgical options. I also placed order for short course of formal physical therapy. She can continue with current medication regimen and remain active as tolerated. I will see her back for lumbar MRI review. No red flag symptoms noted upon exam today.     Meds & Orders: No orders of the defined types were placed in this encounter.   Orders Placed This Encounter  Procedures  MR LUMBAR SPINE WO CONTRAST   Ambulatory referral to Physical Therapy    Follow-up: Return for Lumbar MRI review.   Procedures: No procedures performed      Clinical History: MRI LUMBAR SPINE WITHOUT CONTRAST   TECHNIQUE: Multiplanar, multisequence MR imaging of the lumbar spine was performed. No intravenous  contrast was administered.   COMPARISON:  None Available.   FINDINGS: Segmentation:  5 lumbar type vertebrae   Alignment:  Physiologic.   Vertebrae:  No fracture, evidence of discitis, or bone lesion.   Conus medullaris and cauda equina: Conus extends to the L2 level. Conus and cauda equina appear normal.   Paraspinal and other soft tissues: Negative for perispinal mass or inflammation. 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   Disc levels:   Diffusely preserved disc height and hydration. Left paracentral herniation at L5-S1 which impinges on the left S1 nerve root at the subarticular recess. Negative facets and foramina.   IMPRESSION: L5-S1 left paracentral protrusion impinging on the left S1 nerve root.     Electronically Signed   By: Tiburcio Pea M.D.   On: 03/15/2022 05:31   She reports that she has been smoking cigarettes. She has a 8 pack-year smoking history. She has never used smokeless tobacco. No results for input(s): "HGBA1C", "LABURIC" in the last 8760 hours.  Objective:  VS:  HT:    WT:   BMI:     BP:139/80  HR: bpm  TEMP: ( )  RESP:  Physical Exam Vitals and nursing note reviewed.  HENT:     Head: Normocephalic and atraumatic.     Right Ear: External ear normal.     Left Ear: External ear normal.     Nose: Nose normal.     Mouth/Throat:     Mouth: Mucous membranes are moist.  Eyes:     Extraocular Movements: Extraocular movements intact.  Cardiovascular:     Rate and Rhythm: Normal rate.     Pulses: Normal pulses.  Pulmonary:     Effort: Pulmonary effort is normal.  Abdominal:     General: Abdomen is flat. There is no distension.  Musculoskeletal:        General: Tenderness present.     Cervical back: Normal range of motion.  Skin:    General: Skin is warm and dry.     Capillary Refill: Capillary refill takes less than 2 seconds.  Neurological:     General: No focal deficit present.     Mental  Status: She is alert and oriented to person, place, and time.  Psychiatric:        Mood and Affect: Mood normal.        Behavior: Behavior normal.     Ortho Exam  Imaging: No results found.  Past Medical/Family/Surgical/Social History: Medications & Allergies reviewed per EMR, new medications updated. Patient Active Problem List   Diagnosis Date Noted   Anxiety 05/16/2023   Positive depression screening 04/18/2023   Lumbar radiculopathy 04/18/2023   Acute non-recurrent sinusitis 05/31/2021   Vitamin D deficiency 06/22/2014   Sciatica of left side 06/19/2014   Current smoker 06/19/2014   Past Medical History:  Diagnosis Date   Asthma 1986   Sciatica 2011   since son was born. exacerbated every winter.    Family History  Problem Relation Age of Onset   Cancer Maternal Grandmother    Cancer Father    Thyroid disease Mother    Hypertension Maternal Uncle  Diabetes Neg Hx    Past Surgical History:  Procedure Laterality Date   CESAREAN SECTION  2011   Social History   Occupational History   Occupation: Homemaker   Tobacco Use   Smoking status: Every Day    Current packs/day: 0.50    Average packs/day: 0.5 packs/day for 16.0 years (8.0 ttl pk-yrs)    Types: Cigarettes   Smokeless tobacco: Never  Vaping Use   Vaping status: Never Used  Substance and Sexual Activity   Alcohol use: No   Drug use: Yes    Types: Marijuana   Sexual activity: Yes    Birth control/protection: None

## 2023-05-29 ENCOUNTER — Telehealth: Payer: Medicaid Other | Admitting: Physician Assistant

## 2023-05-29 DIAGNOSIS — G8929 Other chronic pain: Secondary | ICD-10-CM | POA: Diagnosis not present

## 2023-05-29 DIAGNOSIS — M5441 Lumbago with sciatica, right side: Secondary | ICD-10-CM

## 2023-05-29 NOTE — Progress Notes (Signed)
Virtual Visit Consent   Melode Murry, you are scheduled for a virtual visit with a [facility] provider today. Just as with appointments in the office, your consent must be obtained to participate. Your consent will be active for this visit and any virtual visit you may have with one of our providers in the next 365 days. If you have a MyChart account, a copy of this consent can be sent to you electronically.  As this is a virtual visit, video technology does not allow for your provider to perform a traditional examination. This may limit your provider's ability to fully assess your condition. If your provider identifies any concerns that need to be evaluated in person or the need to arrange testing (such as labs, EKG, etc.), we will make arrangements to do so. Although advances in technology are sophisticated, we cannot ensure that it will always work on either your end or our end. If the connection with a video visit is poor, the visit may have to be switched to a telephone visit. With either a video or telephone visit, we are not always able to ensure that we have a secure connection.  By engaging in this virtual visit, you consent to the provision of healthcare and authorize for your insurance to be billed (if applicable) for the services provided during this visit. Depending on your insurance coverage, you may receive a charge related to this service.  I need to obtain your verbal consent now. Are you willing to proceed with your visit today? Crystal Koch has provided verbal consent on 05/29/2023 for a virtual visit (video or telephone). Crystal Loveless, PA-C  Date: 05/29/2023 12:45 PM  Virtual Visit via Video Note   IMargaretann Koch, connected with  Crystal Koch  ([id number], Dec 22, 1980) on 05/29/23 at 12:45 PM EST by a video-enabled telemedicine application and verified that I am speaking with the correct person using two identifiers.  Location: Patient: Virtual Visit Location  Patient: Home Provider: Virtual Visit Location Provider: Home Office   I discussed the limitations of evaluation and management by telemedicine and the availability of in person appointments. The patient expressed understanding and agreed to proceed.    History of Present Illness: Crystal Koch is a 42 y.o. who identifies as a female who was assigned female at birth, and is being seen today for back pain.  HPI: Back Pain This is a chronic problem. The current episode started more than 1 month ago. The problem occurs constantly. The problem is unchanged. The pain is present in the lumbar spine (right side). The quality of the pain is described as aching, shooting and stabbing. The pain radiates to the right thigh and right knee. The pain is severe. The symptoms are aggravated by lying down. Associated symptoms include leg pain and weakness. Pertinent negatives include no bladder incontinence, bowel incontinence, fever, numbness, paresis, paresthesias or perianal numbness. She has tried NSAIDs, muscle relaxant and heat for the symptoms. The treatment provided no relief.  Saw [facility] yesterday. Scheduled for an MRI.  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   Problems:  Patient Active Problem List   Diagnosis Date Noted   Anxiety 05/16/2023   Positive depression screening 04/18/2023   Lumbar radiculopathy 04/18/2023   Acute non-recurrent sinusitis 05/31/2021   Vitamin D deficiency 06/22/2014   Sciatica of left side 06/19/2014   Current smoker 06/19/2014    Allergies: No Known Allergies Medications:  Current Outpatient Medications:  chlorhexidine (PERIDEX) 0.12 % solution, RINSE, GARGLE AND SPIT 15 ML BY MOUTH OR THROAT TWICE DAILY AS DIRECTED, Disp: 946 mL, Rfl: 0   cyclobenzaprine (FLEXERIL) 5 MG tablet, Take 1 tablet (5 mg total) by mouth 3 (three) times daily as needed for muscle spasms., Disp: 30 tablet, Rfl: 0   fluconazole (DIFLUCAN) 150 MG tablet,  Take 1 tablet (150 mg total) by mouth every 3 (three) days as needed., Disp: 2 tablet, Rfl: 0   FLUoxetine (PROZAC) 20 MG capsule, Take 1 capsule (20 mg total) by mouth daily., Disp: 90 capsule, Rfl: 1   fluticasone (FLONASE) 50 MCG/ACT nasal spray, Place 2 sprays into both nostrils daily., Disp: 16 g, Rfl: 6   gabapentin (NEURONTIN) 100 MG capsule, TAKE 1 CAPSULE(100 MG) BY MOUTH THREE TIMES DAILY, Disp: 90 capsule, Rfl: 0   hydrOXYzine (VISTARIL) 25 MG capsule, Take 1 capsule (25 mg total) by mouth every 8 (eight) hours as needed. For severe anxiety, Disp: 40 capsule, Rfl: 1   meloxicam (MOBIC) 15 MG tablet, Take 1 tablet (15 mg total) by mouth daily., Disp: 30 tablet, Rfl: 3   metroNIDAZOLE (FLAGYL) 500 MG tablet, Take 1 tablet (500 mg total) by mouth 2 (two) times daily., Disp: 14 tablet, Rfl: 0   omeprazole (PRILOSEC) 40 MG capsule, Take 1 capsule (40 mg total) by mouth daily. (Patient not taking: Reported on 04/18/2023), Disp: 90 capsule, Rfl: 1   predniSONE (STERAPRED UNI-PAK 21 TAB) 10 MG (21) TBPK tablet, 6 day taper; take as directed on package instructions, Disp: 21 tablet, Rfl: 0  Observations/Objective: Patient is well-developed, well-nourished in no acute distress.  Resting comfortably at home.  Head is normocephalic, atraumatic.  No labored breathing.  Speech is clear and coherent with logical content.  Patient is alert and oriented at baseline.    Assessment and Plan: 1. Chronic right-sided low back pain with right-sided sciatica (Primary)  - Acute flare from Ortho visit yesterday; Certain movements for evaluation flared pain - Has Flexeril, Meloxicam that she is using - Flexeril makes her drowsy, requiring work note for today - Will keep scheduled appts for imaging and Orthopedics  Follow Up Instructions: I discussed the assessment and treatment plan with the patient. The patient was provided an opportunity to ask questions and all were answered. The patient agreed with  the plan and demonstrated an understanding of the instructions.  A copy of instructions were sent to the patient via MyChart unless otherwise noted below.    The patient was advised to call back or seek an in-person evaluation if the symptoms worsen or if the condition fails to improve as anticipated.    Crystal Loveless, PA-C

## 2023-05-29 NOTE — Patient Instructions (Signed)
[name], thank you for joining [name], PA-C for today's virtual visit.  While this provider is not your primary care provider (PCP), if your PCP is located in our provider database this encounter information will be shared with them immediately following your visit.   A [facility] MyChart account gives you access to today's visit and all your visits, tests, and labs performed at [facility] " click here if you don't have a [facility] MyChart account or go to [email or web address]  Consent: (Patient) Crystal Koch provided verbal consent for this virtual visit at the beginning of the encounter.  Current Medications:  Current Outpatient Medications:    chlorhexidine (PERIDEX) 0.12 % solution, RINSE, GARGLE AND SPIT 15 ML BY MOUTH OR THROAT TWICE DAILY AS DIRECTED, Disp: 946 mL, Rfl: 0   cyclobenzaprine (FLEXERIL) 5 MG tablet, Take 1 tablet (5 mg total) by mouth 3 (three) times daily as needed for muscle spasms., Disp: 30 tablet, Rfl: 0   fluconazole (DIFLUCAN) 150 MG tablet, Take 1 tablet (150 mg total) by mouth every 3 (three) days as needed., Disp: 2 tablet, Rfl: 0   FLUoxetine (PROZAC) 20 MG capsule, Take 1 capsule (20 mg total) by mouth daily., Disp: 90 capsule, Rfl: 1   fluticasone (FLONASE) 50 MCG/ACT nasal spray, Place 2 sprays into both nostrils daily., Disp: 16 g, Rfl: 6   gabapentin (NEURONTIN) 100 MG capsule, TAKE 1 CAPSULE(100 MG) BY MOUTH THREE TIMES DAILY, Disp: 90 capsule, Rfl: 0   hydrOXYzine (VISTARIL) 25 MG capsule, Take 1 capsule (25 mg total) by mouth every 8 (eight) hours as needed. For severe anxiety, Disp: 40 capsule, Rfl: 1   meloxicam (MOBIC) 15 MG tablet, Take 1 tablet (15 mg total) by mouth daily., Disp: 30 tablet, Rfl: 3   metroNIDAZOLE (FLAGYL) 500 MG tablet, Take 1 tablet (500 mg total) by mouth 2 (two) times daily., Disp: 14 tablet, Rfl: 0   omeprazole (PRILOSEC) 40 MG capsule, Take 1 capsule (40 mg total) by mouth daily. (Patient  not taking: Reported on 04/18/2023), Disp: 90 capsule, Rfl: 1   Medications ordered in this encounter:  No orders of the defined types were placed in this encounter.    *If you need refills on other medications prior to your next appointment, please contact your pharmacy*  Follow-Up: Call back or seek an in-person evaluation if the symptoms worsen or if the condition fails to improve as anticipated.  [facility] [phone]  Other Instructions Back Exercises The following exercises strengthen the muscles that help to support the trunk (torso) and back. They also help to keep the lower back flexible. Doing these exercises can help to prevent or lessen existing low back pain. If you have back pain or discomfort, try doing these exercises 2-3 times each day or as told by your health care provider. As your pain improves, do them once each day, but increase the number of times that you repeat the steps for each exercise (do more repetitions). To prevent the recurrence of back pain, continue to do these exercises once each day or as told by your health care provider. Do exercises exactly as told by your health care provider and adjust them as directed. It is normal to feel mild stretching, pulling, tightness, or discomfort as you do these exercises, but you should stop right away if you feel sudden pain or your pain gets worse. Exercises Single knee to chest Repeat these steps 3-5 times for each leg: Lie on  your back on a firm bed or the floor with your legs extended. Bring one knee to your chest. Your other leg should stay extended and in contact with the floor. Hold your knee in place by grabbing your knee or thigh with both hands and hold. Pull on your knee until you feel a gentle stretch in your lower back or buttocks. Hold the stretch for 10-30 seconds. Slowly release and straighten your leg.  Pelvic tilt Repeat these steps 5-10 times: Lie on your back on a firm bed or  the floor with your legs extended. Bend your knees so they are pointing toward the ceiling and your feet are flat on the floor. Tighten your lower abdominal muscles to press your lower back against the floor. This motion will tilt your pelvis so your tailbone points up toward the ceiling instead of pointing to your feet or the floor. With gentle tension and even breathing, hold this position for 5-10 seconds.  Cat-cow Repeat these steps until your lower back becomes more flexible: Get into a hands-and-knees position on a firm bed or the floor. Keep your hands under your shoulders, and keep your knees under your hips. You may place padding under your knees for comfort. Let your head hang down toward your chest. Contract your abdominal muscles and point your tailbone toward the floor so your lower back becomes rounded like the back of a cat. Hold this position for 5 seconds. Slowly lift your head, let your abdominal muscles relax, and point your tailbone up toward the ceiling so your back forms a sagging arch like the back of a cow. Hold this position for 5 seconds.  Press-ups Repeat these steps 5-10 times: Lie on your abdomen (face-down) on a firm bed or the floor. Place your palms near your head, about shoulder-width apart. Keeping your back as relaxed as possible and keeping your hips on the floor, slowly straighten your arms to raise the top half of your body and lift your shoulders. Do not use your back muscles to raise your upper torso. You may adjust the placement of your hands to make yourself more comfortable. Hold this position for 5 seconds while you keep your back relaxed. Slowly return to lying flat on the floor.  Bridges Repeat these steps 10 times: Lie on your back on a firm bed or the floor. Bend your knees so they are pointing toward the ceiling and your feet are flat on the floor. Your arms should be flat at your sides, next to your body. Tighten your buttocks muscles and  lift your buttocks off the floor until your waist is at almost the same height as your knees. You should feel the muscles working in your buttocks and the back of your thighs. If you do not feel these muscles, slide your feet 1-2 inches (2.5-5 cm) farther away from your buttocks. Hold this position for 3-5 seconds. Slowly lower your hips to the starting position, and allow your buttocks muscles to relax completely. If this exercise is too easy, try doing it with your arms crossed over your chest. Abdominal crunches Repeat these steps 5-10 times: Lie on your back on a firm bed or the floor with your legs extended. Bend your knees so they are pointing toward the ceiling and your feet are flat on the floor. Cross your arms over your chest. Tip your chin slightly toward your chest without bending your neck. Tighten your abdominal muscles and slowly raise your torso high enough to lift  your shoulder blades a tiny bit off the floor. Avoid raising your torso higher than that because it can put too much stress on your lower back and does not help to strengthen your abdominal muscles. Slowly return to your starting position.  Back lifts Repeat these steps 5-10 times: Lie on your abdomen (face-down) with your arms at your sides, and rest your forehead on the floor. Tighten the muscles in your legs and your buttocks. Slowly lift your chest off the floor while you keep your hips pressed to the floor. Keep the back of your head in line with the curve in your back. Your eyes should be looking at the floor. Hold this position for 3-5 seconds. Slowly return to your starting position.  Contact a health care provider if: Your back pain or discomfort gets much worse when you do an exercise. Your worsening back pain or discomfort does not lessen within 2 hours after you exercise. If you have any of these problems, stop doing these exercises right away. Do not do them again unless your health care provider says  that you can. Get help right away if: You develop sudden, severe back pain. If this happens, stop doing the exercises right away. Do not do them again unless your health care provider says that you can. This information is not intended to replace advice given to you by your health care provider. Make sure you discuss any questions you have with your health care provider. Document Revised: 07/02/2022 Document Reviewed: 08/11/2020 Elsevier Patient Education  2024 Elsevier Inc.    If you have been instructed to have an in-person evaluation today at a local Urgent Care facility, please use the link below. It will take you to a list of all of our available [facility], including address, phone number and hours of operation. Please do not delay care.  [facility]  If you or a family member do not have a primary care provider, use the link below to schedule a visit and establish care. When you choose a [facility] primary care physician or advanced practice provider, you gain a long-term partner in health. Find a Primary Care Provider  Learn more about [facility]'s in-office and virtual care options: [facility] - Get Care Now

## 2023-06-04 ENCOUNTER — Encounter: Payer: Medicaid Other | Admitting: Licensed Clinical Social Worker

## 2023-06-07 ENCOUNTER — Other Ambulatory Visit: Payer: Self-pay

## 2023-06-08 ENCOUNTER — Other Ambulatory Visit: Payer: Self-pay

## 2023-06-18 ENCOUNTER — Telehealth: Payer: Self-pay | Admitting: Family Medicine

## 2023-06-18 ENCOUNTER — Encounter: Payer: Medicaid Other | Admitting: Licensed Clinical Social Worker

## 2023-06-18 ENCOUNTER — Ambulatory Visit: Payer: Medicaid Other | Admitting: Physical Therapy

## 2023-06-18 NOTE — Telephone Encounter (Addendum)
 Called patient to reschedule mychart VV appointment due to providers internet not functioning, patient was upset about this.  An appointment for Wednesday 1/8 was offered but patient declined to reschedule and is requesting a call back. Provider was informed about this.

## 2023-06-19 ENCOUNTER — Telehealth: Payer: Self-pay | Admitting: Licensed Clinical Social Worker

## 2023-06-19 NOTE — Telephone Encounter (Signed)
 RECEIVED and pt contacted

## 2023-06-19 NOTE — Telephone Encounter (Signed)
 LCSWA called pt to discuss her appt from yesterday pt shared that she was going to the ER to assist her fiance because he was ill. Pt seemed frustrated hung up. But before hanging up she stated that she was fine and would call me later.

## 2023-06-21 ENCOUNTER — Ambulatory Visit
Admission: RE | Admit: 2023-06-21 | Discharge: 2023-06-21 | Disposition: A | Discharge status: Home / Self Care | Payer: Medicaid Other | Source: Ambulatory Visit | Attending: Physical Medicine and Rehabilitation | Admitting: Physical Medicine and Rehabilitation

## 2023-06-21 DIAGNOSIS — G8929 Other chronic pain: Secondary | ICD-10-CM

## 2023-06-21 DIAGNOSIS — M5416 Radiculopathy, lumbar region: Secondary | ICD-10-CM

## 2023-06-21 DIAGNOSIS — M5127 Other intervertebral disc displacement, lumbosacral region: Secondary | ICD-10-CM | POA: Diagnosis not present

## 2023-06-21 DIAGNOSIS — M5116 Intervertebral disc disorders with radiculopathy, lumbar region: Secondary | ICD-10-CM

## 2023-06-25 ENCOUNTER — Encounter: Payer: Self-pay | Admitting: Family Medicine

## 2023-06-25 ENCOUNTER — Ambulatory Visit: Payer: Medicaid Other | Attending: Family Medicine | Admitting: Family Medicine

## 2023-06-25 ENCOUNTER — Other Ambulatory Visit (HOSPITAL_COMMUNITY)
Admission: RE | Admit: 2023-06-25 | Discharge: 2023-06-25 | Disposition: A | Discharge status: Home / Self Care | Payer: Medicaid Other | Source: Ambulatory Visit | Attending: Family Medicine | Admitting: Family Medicine

## 2023-06-25 VITALS — BP 145/88 | HR 98 | Ht 62.0 in | Wt 157.6 lb

## 2023-06-25 DIAGNOSIS — M51369 Other intervertebral disc degeneration, lumbar region without mention of lumbar back pain or lower extremity pain: Secondary | ICD-10-CM | POA: Diagnosis not present

## 2023-06-25 DIAGNOSIS — H9201 Otalgia, right ear: Secondary | ICD-10-CM

## 2023-06-25 DIAGNOSIS — K219 Gastro-esophageal reflux disease without esophagitis: Secondary | ICD-10-CM | POA: Diagnosis not present

## 2023-06-25 DIAGNOSIS — F17200 Nicotine dependence, unspecified, uncomplicated: Secondary | ICD-10-CM | POA: Diagnosis not present

## 2023-06-25 DIAGNOSIS — Z113 Encounter for screening for infections with a predominantly sexual mode of transmission: Secondary | ICD-10-CM | POA: Diagnosis not present

## 2023-06-25 DIAGNOSIS — Z13228 Encounter for screening for other metabolic disorders: Secondary | ICD-10-CM | POA: Diagnosis not present

## 2023-06-25 MED ORDER — BUPROPION HCL ER (XL) 150 MG PO TB24: 150.0000 mg | ORAL_TABLET | Freq: Every day | ORAL | 1 refills | Status: DC

## 2023-06-25 MED ORDER — MELOXICAM 15 MG PO TABS: 15.0000 mg | ORAL_TABLET | Freq: Every day | ORAL | 3 refills | Status: DC

## 2023-06-25 MED ORDER — FLUOXETINE HCL 40 MG PO CAPS: 40.0000 mg | ORAL_CAPSULE | Freq: Every day | ORAL | 1 refills | Status: DC

## 2023-06-25 MED ORDER — OMEPRAZOLE 40 MG PO CPDR: 40.0000 mg | DELAYED_RELEASE_CAPSULE | Freq: Every day | ORAL | 1 refills | Status: DC

## 2023-06-25 NOTE — Progress Notes (Signed)
 Subjective:  Patient ID: Crystal Koch, female    DOB: 06-30-1980  Age: 43 y.o. MRN: [id number]  CC: Medical Management of Chronic Issues (Right ear discomfort/Hydroxyzine  not working/)   HPI Crystal Koch is a 43 y.o. year old female with a history of lumbar radiculopathy, anxiety, nicotine dependence here for an office visit.   Interval History: Discussed the use of AI scribe software for clinical note transcription with the patient, who gave verbal consent to proceed.   The patient, with a history of anxiety, chronic low back pain, and recurrent bacterial vaginosis, presents with concerns about her current anxiety medication, Hydroxyzine  (Vistaril ). She reports that the medication is not alleviating her anxiety and may be causing more harm than good.She has previously had success with Xanax, but this was used as needed, not daily. She is currently also on Prozac , which she feels is working.  In addition to her anxiety, the patient reports a sensation in her right ear when she blows her nose, which has been occurring for the past two days. She describes it as hearing fluid in her ear, like a pop, which is irritating but not painful. She also reports waking up with a lot of mucus that she needs to clear.  The patient also requests a vaginal swab due to a history of recurrent bacterial vaginosis. She is currently asymptomatic but wants to ensure that the previous medication was effective.  The patient also mentions chronic low back pain, for which she has been seeing a specialist and has recently had an MRI back injections.  She expresses a desire for a referral to a chiropractor that accepts Medicaid.  Blood pressure is slightly elevated and she has no known history of hypertension. Lastly, the patient expresses a desire to quit smoking. She currently smokes five to six cigarettes a day and acknowledges it as a bad habit she would like to break. She is open to assistance in the form of patches  or pills.        Past Medical History:  Diagnosis Date   Asthma 1986   Sciatica 2011   since son was born. exacerbated every winter.     Past Surgical History:  Procedure Laterality Date   CESAREAN SECTION  2011    Family History  Problem Relation Age of Onset   Cancer Maternal Grandmother    Cancer Father    Thyroid disease Mother    Hypertension Maternal Uncle    Diabetes Neg Hx     Social History   Socioeconomic History   Marital status: Single    Spouse name: Not on file   Number of children: 1   Years of education: college    Highest education level: Associate degree: occupational, scientist, product/process development, or vocational program  Occupational History   Occupation: Homemaker   Tobacco Use   Smoking status: Every Day    Current packs/day: 0.50    Average packs/day: 0.5 packs/day for 16.0 years (8.0 ttl pk-yrs)    Types: Cigarettes   Smokeless tobacco: Never  Vaping Use   Vaping status: Never Used  Substance and Sexual Activity   Alcohol use: No   Drug use: Yes    Types: Marijuana   Sexual activity: Yes    Birth control/protection: None  Other Topics Concern   Not on file  Social History Narrative   Lives at home with 4 yo son.    Social Drivers of Health   Financial Resource Strain: High Risk (06/24/2023)   Overall Financial  Resource Strain (CARDIA)    Difficulty of Paying Living Expenses: Very hard  Food Insecurity: Food Insecurity Present (06/24/2023)   Hunger Vital Sign    Worried About Running Out of Food in the Last Year: Often true    Ran Out of Food in the Last Year: Often true  Transportation Needs: Unmet Transportation Needs (06/24/2023)   PRAPARE - Administrator, Civil Service (Medical): Yes    Lack of Transportation (Non-Medical): Yes  Physical Activity: Sufficiently Active (06/24/2023)   Exercise Vital Sign    Days of Exercise per Week: 4 days    Minutes of Exercise per Session: 150+ min  Stress: Stress Concern Present (06/24/2023)    [employer] of Occupational Health - Occupational Stress Questionnaire    Feeling of Stress : To some extent  Social Connections: Socially Isolated (06/24/2023)   Social Connection and Isolation Panel [NHANES]    Frequency of Communication with Friends and Family: More than three times a week    Frequency of Social Gatherings with Friends and Family: Once a week    Attends Religious Services: Never    Database Administrator or Organizations: No    Attends Engineer, Structural: Not on file    Marital Status: Separated    No Known Allergies  Outpatient Medications Prior to Visit  Medication Sig Dispense Refill   chlorhexidine  (PERIDEX ) 0.12 % solution RINSE, GARGLE AND SPIT 15 ML BY MOUTH OR THROAT TWICE DAILY AS DIRECTED 946 mL 0   cyclobenzaprine  (FLEXERIL ) 5 MG tablet Take 1 tablet (5 mg total) by mouth 3 (three) times daily as needed for muscle spasms. 30 tablet 0   fluticasone  (FLONASE ) 50 MCG/ACT nasal spray Place 2 sprays into both nostrils daily. 16 g 6   gabapentin  (NEURONTIN ) 100 MG capsule TAKE 1 CAPSULE(100 MG) BY MOUTH THREE TIMES DAILY 90 capsule 0   FLUoxetine  (PROZAC ) 20 MG capsule Take 1 capsule (20 mg total) by mouth daily. 90 capsule 1   hydrOXYzine  (VISTARIL ) 25 MG capsule Take 1 capsule (25 mg total) by mouth every 8 (eight) hours as needed. For severe anxiety 40 capsule 1   meloxicam  (MOBIC ) 15 MG tablet Take 1 tablet (15 mg total) by mouth daily. 30 tablet 3   omeprazole  (PRILOSEC) 40 MG capsule Take 1 capsule (40 mg total) by mouth daily. 90 capsule 1   fluconazole  (DIFLUCAN ) 150 MG tablet Take 1 tablet (150 mg total) by mouth every 3 (three) days as needed. (Patient not taking: Reported on 06/25/2023) 2 tablet 0   metroNIDAZOLE  (FLAGYL ) 500 MG tablet Take 1 tablet (500 mg total) by mouth 2 (two) times daily. (Patient not taking: Reported on 06/25/2023) 14 tablet 0   No facility-administered medications prior to visit.     ROS Review of Systems   Constitutional:  Negative for activity change and appetite change.  HENT:  Positive for ear pain. Negative for sinus pressure and sore throat.   Respiratory:  Negative for chest tightness, shortness of breath and wheezing.   Cardiovascular:  Negative for chest pain and palpitations.  Gastrointestinal:  Negative for abdominal distention, abdominal pain and constipation.  Genitourinary: Negative.   Musculoskeletal:  Positive for back pain.  Psychiatric/Behavioral:  Negative for behavioral problems and dysphoric mood.     Objective:  BP (!) 145/88   Pulse 98   Ht 5' 2 (1.575 m)   Wt 157 lb 9.6 oz (71.5 kg)   SpO2 99%   BMI 28.83 kg/m  06/25/2023   10:14 AM 05/28/2023    9:27 AM 04/18/2023    2:49 PM  BP/Weight  Systolic BP 145 139 142  Diastolic BP 88 80 92  Wt. (Lbs) 157.6  148  BMI 28.83 kg/m2  27.07 kg/m2      Physical Exam Constitutional:      Appearance: She is well-developed.  Cardiovascular:     Rate and Rhythm: Normal rate.     Heart sounds: Normal heart sounds. No murmur heard. Pulmonary:     Effort: Pulmonary effort is normal.     Breath sounds: Normal breath sounds. No wheezing or rales.  Chest:     Chest wall: No tenderness.  Abdominal:     General: Bowel sounds are normal. There is no distension.     Palpations: Abdomen is soft. There is no mass.     Tenderness: There is no abdominal tenderness.  Musculoskeletal:        General: Normal range of motion.     Right lower leg: No edema.     Left lower leg: No edema.  Neurological:     Mental Status: She is alert and oriented to person, place, and time.  Psychiatric:        Mood and Affect: Mood normal.        Latest Ref Rng & Units 12/21/2022   11:29 AM 03/02/2022   10:15 AM 08/17/2021   11:45 AM  CMP  Glucose 70 - 99 mg/dL 75  75  80   BUN 6 - 24 mg/dL 11  14  10    Creatinine 0.57 - 1.00 mg/dL 9.32  9.25  9.16   Sodium 134 - 144 mmol/L 138  141  140   Potassium 3.5 - 5.2 mmol/L 4.2  4.0   4.0   Chloride 96 - 106 mmol/L 104  104  101   CO2 20 - 29 mmol/L 22  23  25    Calcium  8.7 - 10.2 mg/dL 9.2  9.0  89.9   Total Protein 6.0 - 8.5 g/dL 6.5   6.9   Total Bilirubin 0.0 - 1.2 mg/dL 0.3   0.3   Alkaline Phos 44 - 121 IU/L 65   68   AST 0 - 40 IU/L 11   18   ALT 0 - 32 IU/L 11   18     Lipid Panel     Component Value Date/Time   CHOL 198 06/01/2022 1150   TRIG 68 06/01/2022 1150   HDL 42 06/01/2022 1150   CHOLHDL 5.0 (H) 03/24/2021 1059   LDLCALC 144 (H) 06/01/2022 1150    CBC    Component Value Date/Time   WBC 8.4 03/02/2022 1015   WBC 6.7 06/19/2014 0954   RBC 5.15 03/02/2022 1015   RBC 4.55 06/19/2014 0954   HGB 14.3 03/02/2022 1015   HCT 44.1 03/02/2022 1015   PLT 379 03/02/2022 1015   MCV 86 03/02/2022 1015   MCH 27.8 03/02/2022 1015   MCH 28.8 06/19/2014 0954   MCHC 32.4 03/02/2022 1015   MCHC 33.8 06/19/2014 0954   RDW 13.4 03/02/2022 1015   LYMPHSABS 3.3 (H) 03/02/2022 1015   EOSABS 0.2 03/02/2022 1015   BASOSABS 0.1 03/02/2022 1015    Lab Results  Component Value Date   HGBA1C 5.8 (H) 03/02/2022    Assessment & Plan:      Anxiety   Patient reports hydroxyzine  (Vistaril ) is not effective. Currently on Prozac  20mg  daily, which is also used for anxiety  management.   -Increase Prozac  to 40mg  daily.   -Discontinue hydroxyzine .    Smoking Cessation   Patient reports smoking 5-6 cigarettes daily and expresses desire to quit.   -Start Wellbutrin  to help reduce cravings for cigarettes.    Chronic Low Back Pain   Patient reports ongoing pain, despite injections.   -Referral to a chiropractor who accepts Medicaid.    Sinus Symptoms   Patient reports congestion and hearing fluid in right ear when blowing nose. Ear examination normal.   -Recommend over-the-counter allergy medications such as Zyrtec, Loratadine, or Allegra to decrease pressure in ear canal.    Screening for STDs Patient reports history of recurrent bacterial vaginosis (BV) but  is currently asymptomatic.   -Perform vaginal swab to check for BV, yeast, and STDs.    Medication Review   Patient confirms taking omeprazole  for acid reflux, meloxicam , and gabapentin  for chronic low back pain, and cyclobenzaprine  (Flexeril ).   -Continue current medications.   -Remove antibiotics (metronidazole , Diflucan ) from medication list as she is not currently being taken.    General Health Maintenance   -Order labs for cholesterol, kidney function, and liver function.   -Schedule follow-up visit in six months.          Meds ordered this encounter  Medications   FLUoxetine  (PROZAC ) 40 MG capsule    Sig: Take 1 capsule (40 mg total) by mouth daily.    Dispense:  90 capsule    Refill:  1    Dose increase   meloxicam  (MOBIC ) 15 MG tablet    Sig: Take 1 tablet (15 mg total) by mouth daily.    Dispense:  30 tablet    Refill:  3   omeprazole  (PRILOSEC) 40 MG capsule    Sig: Take 1 capsule (40 mg total) by mouth daily.    Dispense:  90 capsule    Refill:  1   buPROPion  (WELLBUTRIN  XL) 150 MG 24 hr tablet    Sig: Take 1 tablet (150 mg total) by mouth daily. For smoking cessation    Dispense:  90 tablet    Refill:  1    Follow-up: Return in about 1 month (around 07/26/2023) for Blood Pressure follow-up with PCP.       Corrina Sabin, MD, FAAFP. [facility] [place], [place] [phone]   06/25/2023, 12:55 PM

## 2023-06-25 NOTE — Patient Instructions (Signed)
 VISIT SUMMARY:  During today's visit, we discussed your concerns about your current anxiety medication, sinus symptoms, chronic low back pain, smoking cessation, and vaginal health. We reviewed your medications and made some adjustments to better manage your conditions.  YOUR PLAN:  -ANXIETY: Anxiety is a condition characterized by feelings of worry, nervousness, or fear that are strong enough to interfere with one's daily activities. We will increase your Prozac  dosage to 40mg  daily and discontinue Hydroxyzine  as it is not effective for you.  -SMOKING CESSATION: Smoking cessation is the process of discontinuing tobacco smoking. To help you quit smoking, we will start you on Wellbutrin , which can help reduce cravings for cigarettes.  -CHRONIC LOW BACK PAIN: Chronic low back pain is persistent pain in the lower back area. We will refer you to a chiropractor who accepts Medicaid to help manage your pain.  -SINUS SYMPTOMS: Sinus symptoms can include congestion and a sensation of fluid in the ear. We recommend over-the-counter allergy medications such as Zyrtec, Loratadine, or Allegra to help decrease the pressure in your ear canal.  -VAGINAL HEALTH: Recurrent bacterial vaginosis (BV) is a condition where there is an overgrowth of bacteria in the vagina. Although you are currently asymptomatic, we will perform a vaginal swab to check for BV, yeast, and STDs.  -ELEVATED BLOOD PRESSURE: Your blood pressure is elevated today and was high on repeat.  I would recommend you work on lifestyle modifications, avoid smoking and avoid drinking coffee the morning of your next appointment so we can reassess your blood pressure in 1 month.  Exercises a low-sodium diet will be beneficial.  -MEDICATION REVIEW: We reviewed your current medications and confirmed that you should continue taking omeprazole , meloxicam , gabapentin , and cyclobenzaprine . We have removed antibiotics (metronidazole , Diflucan ) from your  medication list as you are not currently taking them.  -GENERAL HEALTH MAINTENANCE: We will order routine labs to check your cholesterol, kidney function, and liver function. It is important to monitor these to ensure your overall health.  INSTRUCTIONS:  Please follow up in six months for a routine check-up. If you experience any new symptoms or have concerns before then, do not hesitate to contact our office.

## 2023-06-26 LAB — CMP14+EGFR
ALT: 22 [IU]/L (ref 0–32)
AST: 19 [IU]/L (ref 0–40)
Albumin: 4.6 g/dL (ref 3.9–4.9)
Alkaline Phosphatase: 79 [IU]/L (ref 44–121)
BUN/Creatinine Ratio: 17 (ref 9–23)
BUN: 11 mg/dL (ref 6–24)
Bilirubin Total: 0.2 mg/dL (ref 0.0–1.2)
CO2: 25 mmol/L (ref 20–29)
Calcium: 9.4 mg/dL (ref 8.7–10.2)
Chloride: 100 mmol/L (ref 96–106)
Creatinine, Ser: 0.66 mg/dL (ref 0.57–1.00)
Globulin, Total: 2.6 g/dL (ref 1.5–4.5)
Glucose: 78 mg/dL (ref 70–99)
Potassium: 4.4 mmol/L (ref 3.5–5.2)
Sodium: 138 mmol/L (ref 134–144)
Total Protein: 7.2 g/dL (ref 6.0–8.5)
eGFR: 112 mL/min/{1.73_m2} (ref >59)

## 2023-06-26 LAB — LP+NON-HDL CHOLESTEROL
Cholesterol, Total: 228 mg/dL — ABNORMAL HIGH (ref 100–199)
HDL: 55 mg/dL (ref >39)
LDL Chol Calc (NIH): 161 mg/dL — ABNORMAL HIGH (ref 0–99)
Total Non-HDL-Chol (LDL+VLDL): 173 mg/dL — ABNORMAL HIGH (ref 0–129)
Triglycerides: 70 mg/dL (ref 0–149)
VLDL Cholesterol Cal: 12 mg/dL (ref 5–40)

## 2023-06-26 LAB — CBC WITH DIFFERENTIAL/PLATELET
Basophils Absolute: 0 10*3/uL (ref 0.0–0.2)
Basos: 1 %
EOS (ABSOLUTE): 0.1 10*3/uL (ref 0.0–0.4)
Eos: 1 %
Hematocrit: 47.2 % — ABNORMAL HIGH (ref 34.0–46.6)
Hemoglobin: 15.3 g/dL (ref 11.1–15.9)
Immature Grans (Abs): 0 10*3/uL (ref 0.0–0.1)
Immature Granulocytes: 0 %
Lymphocytes Absolute: 3 10*3/uL (ref 0.7–3.1)
Lymphs: 38 %
MCH: 28.5 pg (ref 26.6–33.0)
MCHC: 32.4 g/dL (ref 31.5–35.7)
MCV: 88 fL (ref 79–97)
Monocytes Absolute: 0.6 10*3/uL (ref 0.1–0.9)
Monocytes: 7 %
Neutrophils Absolute: 4.1 10*3/uL (ref 1.4–7.0)
Neutrophils: 53 %
Platelets: 364 10*3/uL (ref 150–450)
RBC: 5.37 x10E6/uL — ABNORMAL HIGH (ref 3.77–5.28)
RDW: 12.6 % (ref 11.7–15.4)
WBC: 7.8 10*3/uL (ref 3.4–10.8)

## 2023-06-26 LAB — CERVICOVAGINAL ANCILLARY ONLY
Bacterial Vaginitis (gardnerella): POSITIVE — AB
Candida Glabrata: NEGATIVE
Candida Vaginitis: NEGATIVE
Chlamydia: NEGATIVE
Comment: NEGATIVE
Comment: NEGATIVE
Comment: NEGATIVE
Comment: NEGATIVE
Comment: NEGATIVE
Comment: NORMAL
Neisseria Gonorrhea: NEGATIVE
Trichomonas: NEGATIVE

## 2023-06-27 ENCOUNTER — Other Ambulatory Visit: Payer: Self-pay | Admitting: Family Medicine

## 2023-06-27 ENCOUNTER — Other Ambulatory Visit: Payer: Self-pay

## 2023-06-27 ENCOUNTER — Encounter: Payer: Self-pay | Admitting: Family Medicine

## 2023-06-27 MED ORDER — METRONIDAZOLE 500 MG PO TABS
500.0000 mg | ORAL_TABLET | Freq: Two times a day (BID) | ORAL | 0 refills | Status: AC
  Filled 2023-06-27: qty 14, 7d supply, fill #0

## 2023-06-27 MED ORDER — ATORVASTATIN CALCIUM 20 MG PO TABS
20.0000 mg | ORAL_TABLET | Freq: Every day | ORAL | 1 refills | Status: DC
  Filled 2023-06-27: qty 90, 90d supply, fill #0
  Filled 2023-10-29: qty 90, 90d supply, fill #1

## 2023-06-28 ENCOUNTER — Other Ambulatory Visit: Payer: Self-pay

## 2023-06-28 ENCOUNTER — Other Ambulatory Visit: Payer: Self-pay | Admitting: Family Medicine

## 2023-06-28 MED ORDER — AZO BORIC ACID 600 MG VA SUPP
600.0000 mg | Freq: Every day | VAGINAL | 0 refills | Status: DC
  Filled 2023-06-28 – 2023-06-29 (×2): qty 30, fill #0

## 2023-06-29 ENCOUNTER — Other Ambulatory Visit: Payer: Self-pay

## 2023-07-02 DIAGNOSIS — M9903 Segmental and somatic dysfunction of lumbar region: Secondary | ICD-10-CM | POA: Diagnosis not present

## 2023-07-02 DIAGNOSIS — M5386 Other specified dorsopathies, lumbar region: Secondary | ICD-10-CM | POA: Diagnosis not present

## 2023-07-02 DIAGNOSIS — M9905 Segmental and somatic dysfunction of pelvic region: Secondary | ICD-10-CM | POA: Diagnosis not present

## 2023-07-02 DIAGNOSIS — M9904 Segmental and somatic dysfunction of sacral region: Secondary | ICD-10-CM | POA: Diagnosis not present

## 2023-07-04 ENCOUNTER — Other Ambulatory Visit: Payer: Self-pay

## 2023-07-04 DIAGNOSIS — M5386 Other specified dorsopathies, lumbar region: Secondary | ICD-10-CM | POA: Diagnosis not present

## 2023-07-04 DIAGNOSIS — M9903 Segmental and somatic dysfunction of lumbar region: Secondary | ICD-10-CM | POA: Diagnosis not present

## 2023-07-04 DIAGNOSIS — M9904 Segmental and somatic dysfunction of sacral region: Secondary | ICD-10-CM | POA: Diagnosis not present

## 2023-07-04 DIAGNOSIS — M9905 Segmental and somatic dysfunction of pelvic region: Secondary | ICD-10-CM | POA: Diagnosis not present

## 2023-07-08 ENCOUNTER — Telehealth: Payer: Medicaid Other | Admitting: Family Medicine

## 2023-07-08 DIAGNOSIS — K047 Periapical abscess without sinus: Secondary | ICD-10-CM | POA: Diagnosis not present

## 2023-07-08 MED ORDER — PENICILLIN V POTASSIUM 500 MG PO TABS: 500.0000 mg | ORAL_TABLET | Freq: Three times a day (TID) | ORAL | 0 refills | Status: AC

## 2023-07-08 NOTE — Patient Instructions (Signed)
Dental Abscess  A dental abscess is an infection around a tooth that may involve pain, swelling, and a collection of pus, as well as other symptoms. Treatment is important to help with symptoms and to prevent the infection from spreading. The general types of dental abscesses are: Pulpal abscess. This abscess may form from the inner part of the tooth (pulp). Periodontal abscess. This abscess may form from the gum. What are the causes? This condition is caused by a bacterial infection in or around the tooth. It may result from: Severe tooth decay (cavities). Trauma to the tooth, such as a broken or chipped tooth. What increases the risk? This condition is more likely to develop in males. It is also more likely to develop in people who: Have cavities. Have severe gum disease. Eat sugary snacks between meals. Use tobacco products. Have diabetes. Have a weakened disease-fighting system (immune system). Do not brush and care for their teeth regularly. What are the signs or symptoms? Mild symptoms of this condition include: Tenderness. Bad breath. Fever. A bitter taste in the mouth. Pain in and around the infected tooth. Moderate symptoms of this condition include: Swollen neck glands. Chills. Pus drainage. Swelling and redness around the infected tooth, in the mouth, or in the face. Severe pain in and around the infected tooth. Severe symptoms of this condition include: Difficulty swallowing. Difficulty opening the mouth. Nausea. Vomiting. How is this diagnosed? This condition is diagnosed based on: Your symptoms and your medical and dental history. An examination of the infected tooth. During the exam, your dental care provider may tap on the infected tooth. You may also need to have X-rays taken of the affected area. How is this treated? This condition is treated by getting rid of the infection. This may be done with: Antibiotic medicines. These may be used in certain  situations. Antibacterial mouth rinse. Incision and drainage. This procedure is done by making an incision in the abscess to drain out the pus. Removing pus is the first priority in treating an abscess. A root canal. This may be performed to save the tooth. Your dental care provider accesses the visible part of your tooth (crown) with a drill and removes any infected pulp. Then the space is filled and sealed off. Tooth extraction. The tooth is pulled out if it cannot be saved by other treatment. You may also receive treatment for pain, such as: Acetaminophen or NSAIDs. Gels that contain a numbing medicine. An injection to block the pain near your nerve. Follow these instructions at home: Medicines Take over-the-counter and prescription medicines only as told by your dental care provider. If you were prescribed an antibiotic, take it as told by your dental care provider. Do not stop taking the antibiotic even if you start to feel better. If you were prescribed a gel that contains a numbing medicine, use it exactly as told in the directions. Do not use these gels for children who are younger than 98 years of age. Use an antibacterial mouth rinse as told by your dental care provider. General instructions  Gargle with a mixture of salt and water 3-4 times a day or as needed. To make salt water, completely dissolve -1 tsp (3-6 g) of salt in 1 cup (237 mL) of warm water. Eat a soft diet while your abscess is healing. Drink enough fluid to keep your urine pale yellow. Do not apply heat to the outside of your mouth. Do not use any products that contain nicotine or tobacco. These  products include cigarettes, chewing tobacco, and vaping devices, such as e-cigarettes. If you need help quitting, ask your dental care provider. Keep all follow-up visits. This is important. How is this prevented?  Excellent dental home care, which includes brushing your teeth every morning and night with fluoride  toothpaste. Floss one time each day. Get regularly scheduled dental cleanings. Consider having a dental sealant applied on teeth that have deep grooves to prevent cavities. Drink fluoridated water regularly. This includes most tap water. Check the label on bottled water to see if it contains fluoride. Reduce or eliminate sugary drinks. Eat healthy meals and snacks. Wear a mouth guard or face shield to protect your teeth while playing sports. Contact a health care provider if: Your pain is worse and is not helped by medicine. You have swelling. You see pus around the tooth. You have a fever or chills. Get help right away if: Your symptoms suddenly get worse. You have a very bad headache. You have problems breathing or swallowing. You have trouble opening your mouth. You have swelling in your neck or around your eye. These symptoms may represent a serious problem that is an emergency. Do not wait to see if the symptoms will go away. Get medical help right away. Call your local emergency services (911 in the [place] Do not drive yourself to the hospital. Summary A dental abscess is a collection of pus in or around a tooth that results from an infection. A dental abscess may result from severe tooth decay, trauma to the tooth, or severe gum disease around a tooth. Symptoms include severe pain, swelling, redness, and drainage of pus in and around the infected tooth. The first priority in treating a dental abscess is to drain out the pus. Treatment may also involve removing damage inside the tooth (root canal) or extracting the tooth. This information is not intended to replace advice given to you by your health care provider. Make sure you discuss any questions you have with your health care provider. Document Revised: 08/05/2020 Document Reviewed: 08/05/2020 Elsevier Patient Education  2024 [employer].

## 2023-07-08 NOTE — Progress Notes (Signed)
Virtual Visit Consent   Crystal Koch, you are scheduled for a virtual visit with a [facility] provider today. Just as with appointments in the office, your consent must be obtained to participate. Your consent will be active for this visit and any virtual visit you may have with one of our providers in the next 365 days. If you have a MyChart account, a copy of this consent can be sent to you electronically.  As this is a virtual visit, video technology does not allow for your provider to perform a traditional examination. This may limit your provider's ability to fully assess your condition. If your provider identifies any concerns that need to be evaluated in person or the need to arrange testing (such as labs, EKG, etc.), we will make arrangements to do so. Although advances in technology are sophisticated, we cannot ensure that it will always work on either your end or our end. If the connection with a video visit is poor, the visit may have to be switched to a telephone visit. With either a video or telephone visit, we are not always able to ensure that we have a secure connection.  By engaging in this virtual visit, you consent to the provision of healthcare and authorize for your insurance to be billed (if applicable) for the services provided during this visit. Depending on your insurance coverage, you may receive a charge related to this service.  I need to obtain your verbal consent now. Are you willing to proceed with your visit today? Crystal Koch has provided verbal consent on 07/08/2023 for a virtual visit (video or telephone). Georgana Curio, FNP  Date: 07/08/2023 12:13 PM  Virtual Visit via Video Note   I, Georgana Curio, connected with  Crystal Koch  ([id number], [birth year]) on 07/08/23 at 12:15 PM EST by a video-enabled telemedicine application and verified that I am speaking with the correct person using two identifiers.  Location: Patient: Virtual Visit Location Patient: Home Provider:  Virtual Visit Location Provider: Home Office   I discussed the limitations of evaluation and management by telemedicine and the availability of in person appointments. The patient expressed understanding and agreed to proceed.    History of Present Illness: Crystal Koch is a 43 y.o. who identifies as a female who was assigned female at birth, and is being seen today for dental abscess rt upper tooth with facial swelling under rt nostril and into rt upper gum line. No fever. Marland Kitchen  HPI: HPI  Problems:  Patient Active Problem List   Diagnosis Date Noted   Anxiety 05/16/2023   Positive depression screening 04/18/2023   Lumbar radiculopathy 04/18/2023   Acute non-recurrent sinusitis 05/31/2021   Vitamin D deficiency 06/22/2014   Sciatica of left side 06/19/2014   Current smoker 06/19/2014    Allergies: No Known Allergies Medications:  Current Outpatient Medications:    penicillin v potassium (VEETID) 500 MG tablet, Take 1 tablet (500 mg total) by mouth 3 (three) times daily for 10 days., Disp: 30 tablet, Rfl: 0   atorvastatin (LIPITOR) 20 MG tablet, Take 1 tablet (20 mg total) by mouth daily., Disp: 90 tablet, Rfl: 1   Boric Acid Vaginal (AZO BORIC ACID) 600 MG SUPP, Place 600 mg vaginally at bedtime., Disp: 30 suppository, Rfl: 0   buPROPion (WELLBUTRIN XL) 150 MG 24 hr tablet, Take 1 tablet (150 mg total) by mouth daily. For smoking cessation, Disp: 90 tablet, Rfl: 1   chlorhexidine (PERIDEX) 0.12 % solution, RINSE, GARGLE AND SPIT 15 ML  BY MOUTH OR THROAT TWICE DAILY AS DIRECTED, Disp: 946 mL, Rfl: 0   cyclobenzaprine (FLEXERIL) 5 MG tablet, Take 1 tablet (5 mg total) by mouth 3 (three) times daily as needed for muscle spasms., Disp: 30 tablet, Rfl: 0   FLUoxetine (PROZAC) 40 MG capsule, Take 1 capsule (40 mg total) by mouth daily., Disp: 90 capsule, Rfl: 1   fluticasone (FLONASE) 50 MCG/ACT nasal spray, Place 2 sprays into both nostrils daily., Disp: 16 g, Rfl: 6   gabapentin (NEURONTIN)  100 MG capsule, TAKE 1 CAPSULE(100 MG) BY MOUTH THREE TIMES DAILY, Disp: 90 capsule, Rfl: 0   meloxicam (MOBIC) 15 MG tablet, Take 1 tablet (15 mg total) by mouth daily., Disp: 30 tablet, Rfl: 3   omeprazole (PRILOSEC) 40 MG capsule, Take 1 capsule (40 mg total) by mouth daily., Disp: 90 capsule, Rfl: 1  Observations/Objective: Patient is well-developed, well-nourished in no acute distress.  Resting comfortably  at home.  Head is normocephalic, atraumatic.  No labored breathing.  Speech is clear and coherent with logical content.  Patient is alert and oriented at baseline.    Assessment and Plan: 1. Dental abscess (Primary)  Warm salt water rinses, make apptmt with dentist.   Follow Up Instructions: I discussed the assessment and treatment plan with the patient. The patient was provided an opportunity to ask questions and all were answered. The patient agreed with the plan and demonstrated an understanding of the instructions.  A copy of instructions were sent to the patient via MyChart unless otherwise noted below.     The patient was advised to call back or seek an in-person evaluation if the symptoms worsen or if the condition fails to improve as anticipated.    Georgana Curio, FNP

## 2023-07-09 ENCOUNTER — Other Ambulatory Visit: Payer: Self-pay

## 2023-07-09 ENCOUNTER — Ambulatory Visit: Payer: Medicaid Other | Admitting: Physical Medicine and Rehabilitation

## 2023-07-09 ENCOUNTER — Encounter: Payer: Self-pay | Admitting: Physical Medicine and Rehabilitation

## 2023-07-09 DIAGNOSIS — M9903 Segmental and somatic dysfunction of lumbar region: Secondary | ICD-10-CM | POA: Diagnosis not present

## 2023-07-09 DIAGNOSIS — M5386 Other specified dorsopathies, lumbar region: Secondary | ICD-10-CM | POA: Diagnosis not present

## 2023-07-09 DIAGNOSIS — M5442 Lumbago with sciatica, left side: Secondary | ICD-10-CM

## 2023-07-09 DIAGNOSIS — M9904 Segmental and somatic dysfunction of sacral region: Secondary | ICD-10-CM | POA: Diagnosis not present

## 2023-07-09 DIAGNOSIS — G8929 Other chronic pain: Secondary | ICD-10-CM

## 2023-07-09 DIAGNOSIS — M5116 Intervertebral disc disorders with radiculopathy, lumbar region: Secondary | ICD-10-CM | POA: Diagnosis not present

## 2023-07-09 DIAGNOSIS — M5416 Radiculopathy, lumbar region: Secondary | ICD-10-CM

## 2023-07-09 DIAGNOSIS — M9905 Segmental and somatic dysfunction of pelvic region: Secondary | ICD-10-CM | POA: Diagnosis not present

## 2023-07-09 NOTE — Progress Notes (Unsigned)
Crystal Koch - 43 y.o. female MRN [id number]  Date of birth: February 10, 1981  Office Visit Note: Visit Date: 07/09/2023 PCP: Hoy Register, MD Referred by: Hoy Register, MD  Subjective: Chief Complaint  Patient presents with   Lower Back - Pain   HPI: Crystal Koch is a 43 y.o. female who comes in today for evaluation of chronic bilateral lower back pain radiating to buttocks and hips, right greater than left. Pain ongoing for several years, worsens with movement and activity. She describes pain as sore and throbbing sensation, currently denies pain. Some relief of pain with home exercise regimen, rest and use of medications. She recently started chiropractic treatments with Dr. Alvan Dame, she reports significant relief of pain with these therapies. Recent lumbar MRI imaging exhibits disc protrusion at the level of L5-S1 as seen on prior study with potential compression of traversing left S1 nerve root. No high grade spinal canal stenosis noted. Patient underwent bilateral S1 transforaminal epidural steroid injection in our office on 05/03/2023. She reports complete resolution of left sided symptoms, right sided pain was much better for about 2 weeks following injection. She was previously evaluated by both Dr. Willia Craze and Dr. Venita Lick. Dr. Christell Constant did mention possible microdiscectomy, however he feels she can continue with injections if these procedures are beneficial in alleviating her pain. She was also evaluated by Dr. Venita Lick with [facility] in August, per his note, no surgical intervention recommended at this time. Patient continues to work full time at [employer] in [employer]. Patient denies focal weakness, numbness and tingling. No recent trauma or falls.       Review of Systems  Musculoskeletal:  Negative for back pain.  Neurological:  Negative for tingling, sensory change, focal weakness and weakness.  All other systems reviewed and are negative.  Otherwise per  HPI.  Assessment & Plan: Visit Diagnoses:    ICD-10-CM   1. Chronic bilateral low back pain with bilateral sciatica  M54.42    M54.41    G89.29     2. Lumbar radiculopathy  M54.16     3. Intervertebral disc disorders with radiculopathy, lumbar region  M51.16        Plan: Findings:  Chronic bilateral lower back pain radiating to buttocks and hips, right greater than left.  Significant and sustained relief of pain with recent chiropractic treatments.  Patient continues with conservative therapy such as home exercise regimen, rest and use of medications.  There is a paracentral disc protrusion at the level of L5-S1 possibly causing compression of left S1 nerve root. I do feel this is a chronic pain generator for her, otherwise recent lumbar MRI looks fairly normal. I explained to patient that referral patterns of pain can differ, it would not be uncommon for her to have pain on the right side as well. Overall, she is doing well at this time. Should her pain return would consider performing interlaminar approach at the level of L5-S1. Could also refer her back to Dr. Shon Baton to discuss further surgical interventions. I encouraged patient to continue with chiropractic treatments and to remain active as tolerated. I encouraged her to let us know if her pain increases or changes in nature. She has no questions at this time. No red flag symptoms noted upon exam today.     Meds & Orders: No orders of the defined types were placed in this encounter.  No orders of the defined types were placed in this encounter.   Follow-up: Return if symptoms worsen  or fail to improve.   Procedures: No procedures performed      Clinical History: Narrative & Impression CLINICAL DATA:  Low back pain, symptoms persist with > 6 wks treatment   EXAM: MRI LUMBAR SPINE WITHOUT CONTRAST   TECHNIQUE: Multiplanar, multisequence MR imaging of the lumbar spine was performed. No intravenous contrast was administered.    COMPARISON:  September 2023   FINDINGS: Segmentation:  Normal.   Alignment:  Stable and without listhesis.   Vertebrae: Stable and preserved vertebral body heights. No marrow edema. No suspicious lesion.   Conus medullaris and cauda equina: Conus extends to the L1-L2 level. Conus and cauda equina appear normal.   Paraspinal and other soft tissues: No new finding. Fibroid at the fundus of the uterus.   Disc levels: Intervertebral disc heights and signal are maintained. Persistent mild disc bulge at L5-S1 with superimposed left central/subarticular protrusion. No canal or left foraminal stenosis. Minor right foraminal stenosis. Narrowing of the left subarticular recess with potential compression of the traversing left S1 nerve root.   IMPRESSION: Disc protrusion at L5-S1 as seen on the prior study with potential compression of traversing left S1 nerve root.     Electronically Signed   By: Guadlupe Spanish M.D.   On: 07/01/2023 15:56   She reports that she has been smoking cigarettes. She has a 8 pack-year smoking history. She has never used smokeless tobacco. No results for input(s): "HGBA1C", "LABURIC" in the last 8760 hours.  Objective:  VS:  HT:    WT:   BMI:     BP:   HR: bpm  TEMP: ( )  RESP:  Physical Exam Vitals and nursing note reviewed.  HENT:     Head: Normocephalic and atraumatic.     Right Ear: External ear normal.     Left Ear: External ear normal.     Nose: Nose normal.     Mouth/Throat:     Mouth: Mucous membranes are moist.  Eyes:     Extraocular Movements: Extraocular movements intact.  Cardiovascular:     Rate and Rhythm: Normal rate.     Pulses: Normal pulses.  Pulmonary:     Effort: Pulmonary effort is normal.  Abdominal:     General: Abdomen is flat. There is no distension.  Musculoskeletal:        General: No tenderness.     Cervical back: Normal range of motion.     Comments: Patient rises from seated position to standing without  difficulty. Good lumbar range of motion. No pain noted with facet loading. 5/5 strength noted with bilateral hip flexion, knee flexion/extension, ankle dorsiflexion/plantarflexion and EHL. No clonus noted bilaterally. No pain upon palpation of greater trochanters. No pain with internal/external rotation of bilateral hips. Sensation intact bilaterally. Negative slump test bilaterally. Ambulates without aid, gait steady.     Skin:    General: Skin is warm.     Capillary Refill: Capillary refill takes less than 2 seconds.  Neurological:     General: No focal deficit present.     Mental Status: She is alert and oriented to person, place, and time.  Psychiatric:        Mood and Affect: Mood normal.        Behavior: Behavior normal.     Ortho Exam  Imaging: No results found.  Past Medical/Family/Surgical/Social History: Medications & Allergies reviewed per EMR, new medications updated. Patient Active Problem List   Diagnosis Date Noted   Anxiety 05/16/2023   Positive  depression screening 04/18/2023   Lumbar radiculopathy 04/18/2023   Acute non-recurrent sinusitis 05/31/2021   Vitamin D deficiency 06/22/2014   Sciatica of left side 06/19/2014   Current smoker 06/19/2014   Past Medical History:  Diagnosis Date   Asthma 1986   Sciatica 2011   since son was born. exacerbated every winter.    Family History  Problem Relation Age of Onset   Cancer Maternal Grandmother    Cancer Father    Thyroid disease Mother    Hypertension Maternal Uncle    Diabetes Neg Hx    Past Surgical History:  Procedure Laterality Date   CESAREAN SECTION  2011   Social History   Occupational History   Occupation: Homemaker   Tobacco Use   Smoking status: Every Day    Current packs/day: 0.50    Average packs/day: 0.5 packs/day for 16.0 years (8.0 ttl pk-yrs)    Types: Cigarettes   Smokeless tobacco: Never  Vaping Use   Vaping status: Never Used  Substance and Sexual Activity   Alcohol use: No    Drug use: Yes    Types: Marijuana   Sexual activity: Yes    Birth control/protection: None

## 2023-07-11 DIAGNOSIS — M9903 Segmental and somatic dysfunction of lumbar region: Secondary | ICD-10-CM | POA: Diagnosis not present

## 2023-07-11 DIAGNOSIS — M5386 Other specified dorsopathies, lumbar region: Secondary | ICD-10-CM | POA: Diagnosis not present

## 2023-07-11 DIAGNOSIS — M9905 Segmental and somatic dysfunction of pelvic region: Secondary | ICD-10-CM | POA: Diagnosis not present

## 2023-07-11 DIAGNOSIS — M9904 Segmental and somatic dysfunction of sacral region: Secondary | ICD-10-CM | POA: Diagnosis not present

## 2023-07-12 ENCOUNTER — Other Ambulatory Visit (HOSPITAL_COMMUNITY): Payer: Self-pay

## 2023-07-12 ENCOUNTER — Other Ambulatory Visit: Payer: Self-pay

## 2023-07-12 ENCOUNTER — Encounter: Payer: Self-pay | Admitting: Podiatry

## 2023-07-12 ENCOUNTER — Ambulatory Visit (INDEPENDENT_AMBULATORY_CARE_PROVIDER_SITE_OTHER): Payer: Medicaid Other

## 2023-07-12 ENCOUNTER — Ambulatory Visit (INDEPENDENT_AMBULATORY_CARE_PROVIDER_SITE_OTHER): Payer: Medicaid Other | Admitting: Podiatry

## 2023-07-12 DIAGNOSIS — M722 Plantar fascial fibromatosis: Secondary | ICD-10-CM

## 2023-07-12 DIAGNOSIS — M5432 Sciatica, left side: Secondary | ICD-10-CM | POA: Diagnosis not present

## 2023-07-12 MED ORDER — PREDNISONE 10 MG PO TABS: ORAL_TABLET | ORAL | 0 refills | Status: AC

## 2023-07-12 NOTE — Progress Notes (Addendum)
Chief Complaint  Patient presents with   Plantar Fasciitis    PF left foot is still aggravated and swollen. It throbs on a constant basis. Been wearing the boot "all the time" but the pain come back when she trys to walk with out the boot. Not diabetic No anti coags.     HPI: 43 y.o. female presents today for follow-up evaluation of bilateral.  Fasciitis.  She states that she been doing well for a while after last follow-up.  She has had some recurrence, left greater than right.  Left side can be 10/10 pain, right side generally 5-6/10 pain.  She has been wearing better shoes and using power step inserts.  Of note she also does report chronic low back pain with sciatica left greater than right with shooting pain from her hip all the way down to her foot.  Past Medical History:  Diagnosis Date   Asthma 1986   Sciatica 2011   since son was born. exacerbated every winter.     Past Surgical History:  Procedure Laterality Date   CESAREAN SECTION  2011    No Known Allergies     Physical Exam: There were no vitals filed for this visit.  General: The patient is alert and oriented x3 in no acute distress.  Dermatology: Skin is warm, dry and supple bilateral lower extremities. Interspaces are clear of maceration and debris.    Vascular: Palpable pedal pulses bilaterally. Capillary refill within normal limits.  No appreciable edema.  No erythema or calor.  Neurological: Light touch sensation grossly intact bilateral feet.  Burning, pins and needle sensation noted with percussion of tibial nerve left foot  Musculoskeletal Exam: Pain on palpation of plantar medial calcaneal tubercle left greater than right.  No gaps in plantar fascia.  Positive windlass mechanism.  Pes planus foot type appreciated.  Some localized inflammation appreciated.Marland Kitchen  No pain on palpation of the posterior Achilles or posterior calcaneus.  No significant equinus appreciated.  Radiographs: Left foot 3  weightbearing views 07/12/2023 Joint spaces preserved.  Normal osseous mineralization.  No acute osseous abnormalities or acute fractures noted.  No significant heel spur appreciated.  Pes planus foot type.  Assessment/Plan of Care: 1. Plantar fasciitis, left   2. Sciatica of left side      Meds ordered this encounter  Medications   predniSONE (DELTASONE) 10 MG tablet    Sig: Take 6 tablets (60 mg total) by mouth daily with breakfast for 2 days, THEN 5 tablets (50 mg total) daily with breakfast for 2 days, THEN 4 tablets (40 mg total) daily with breakfast for 2 days, THEN 3 tablets (30 mg total) daily with breakfast for 2 days, THEN 2 tablets (20 mg total) daily with breakfast for 2 days, THEN 1 tablet (10 mg total) daily with breakfast for 2 days. 12 Day tapering Dose. Take in AM with breakfast.    Dispense:  42 tablet    Refill:  0   MR FOOT LEFT WO CONTRAST  Discussed clinical findings with patient today.  # Bilateral plantar fasciitis Left greater than right - Unfortunately patient is dealing with some recurrence. -Left foot radiographs reviewed with the patient -Deferring injections at this time as she had several rounds previously prior to seeing me as well last year -Cam boot immobilization starting today for left foot.  This was dispensed today.  Recommend use of an even up device.  Advised patient that she can wean out of this  if he notices increased back pain -Also discussed with patient that I believe her recurrence is likely complicated with low back pain and sciatica.  This could also be responsible for some tarsal tunnel like symptoms. Advised her to see back specialist. -MRI ordered left foot and ankle to evaluate extent of plantar fasciitis and tarsal tunnel -Continue RICE therapy, return to use of night splint, return to Achilles and plantar fascial stretching exercises -Starting patient on 12-day prednisone taper -Follow-up in approximately 4 weeks for MRI results. May  consider PT at this time, patient states she would like to defer surgery if possible.   Detrick Dani L. Marchia Bond, AACFAS [facility]     [address] [place], [place] [address]                Office [phone]  Fax [phone]

## 2023-07-12 NOTE — Patient Instructions (Signed)

## 2023-07-19 ENCOUNTER — Ambulatory Visit: Payer: Medicaid Other | Admitting: Podiatry

## 2023-07-30 ENCOUNTER — Ambulatory Visit
Admission: RE | Admit: 2023-07-30 | Discharge: 2023-07-30 | Disposition: A | Discharge status: Home / Self Care | Payer: Medicaid Other | Source: Ambulatory Visit | Attending: Podiatry

## 2023-07-30 DIAGNOSIS — M79672 Pain in left foot: Secondary | ICD-10-CM | POA: Diagnosis not present

## 2023-07-30 DIAGNOSIS — M722 Plantar fascial fibromatosis: Secondary | ICD-10-CM

## 2023-08-08 ENCOUNTER — Telehealth: Payer: Self-pay | Admitting: Licensed Clinical Social Worker

## 2023-08-08 NOTE — Telephone Encounter (Signed)
 Called patient to schedule an appointment due to patient email and therapist wanted to schedule an appointment however patient did not answer and her voicemail was not set up therapist we will email patient back

## 2023-08-09 ENCOUNTER — Encounter: Payer: Self-pay | Admitting: Podiatry

## 2023-08-09 ENCOUNTER — Ambulatory Visit (INDEPENDENT_AMBULATORY_CARE_PROVIDER_SITE_OTHER): Payer: Medicaid Other | Admitting: Podiatry

## 2023-08-09 DIAGNOSIS — M722 Plantar fascial fibromatosis: Secondary | ICD-10-CM | POA: Diagnosis not present

## 2023-08-09 MED ORDER — TRIAMCINOLONE ACETONIDE 10 MG/ML IJ SUSP
5.0000 mg | Freq: Once | INTRAMUSCULAR | Status: AC
  Administered 2023-08-09: 5 mg

## 2023-08-09 NOTE — Progress Notes (Unsigned)
 Chief Complaint  Patient presents with   Plantar Fasciitis    Patient states that she is still having pain even with the boot    HPI: 43 y.o. female presents for follow-up left foot plantar fasciitis.  There is some concern for concurrent sciatica pain.  She reports minimal to no improvement using boot.  She does state that she walks a lot and that she does not have a car so she is on her feet constantly.  It has been difficult for her to rest.  Past Medical History:  Diagnosis Date   Asthma 1986   Sciatica 2011   since son was born. exacerbated every winter.     Past Surgical History:  Procedure Laterality Date   CESAREAN SECTION  2011    No Known Allergies     Physical Exam: There were no vitals filed for this visit.  General: The patient is alert and oriented x3 in no acute distress.  Dermatology: Skin is warm, dry and supple bilateral lower extremities. Interspaces are clear of maceration and debris.    Vascular: Palpable pedal pulses bilaterally. Capillary refill within normal limits.  No appreciable edema.  No erythema or calor.  Neurological: Light touch sensation grossly intact bilateral feet.  Burning, pins and needle sensation noted with percussion of tibial nerve left foot  Musculoskeletal Exam: Pain on palpation of plantar medial calcaneal tubercle left greater than right.  No gaps in plantar fascia.  Positive windlass mechanism.  Pes planus foot type appreciated. Localized inflammation appreciated.Marland Kitchen  No pain on palpation of the posterior Achilles or posterior calcaneus.  No significant equinus appreciated.  MRI left foot I personally reviewed the MRI findings there is increased signal intensity on STIR sagittal images at plantar fascial origin.  Some localized thickness here.  There is some signal intensity of the flexor digitorum brevis muscle belly as well  Assessment/Plan of Care: 1. Plantar fasciitis, left      Meds ordered this encounter   Medications   triamcinolone acetonide (KENALOG) 10 MG/ML injection 5 mg   None  Discussed clinical findings with patient today.  # Left foot plantar fasciitis -Right affected to a degree as well -Patient reports that she has been unable to rest.  Boot has not been helpful. -MRI findings discussed with patient -Verbal consent to administer corticosteroid injection to left plantar fascia.  Alcohol skin prep.  Plantar medial approach.  Injected 0.5 cc of half percent 0.5 Marcaine plain, 0.5 cc of 1% lidocaine plain, 1 cc of Kenalog 10.  Band-Aid applied.  Patient tolerated this well. -May take over-the-counter NSAIDs -Referral to physical therapy placed -Patient wishes to avoid surgery. -Emphasized need to try and rest.  Continue with stretching exercises.  Boot has been not helpful, she can try to return to shoes using over-the-counter power step inserts. -Did discuss that symptoms of shooting pains going into thigh are likely related to sciatica or other process as well. -Follow-up in 2 to 3 weeks to see how she is doing in the interim.  Basya Casavant L. Marchia Bond, AACFAS [facility]     [address], [place] [address]  Office [phone]  Fax [phone]

## 2023-08-15 NOTE — Telephone Encounter (Signed)
 A user error has taken place: encounter opened in error, closed for administrative reasons.

## 2023-08-16 ENCOUNTER — Telehealth: Payer: Self-pay

## 2023-08-16 NOTE — Telephone Encounter (Signed)
 Copied from CRM [id number]. Topic: Appointments - Scheduling Inquiry for Clinic >> Aug 16, 2023  2:58 PM Tiffany S wrote: Reason for CRM: Patient is calling to setup behavior health appt with Reginia Naas. Patient requesting call back

## 2023-08-21 ENCOUNTER — Ambulatory Visit: Payer: Self-pay | Admitting: Family Medicine

## 2023-08-21 DIAGNOSIS — F419 Anxiety disorder, unspecified: Secondary | ICD-10-CM

## 2023-08-21 NOTE — Addendum Note (Signed)
 Addended by: Hoy Register on: 08/21/2023 06:41 PM   Modules accepted: Orders

## 2023-08-21 NOTE — Telephone Encounter (Signed)
 Patient is needing BH referral placed.

## 2023-08-21 NOTE — Telephone Encounter (Signed)
 Patient called in stating she received an email today that her therapist with [facility] (Dr. Margo Aye) is leaving for an extended period of time. Patient is wanting to know who she can be referred to for therapy appointments and would like return call asap. Patient has been attempting to contact Dr. Margo Aye and get a scheduled therapy appt for a few months now with no availability. Patient is needing information from PCP on how to move forward with referral to new therapist asap. Patient states therapist also has her on Prozac and Hydroxyzine and she may need refills of those soon.  Copied from CRM [id number]. Topic: Clinical - Red Word Triage >> Aug 21, 2023  1:06 PM Fuller Mandril wrote: Red Word that prompted transfer to Nurse Triage: Depression and anxiety current symptoms. Unable to get appt with provider until July - Needs to speak with someone now for wants going on. Message sent. Additional Information  Commented on: All Negative - See Physician Within 24 Hours    Reach out to PCP - about Dr. Margo Aye  Answer Assessment - Initial Assessment Questions 1. CONCERN: "Did anything happen that prompted you to call today?"      Patient got an email stating her therapist was leaving and wants to make sure she can get in with someone else 3. ONSET: "How long have you been feeling this way?" (e.g., hours, days, weeks)     Ongoing  4. SEVERITY: "How would you rate the level of anxiety?" (e.g., 0 - 10; or mild, moderate, severe).     8 5. FUNCTIONAL IMPAIRMENT: "How have these feelings affected your ability to do daily activities?" "Have you had more difficulty than usual doing your normal daily activities?" (e.g., getting better, same, worse; self-care, school, work, interactions)     No 6. HISTORY: "Have you felt this way before?" "Have you ever been diagnosed with an anxiety problem in the past?" (e.g., generalized anxiety disorder, panic attacks, PTSD). If Yes, ask: "How was this problem  treated?" (e.g., medicines, counseling, etc.)     Yes 7. RISK OF HARM - SUICIDAL IDEATION: "Do you ever have thoughts of hurting or killing yourself?" If Yes, ask:  "Do you have these feelings now?" "Do you have a plan on how you would do this?"     No 8. TREATMENT:  "What has been done so far to treat this anxiety?" (e.g., medicines, relaxation strategies). "What has helped?"     Prozac, Hydroxizine  9. TREATMENT - THERAPIST: "Do you have a counselor or therapist? Name?"     Dr Margo Aye  11. PATIENT SUPPORT: "Who is with you now?" "Who do you live with?" "Do you have family or friends who you can talk to?"        Yes  12. OTHER SYMPTOMS: "Do you have any other symptoms?" (e.g., feeling depressed, trouble concentrating, trouble sleeping, trouble breathing, palpitations or fast heartbeat, chest pain, sweating, nausea, or diarrhea)       Heightened anxiety  Protocols used: Anxiety and Panic Attack-A-AH

## 2023-08-21 NOTE — Telephone Encounter (Signed)
 Referral has been placed.

## 2023-08-22 ENCOUNTER — Telehealth: Admitting: Nurse Practitioner

## 2023-08-22 DIAGNOSIS — K047 Periapical abscess without sinus: Secondary | ICD-10-CM

## 2023-08-22 MED ORDER — PENICILLIN V POTASSIUM 500 MG PO TABS: 500.0000 mg | ORAL_TABLET | Freq: Three times a day (TID) | ORAL | 0 refills | Status: AC

## 2023-08-22 MED ORDER — CHLORHEXIDINE GLUCONATE 0.12 % MT SOLN: 15.0000 mL | Freq: Two times a day (BID) | OROMUCOSAL | 0 refills | Status: DC | PRN

## 2023-08-22 NOTE — Progress Notes (Signed)
 Virtual Visit Consent   Crystal Koch, you are scheduled for a virtual visit with a [facility] provider today. Just as with appointments in the office, your consent must be obtained to participate. Your consent will be active for this visit and any virtual visit you may have with one of our providers in the next 365 days. If you have a MyChart account, a copy of this consent can be sent to you electronically.  As this is a virtual visit, video technology does not allow for your provider to perform a traditional examination. This may limit your provider's ability to fully assess your condition. If your provider identifies any concerns that need to be evaluated in person or the need to arrange testing (such as labs, EKG, etc.), we will make arrangements to do so. Although advances in technology are sophisticated, we cannot ensure that it will always work on either your end or our end. If the connection with a video visit is poor, the visit may have to be switched to a telephone visit. With either a video or telephone visit, we are not always able to ensure that we have a secure connection.  By engaging in this virtual visit, you consent to the provision of healthcare and authorize for your insurance to be billed (if applicable) for the services provided during this visit. Depending on your insurance coverage, you may receive a charge related to this service.  I need to obtain your verbal consent now. Are you willing to proceed with your visit today? Abeeha Twist has provided verbal consent on 08/22/2023 for a virtual visit (video or telephone). Viviano Simas, FNP  Date: 08/22/2023 5:35 PM   Virtual Visit via Video Note   I, Viviano Simas, connected with  Crystal Koch  ([id number], 02/13/1981) on 08/22/23 at  5:45 PM EDT by a video-enabled telemedicine application and verified that I am speaking with the correct person using two identifiers.  Location: Patient: Virtual Visit Location Patient:  Home Provider: Virtual Visit Location Provider: Home Office   I discussed the limitations of evaluation and management by telemedicine and the availability of in person appointments. The patient expressed understanding and agreed to proceed.    History of Present Illness: Crystal Koch is a 43 y.o. who identifies as a female who was assigned female at birth, and is being seen today for an abscess in her gums  This has occurred in the past in the same spot   This has resolved with antibiotics in the past  The tooth under the area has a chipped tooth that previously had a cap that has now fallen off   She denies a fever    Problems:  Patient Active Problem List   Diagnosis Date Noted   Anxiety 05/16/2023   Positive depression screening 04/18/2023   Lumbar radiculopathy 04/18/2023   Acute non-recurrent sinusitis 05/31/2021   Vitamin D deficiency 06/22/2014   Sciatica of left side 06/19/2014   Current smoker 06/19/2014    Allergies: No Known Allergies Medications:  Current Outpatient Medications:    atorvastatin (LIPITOR) 20 MG tablet, Take 1 tablet (20 mg total) by mouth daily., Disp: 90 tablet, Rfl: 1   Boric Acid Vaginal (AZO BORIC ACID) 600 MG SUPP, Place 600 mg vaginally at bedtime., Disp: 30 suppository, Rfl: 0   buPROPion (WELLBUTRIN XL) 150 MG 24 hr tablet, Take 1 tablet (150 mg total) by mouth daily. For smoking cessation, Disp: 90 tablet, Rfl: 1   chlorhexidine (PERIDEX) 0.12 % solution, RINSE, GARGLE  AND SPIT 15 ML BY MOUTH OR THROAT TWICE DAILY AS DIRECTED, Disp: 946 mL, Rfl: 0   cyclobenzaprine (FLEXERIL) 5 MG tablet, Take 1 tablet (5 mg total) by mouth 3 (three) times daily as needed for muscle spasms., Disp: 30 tablet, Rfl: 0   FLUoxetine (PROZAC) 40 MG capsule, Take 1 capsule (40 mg total) by mouth daily., Disp: 90 capsule, Rfl: 1   fluticasone (FLONASE) 50 MCG/ACT nasal spray, Place 2 sprays into both nostrils daily., Disp: 16 g, Rfl: 6   gabapentin (NEURONTIN) 100 MG  capsule, TAKE 1 CAPSULE(100 MG) BY MOUTH THREE TIMES DAILY, Disp: 90 capsule, Rfl: 0   meloxicam (MOBIC) 15 MG tablet, Take 1 tablet (15 mg total) by mouth daily., Disp: 30 tablet, Rfl: 3   omeprazole (PRILOSEC) 40 MG capsule, Take 1 capsule (40 mg total) by mouth daily., Disp: 90 capsule, Rfl: 1  Observations/Objective: Patient is well-developed, well-nourished in no acute distress.  Resting comfortably  at home.  Head is normocephalic, atraumatic.  No labored breathing.  Speech is clear and coherent with logical content.  Patient is alert and oriented at baseline.  Abscess above top right big tooth surrounding gums appear without inflammation    Assessment and Plan:   1. Dental infection (Primary)  - chlorhexidine (PERIDEX) 0.12 % solution; Use as directed 15 mLs in the mouth or throat 2 (two) times daily as needed. Swish and spit twice daily as needed  Dispense: 120 mL; Refill: 0 - penicillin v potassium (VEETID) 500 MG tablet; Take 1 tablet (500 mg total) by mouth 3 (three) times daily for 7 days.  Dispense: 21 tablet; Refill: 0    Follow Up Instructions: I discussed the assessment and treatment plan with the patient. The patient was provided an opportunity to ask questions and all were answered. The patient agreed with the plan and demonstrated an understanding of the instructions.  A copy of instructions were sent to the patient via MyChart unless otherwise noted below.    The patient was advised to call back or seek an in-person evaluation if the symptoms worsen or if the condition fails to improve as anticipated.    Viviano Simas, FNP

## 2023-08-23 ENCOUNTER — Ambulatory Visit: Payer: Medicaid Other | Admitting: Podiatry

## 2023-08-23 NOTE — Telephone Encounter (Signed)
 Patient has been informed.

## 2023-08-31 ENCOUNTER — Ambulatory Visit: Admitting: Podiatry
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
## 2023-09-03 ENCOUNTER — Ambulatory Visit (INDEPENDENT_AMBULATORY_CARE_PROVIDER_SITE_OTHER): Admitting: Psychiatry

## 2023-09-03 ENCOUNTER — Encounter (HOSPITAL_COMMUNITY): Payer: Self-pay | Admitting: Psychiatry

## 2023-09-03 DIAGNOSIS — G479 Sleep disorder, unspecified: Secondary | ICD-10-CM

## 2023-09-03 DIAGNOSIS — F3342 Major depressive disorder, recurrent, in full remission: Secondary | ICD-10-CM | POA: Diagnosis not present

## 2023-09-03 DIAGNOSIS — F1290 Cannabis use, unspecified, uncomplicated: Secondary | ICD-10-CM | POA: Diagnosis not present

## 2023-09-03 DIAGNOSIS — F4310 Post-traumatic stress disorder, unspecified: Secondary | ICD-10-CM | POA: Insufficient documentation

## 2023-09-03 DIAGNOSIS — F411 Generalized anxiety disorder: Secondary | ICD-10-CM | POA: Insufficient documentation

## 2023-09-03 MED ORDER — PRAZOSIN HCL 1 MG PO CAPS
1.0000 mg | ORAL_CAPSULE | Freq: Every day | ORAL | 1 refills | Status: DC
Start: 2023-09-03 — End: 2023-10-29

## 2023-09-03 NOTE — Patient Instructions (Addendum)
 Thank you for attending your appointment today.  -- START Prazosin 1 mg nightly -- Continue other medications as prescribed. -- Resources for trauma informed therapy:  [email or web address] Phone: [phone] [email or web address]  Please do not make any changes to medications without first discussing with your provider. If you are experiencing a psychiatric emergency, please call 911 or present to your nearest emergency department. Additional crisis, medication management, and therapy resources are included below.  [facility]  [address] [place], [place] [address] [phone] WALK-IN URGENT CARE 24/7 FOR ANYONE [address], [place], [place]  [phone] Fax: [phone] [email or web address] *Interpreters available *Accepts all insurance and uninsured for Urgent Care needs *Accepts Medicaid and uninsured for outpatient treatment (below)      ONLY FOR [facility]  Below:    Outpatient New Patient Assessment/Therapy Walk-ins:        Monday, Wednesday, and Thursday 8am until slots are full (first come, first served)                   New Patient Psychiatry/Medication Management        Monday-Friday 8am-11am (first come, first served)               For all walk-ins we ask that you arrive by 7:15am, because patients will be seen in the order of arrival.

## 2023-09-13 ENCOUNTER — Ambulatory Visit: Admitting: Podiatry

## 2023-09-20 ENCOUNTER — Other Ambulatory Visit: Payer: Self-pay | Admitting: Physical Medicine and Rehabilitation

## 2023-09-20 DIAGNOSIS — M5416 Radiculopathy, lumbar region: Secondary | ICD-10-CM

## 2023-09-24 DIAGNOSIS — M9913 Subluxation complex (vertebral) of lumbar region: Secondary | ICD-10-CM | POA: Diagnosis not present

## 2023-09-24 DIAGNOSIS — M9915 Subluxation complex (vertebral) of pelvic region: Secondary | ICD-10-CM | POA: Diagnosis not present

## 2023-09-24 DIAGNOSIS — M9914 Subluxation complex (vertebral) of sacral region: Secondary | ICD-10-CM | POA: Diagnosis not present

## 2023-10-01 ENCOUNTER — Ambulatory Visit (INDEPENDENT_AMBULATORY_CARE_PROVIDER_SITE_OTHER): Admitting: Neurology

## 2023-10-01 ENCOUNTER — Encounter: Payer: Self-pay | Admitting: Neurology

## 2023-10-01 VITALS — BP 134/90 | HR 81 | Ht 62.0 in | Wt 170.0 lb

## 2023-10-01 DIAGNOSIS — R0681 Apnea, not elsewhere classified: Secondary | ICD-10-CM | POA: Diagnosis not present

## 2023-10-01 DIAGNOSIS — E66811 Obesity, class 1: Secondary | ICD-10-CM | POA: Diagnosis not present

## 2023-10-01 DIAGNOSIS — R519 Headache, unspecified: Secondary | ICD-10-CM | POA: Diagnosis not present

## 2023-10-01 DIAGNOSIS — R0683 Snoring: Secondary | ICD-10-CM

## 2023-10-01 DIAGNOSIS — F39 Unspecified mood [affective] disorder: Secondary | ICD-10-CM

## 2023-10-01 DIAGNOSIS — Z9189 Other specified personal risk factors, not elsewhere classified: Secondary | ICD-10-CM

## 2023-10-01 DIAGNOSIS — G4719 Other hypersomnia: Secondary | ICD-10-CM

## 2023-10-01 NOTE — Progress Notes (Signed)
 Subjective:    Patient ID: Crystal Koch is a 43 y.o. female.  HPI    [name], MD, PhD [facility] [address], [address] [address] [place], [place] [address]  Dear Crystal Koch,  I saw your patient, Crystal Koch, upon your kind request in my sleep clinic today for initial consultation of her sleep disorder, in particular, concern for underlying obstructive sleep apnea.  The patient is unaccompanied today.  As you know, [name] is a 43 year-old female with an underlying medical history of asthma, sciatica, depression, anxiety, PTSD, and overweight state, who reports snoring and excessive daytime somnolence as well as witnessed apneas, per S.O.  Her Epworth sleepiness score is 18 out of 24, fatigue severity score is 52 out of 63.  I reviewed your video visit note from 09/03/2023. She lives with her 62 year old son.  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  She typically comes all around 9 PM and goes to bed around midnight.  Rise time is between 6 and 6:30 AM.  She has recently cut back on her coffee intake but previously would drink up to 4 cups of coffee per day.  She is trying to quit smoking.  She currently smokes up to 3 cigarettes/day.  She has no nightly nocturia.  She has occasional morning headaches and nocturnal cough.  Her snoring has become louder in the past year, sleep disturbance and sleepiness during the day have been ongoing for at least 1 or 2 years.  She is not aware of any family history of sleep apnea.  She has a dog in the household.  The dog does not sleep in her bedroom at night.  She does not have a TV in her bedroom.  She drinks alcohol rarely, on special occasions.  She is working on weight loss.  Her Past Medical History Is Significant For: Past Medical History:  Diagnosis Date   Asthma 06/12/1984   Depression    PTSD (post-traumatic stress disorder)    Sciatica 06/12/2009   since son was  born. exacerbated every winter.     Her Past Surgical History Is Significant For: Past Surgical History:  Procedure Laterality Date   CESAREAN SECTION  2011    Her Family History Is Significant For: Family History  Problem Relation Age of Onset   Thyroid disease Mother    Mental illness Mother    Cancer Father    Hypertension Maternal Uncle    Cancer Maternal Grandmother    Diabetes Neg Hx     Her Social History Is Significant For: Social History   Socioeconomic History   Marital status: Single    Spouse name: Not on file   Number of children: 1   Years of education: college    Highest education level: Associate degree: occupational, Scientist, product/process development, or vocational program  Occupational History   Occupation: Homemaker   Tobacco Use   Smoking status: Every Day    Current packs/day: 0.25    Average packs/day: 0.3 packs/day for 16.0 years (4.0 ttl pk-yrs)    Types: Cigarettes   Smokeless tobacco: Never  Vaping Use   Vaping status: Never Used  Substance and Sexual Activity   Alcohol use: No   Drug use: Yes    Types: Marijuana, Psilocybin    Comment: microdosing of mushrooms twice weekly   Sexual activity: Yes    Birth control/protection: None  Other Topics Concern   Not on  file  Social History Narrative   Lives at home with 4 yo son.    Social Drivers of Health   Financial Resource Strain: High Risk (06/24/2023)   Overall Financial Resource Strain (CARDIA)    Difficulty of Paying Living Expenses: Very hard  Food Insecurity: Food Insecurity Present (06/24/2023)   Hunger Vital Sign    Worried About Running Out of Food in the Last Year: Often true    Ran Out of Food in the Last Year: Often true  Transportation Needs: Unmet Transportation Needs (06/24/2023)   PRAPARE - Administrator, Civil Service (Medical): Yes    Lack of Transportation (Non-Medical): Yes  Physical Activity: Sufficiently Active (06/24/2023)   Exercise Vital Sign    Days of Exercise per  Week: 4 days    Minutes of Exercise per Session: 150+ min  Stress: Stress Concern Present (06/24/2023)   [employer] of Occupational Health - Occupational Stress Questionnaire    Feeling of Stress : To some extent  Social Connections: Socially Isolated (06/24/2023)   Social Connection and Isolation Panel [NHANES]    Frequency of Communication with Friends and Family: More than three times a week    Frequency of Social Gatherings with Friends and Family: Once a week    Attends Religious Services: Never    Database administrator or Organizations: No    Attends Engineer, structural: Not on file    Marital Status: Separated    Her Allergies Are:  No Known Allergies:   Her Current Medications Are:  Outpatient Encounter Medications as of 10/01/2023  Medication Sig   atorvastatin  (LIPITOR) 20 MG tablet Take 1 tablet (20 mg total) by mouth daily.   buPROPion  (WELLBUTRIN  XL) 150 MG 24 hr tablet Take 1 tablet (150 mg total) by mouth daily. For smoking cessation   chlorhexidine  (PERIDEX ) 0.12 % solution RINSE, GARGLE AND SPIT 15 ML BY MOUTH OR THROAT TWICE DAILY AS DIRECTED   chlorhexidine  (PERIDEX ) 0.12 % solution Use as directed 15 mLs in the mouth or throat 2 (two) times daily as needed. Swish and spit twice daily as needed   cyclobenzaprine  (FLEXERIL ) 5 MG tablet Take 1 tablet (5 mg total) by mouth 3 (three) times daily as needed for muscle spasms.   FLUoxetine  (PROZAC ) 40 MG capsule Take 1 capsule (40 mg total) by mouth daily.   fluticasone  (FLONASE ) 50 MCG/ACT nasal spray Place 2 sprays into both nostrils daily.   gabapentin  (NEURONTIN ) 100 MG capsule TAKE 1 CAPSULE(100 MG) BY MOUTH THREE TIMES DAILY (Patient taking differently: 100 mg 2 (two) times daily.)   meloxicam  (MOBIC ) 15 MG tablet Take 1 tablet (15 mg total) by mouth daily.   omeprazole  (PRILOSEC) 40 MG capsule Take 1 capsule (40 mg total) by mouth daily.   prazosin  (MINIPRESS ) 1 MG capsule Take 1 capsule (1 mg total)  by mouth at bedtime.   Boric Acid Vaginal (AZO BORIC ACID) 600 MG SUPP Place 600 mg vaginally at bedtime. (Patient not taking: Reported on 10/01/2023)   Cholecalciferol (VITAMIN D3) 10 MCG (400 UNIT) tablet Take 400 Units by mouth daily. (Patient not taking: Reported on 10/01/2023)   No facility-administered encounter medications on file as of 10/01/2023.  :   Review of Systems:  Out of a complete 14 point review of systems, all are reviewed and negative with the exception of these symptoms as listed below:  Review of Systems  Neurological:        Room 9 Pt is  here Alone. Pt states that her boyfriend tells her all the time that she snore loud. ESS 16 FSS 52    Objective:  Neurological Exam  Physical Exam Physical Examination:   Vitals:   10/01/23 0924  BP: (!) 134/90  Pulse: 81    General Examination: The patient is a very pleasant 43 y.o. female in no acute distress. She appears well-developed and well-nourished and well groomed.   HEENT: Normocephalic, atraumatic, pupils are equal, round and reactive to light, extraocular tracking is good without limitation to gaze excursion or nystagmus noted. Hearing is grossly intact. Face is symmetric with normal facial animation. Speech is clear with no dysarthria noted. There is no hypophonia. There is no lip, neck/head, jaw or voice tremor. Neck is supple with full range of passive and active motion. There are no carotid bruits on auscultation. Oropharynx exam reveals: mild mouth dryness, adequate dental hygiene and moderate airway crowding, due to small airway entry, wider uvula, tonsillar size 1+ bilaterally, Mallampati class II.  Neck circumference 14-1/4 inches, slight underbite noted. Tongue protrudes centrally and palate elevates symmetrically.   Chest: Clear to auscultation without wheezing, rhonchi or crackles noted.  Heart: S1+S2+0, regular and normal without murmurs, rubs or gallops noted.   Abdomen: Soft, non-tender and  non-distended.  Extremities: There is no pitting edema in the distal lower extremities bilaterally.   Skin: Warm and dry without trophic changes noted.   Musculoskeletal: exam reveals no obvious joint deformities.   Neurologically:  Mental status: The patient is awake, alert and oriented in all 4 spheres. Her immediate and remote memory, attention, language skills and fund of knowledge are appropriate. There is no evidence of aphasia, agnosia, apraxia or anomia. Speech is clear with normal prosody and enunciation. Thought process is linear. Mood is normal and affect is normal.  Cranial nerves II - XII are as described above under HEENT exam.  Motor exam: Normal bulk, strength and tone is noted. There is no obvious action or resting tremor.  Fine motor skills and coordination: grossly intact.  Cerebellar testing: No dysmetria or intention tremor. There is no truncal or gait ataxia.  Sensory exam: intact to light touch in the upper and lower extremities.  Gait, station and balance: She stands easily. No veering to one side is noted. No leaning to one side is noted. Posture is age-appropriate and stance is narrow based. Gait shows normal stride length and normal pace. No problems turning are noted.   Assessment and plan:   In summary, Malory Spurr is a very pleasant 43 year-old female with an underlying medical history of asthma, sciatica, depression, anxiety, PTSD, and overweight state, whose history and physical exam are concerning for sleep disordered breathing, particularly obstructive sleep apnea (OSA). A laboratory attended sleep study is typically considered "gold standard" for evaluation of sleep disordered breathing.   I had a long chat with the patient about my findings and the diagnosis of sleep apnea, particularly OSA, its prognosis and treatment options. We talked about medical/conservative treatments, surgical interventions and non-pharmacological approaches for symptom control. I  explained, in particular, the risks and ramifications of untreated moderate to severe OSA, especially with respect to developing cardiovascular disease down the road, including congestive heart failure (CHF), difficult to treat hypertension, cardiac arrhythmias (particularly A-fib), neurovascular complications including TIA, stroke and dementia. Even type 2 diabetes has, in part, been linked to untreated OSA. Symptoms of untreated OSA may include (but may not be limited to) daytime sleepiness, nocturia (i.e. frequent nighttime  urination), memory problems, mood irritability and suboptimally controlled or worsening mood disorder such as depression and/or anxiety, lack of energy, lack of motivation, physical discomfort, as well as recurrent headaches, especially morning or nocturnal headaches. We talked about the importance of maintaining a healthy lifestyle and striving for healthy weight.  The importance of complete smoking cessation was also addressed.  In addition, we talked about the importance of striving for and maintaining good sleep hygiene. I recommended a sleep study at this time. I outlined the differences between a laboratory attended sleep study which is considered more comprehensive and accurate over the option of a home sleep test (HST); the latter may lead to underestimation of sleep disordered breathing in some instances and does not help with diagnosing upper airway resistance syndrome and is not accurate enough to diagnose primary central sleep apnea typically. I outlined possible surgical and non-surgical treatment options of OSA, including the use of a positive airway pressure (PAP) device (i.e. CPAP, AutoPAP/APAP or BiPAP in certain circumstances), a custom-made dental device (aka oral appliance, which would require a referral to a specialist dentist or orthodontist typically, and is generally speaking not considered for patients with full dentures or edentulous state), upper airway surgical  options, such as traditional UPPP (which is not considered a first-line treatment) or the Inspire device (hypoglossal nerve stimulator, which would involve a referral for consultation with an ENT surgeon, after careful selection, following inclusion criteria - also not first-line treatment). I explained the PAP treatment option to the patient in detail, as this is generally considered first-line treatment.  The patient indicated that she would be willing to try PAP therapy, if the need arises. I explained the importance of being compliant with PAP treatment, not only for insurance purposes but primarily to improve patient's symptoms symptoms, and for the patient's long term health benefit, including to reduce Her cardiovascular risks longer-term.    We will pick up our discussion about the next steps and treatment options after testing.  We will keep her posted as to the test results by phone call and/or MyChart messaging where possible.  We will plan to follow-up in sleep clinic accordingly as well.  I answered all her questions today and the patient was in agreement.   I encouraged her to call with any interim questions, concerns, problems or updates or email us  through MyChart.  Generally speaking, sleep test authorizations may take up to 2 weeks, sometimes less, sometimes longer, the patient is encouraged to get in touch with us  if they do not hear back from the sleep lab staff directly within the next 2 weeks.  Thank you very much for allowing me to participate in the care of this nice patient. If I can be of any further assistance to you please do not hesitate to call me at [phone].  Sincerely,   [name], MD, PhD

## 2023-10-01 NOTE — Patient Instructions (Signed)

## 2023-10-02 ENCOUNTER — Telehealth: Payer: Self-pay | Admitting: Neurology

## 2023-10-02 NOTE — Telephone Encounter (Signed)
 [employer] [facility] [employer] no auth req via fax

## 2023-10-02 NOTE — Telephone Encounter (Signed)
 [employer] [facility] [employer] pending

## 2023-10-03 ENCOUNTER — Telehealth: Payer: Self-pay | Admitting: Physical Medicine and Rehabilitation

## 2023-10-03 NOTE — Telephone Encounter (Signed)
 Pt need to reschedule the appt on 10/08/23

## 2023-10-04 ENCOUNTER — Ambulatory Visit: Attending: Family Medicine | Admitting: Physician Assistant

## 2023-10-04 ENCOUNTER — Encounter: Payer: Self-pay | Admitting: Physician Assistant

## 2023-10-04 VITALS — BP 117/79 | HR 90 | Temp 98.4°F | Resp 16 | Ht 63.0 in | Wt 168.0 lb

## 2023-10-04 DIAGNOSIS — M51369 Other intervertebral disc degeneration, lumbar region without mention of lumbar back pain or lower extremity pain: Secondary | ICD-10-CM | POA: Diagnosis not present

## 2023-10-04 DIAGNOSIS — Z139 Encounter for screening, unspecified: Secondary | ICD-10-CM

## 2023-10-04 DIAGNOSIS — M65331 Trigger finger, right middle finger: Secondary | ICD-10-CM

## 2023-10-04 DIAGNOSIS — M65332 Trigger finger, left middle finger: Secondary | ICD-10-CM

## 2023-10-04 MED ORDER — CYCLOBENZAPRINE HCL 5 MG PO TABS: 5.0000 mg | ORAL_TABLET | Freq: Three times a day (TID) | ORAL | 2 refills | Status: AC | PRN

## 2023-10-04 MED ORDER — GABAPENTIN 100 MG PO CAPS: 100.0000 mg | ORAL_CAPSULE | Freq: Two times a day (BID) | ORAL | 3 refills | Status: AC

## 2023-10-04 NOTE — Progress Notes (Signed)
 Has pain in hands, specifically middle fingers Pain with touching & stiffness throughout the day  Pain 8/10

## 2023-10-04 NOTE — Progress Notes (Signed)
 Patient ID: Crystal Koch, female   DOB: 1981-01-19, 43 y.o.   MRN: [id number]   Crystal Koch, is a 43 y.o. female  [id number]  [id number]  DOB - 02-18-1981  Chief Complaint  Patient presents with   Hand Pain       Subjective:   Crystal Koch is a 43 y.o. female here today for B middle finger stiffness and unable to fully open hands without straightening her middle fingers.  Unable to grip as strong bc can't bend middle finger all the way in.  R hand dominant.  R hand worse than left  Needs RF on gabapentin  and flexeril .  About to get lumbar injections.   No problems updated.  ALLERGIES: No Known Allergies  PAST MEDICAL HISTORY: Past Medical History:  Diagnosis Date   Asthma 06/12/1984   Depression    PTSD (post-traumatic stress disorder)    Sciatica 06/12/2009   since son was born. exacerbated every winter.     MEDICATIONS AT HOME: Prior to Admission medications   Medication Sig Start Date End Date Taking? Authorizing Provider  atorvastatin  (LIPITOR) 20 MG tablet Take 1 tablet (20 mg total) by mouth daily. 06/27/23  Yes Newlin, Enobong, MD  buPROPion  (WELLBUTRIN  XL) 150 MG 24 hr tablet Take 1 tablet (150 mg total) by mouth daily. For smoking cessation 06/25/23  Yes Newlin, Enobong, MD  chlorhexidine  (PERIDEX ) 0.12 % solution RINSE, GARGLE AND SPIT 15 ML BY MOUTH OR THROAT TWICE DAILY AS DIRECTED 05/07/23  Yes Newlin, Enobong, MD  chlorhexidine  (PERIDEX ) 0.12 % solution Use as directed 15 mLs in the mouth or throat 2 (two) times daily as needed. Swish and spit twice daily as needed 08/22/23  Yes Mardene Shake, FNP  FLUoxetine  (PROZAC ) 40 MG capsule Take 1 capsule (40 mg total) by mouth daily. 06/25/23  Yes Newlin, Enobong, MD  fluticasone  (FLONASE ) 50 MCG/ACT nasal spray Place 2 sprays into both nostrils daily. 04/18/23  Yes Hassie Lint, PA-C  meloxicam  (MOBIC ) 15 MG tablet Take 1 tablet (15 mg total) by mouth daily. 06/25/23  Yes Newlin, Enobong, MD  omeprazole   (PRILOSEC) 40 MG capsule Take 1 capsule (40 mg total) by mouth daily. 06/25/23  Yes Newlin, Enobong, MD  prazosin  (MINIPRESS ) 1 MG capsule Take 1 capsule (1 mg total) by mouth at bedtime. 09/03/23 11/02/23 Yes Bahraini, Sarah A  Boric Acid Vaginal (AZO BORIC ACID) 600 MG SUPP Place 600 mg vaginally at bedtime. Patient not taking: Reported on 10/04/2023 06/28/23   Newlin, Enobong, MD  Cholecalciferol (VITAMIN D3) 10 MCG (400 UNIT) tablet Take 400 Units by mouth daily. Patient not taking: Reported on 10/04/2023    [provider]  cyclobenzaprine  (FLEXERIL ) 5 MG tablet Take 1 tablet (5 mg total) by mouth 3 (three) times daily as needed for muscle spasms. 10/04/23   Hassie Lint, PA-C  gabapentin  (NEURONTIN ) 100 MG capsule Take 1 capsule (100 mg total) by mouth 2 (two) times daily. 10/04/23   Taraya Steward, Stan Eans, PA-C    ROS: Neg HEENT Neg resp Neg cardiac Neg GI Neg GU Neg MS Neg psych Neg neuro  Objective:   Vitals:   10/04/23 0854  BP: 117/79  Pulse: 90  Resp: 16  Temp: 98.4 F (36.9 C)  SpO2: 99%  Weight: 168 lb (76.2 kg)  Height: 5\' 3"  (1.6 m)   Exam General appearance : Awake, alert, not in any distress. Speech Clear. Not toxic looking HEENT: Atraumatic and Normocephalic Chest: Good air entry bilaterally, CTAB.  No rales/rhonchi/wheezing  CVS: S1 S2 regular, no murmurs.  Decreased grip due to B middle fingers not fully closing.  Good strength overall  no swelling over palm B.  No erythema.   Neurology: Awake alert, and oriented X 3, CN II-XII intact, Non focal Skin: No Rash  Data Review Lab Results  Component Value Date   HGBA1C 5.8 (H) 03/02/2022   HGBA1C 5.7 (H) 03/24/2021    Assessment & Plan   1. Bulging lumbar disc - gabapentin  (NEURONTIN ) 100 MG capsule; Take 1 capsule (100 mg total) by mouth 2 (two) times daily.  Dispense: 60 capsule; Refill: 3 - cyclobenzaprine  (FLEXERIL ) 5 MG tablet; Take 1 tablet (5 mg total) by mouth 3 (three) times daily as  needed for muscle spasms.  Dispense: 30 tablet; Refill: 2  2. Acquired trigger finger of both middle fingers (Primary) - Ambulatory referral to Hand Surgery    Return in about 6 months (around 04/04/2024) for PCP for chronic conditions.  The patient was given clear instructions to go to ER or return to medical center if symptoms don't improve, worsen or new problems develop. The patient verbalized understanding. The patient was told to call to get lab results if they haven't heard anything in the next week.      Dulce Gibbs, PA-C [facility] [place], [place] [phone]   10/04/2023, 9:15 AM

## 2023-10-04 NOTE — Addendum Note (Signed)
 Addended by: Roseann Kees on: 10/04/2023 01:37 PM   Modules accepted: Orders

## 2023-10-04 NOTE — Patient Instructions (Signed)
Trigger Finger  Trigger finger, also called stenosing tenosynovitis, is a condition that causes a finger or thumb to get stuck in a bent position. Each finger has tough, cord-like tissue (tendon) that connects muscle to bone, and each tendon passes through a tunnel of tissue (tendon sheath). The tendon sheaths are held close to the bone by a pulley. There is a pulley called the A1 pulley that is involved in the triggering of a finger or thumb. To move your finger, your tendon needs to glide freely through the sheath. Trigger finger happens when the tendon or the sheath thickens, making it difficult to bend or straighten your finger as the thickened tendon gets stuck in the A1 pulley. Trigger finger can affect any of the fingers or the thumbs. Mild cases may clear up with rest and medicine. Severe cases require more treatment. What are the causes? Trigger finger or thumb is caused by a thickened finger tendon or tendon sheath. The cause of this thickening is not known. What increases the risk? The following factors may make you more likely to develop this condition: Doing the same movements many times (repetitive activity) that require a strong grip. Having certain health conditions. These include rheumatoid arthritis, gout, carpal tunnel syndrome, or diabetes. Being 40-60 years old. Being female. What are the signs or symptoms? Symptoms of this condition include: Pain when bending or straightening your finger. Tenderness, swelling, or a lump in the palm of your hand just below the finger joint. Hearing a noise like a pop or a snap when you try to straighten your finger. Feeling a catch or locked feeling when you try to straighten your finger. Being unable to straighten your finger without help from your other hand. How is this diagnosed? This condition is diagnosed based on your symptoms and a physical exam. How is this treated? This condition may be treated by: Resting your finger and  avoiding activities that make symptoms worse. Wearing a finger splint to keep your finger extended. Taking NSAIDs, such as ibuprofen, to relieve pain and swelling around the tendon. Doing gentle exercises to stretch the finger as told by your health care provider. Having medicine that reduces swelling and inflammation (steroids) injected into the tendon sheath. Injections may need to be repeated. Trigger finger release. This surgery is done to open the pulley. This may be done if other treatments do not work and you cannot straighten or bend your finger. You may need hand therapy after surgery. Follow these instructions at home: If you have a removable splint: Wear the splint as told by your provider. Remove it only as told by your provider. Check the skin around the splint every day. Tell your provider about any concerns. Loosen the splint if your fingers tingle, become numb, or turn cold and blue. Keep the splint clean and dry. If the splint is not waterproof: Do not let it get wet. Cover it with a watertight covering when you take a bath or shower. Managing pain, stiffness, and swelling     If told, put ice on the painful area. If you have a removable splint, remove it as told by your health care provider. Put ice in a plastic bag. Place a towel between your skin and the bag or between your splint and the bag. Leave the ice on for 20 minutes, 2-3 times a day. If told, apply heat to the affected area as often as told by your provider. Use the heat source that your provider recommends, such as   a moist heat pack or a heating pad. Place a towel between your skin and the heat source. Leave the heat on for 20-30 minutes. If your skin turns bright red, remove the ice or heat right away to prevent skin damage. The risk of damage is higher if you cannot feel pain, heat, or cold. Activity Rest your finger as told by your provider. Avoid activities that make the pain worse. Return to your  normal activities as told by your provider. Ask your provider what activities are safe for you. Do exercises as told by your provider. Ask your provider when it is safe to drive if you have a splint on your hand. General instructions Take over-the-counter and prescription medicines only as told by your provider. Keep all follow-up visits. These are needed to see how you are progressing. Contact a health care provider if: Your symptoms are not improving with home care. This information is not intended to replace advice given to you by your health care provider. Make sure you discuss any questions you have with your health care provider. Document Revised: 01/13/2022 Document Reviewed: 01/13/2022 Elsevier Patient Education  2024 [employer]vier Inc.  

## 2023-10-08 ENCOUNTER — Encounter: Admitting: Physical Medicine and Rehabilitation

## 2023-10-08 NOTE — Telephone Encounter (Signed)
 Spoke with the patient.  [employer] [facility] no auth req   She is scheduled at [facility] for 11/19/23 at 9 pm.  Mailed packet and sent mychart.

## 2023-10-09 ENCOUNTER — Encounter (HOSPITAL_COMMUNITY): Payer: Self-pay | Admitting: Licensed Clinical Social Worker

## 2023-10-09 ENCOUNTER — Ambulatory Visit (HOSPITAL_COMMUNITY): Admitting: Licensed Clinical Social Worker

## 2023-10-09 DIAGNOSIS — F411 Generalized anxiety disorder: Secondary | ICD-10-CM

## 2023-10-09 DIAGNOSIS — F329 Major depressive disorder, single episode, unspecified: Secondary | ICD-10-CM | POA: Diagnosis not present

## 2023-10-09 DIAGNOSIS — F3342 Major depressive disorder, recurrent, in full remission: Secondary | ICD-10-CM

## 2023-10-09 DIAGNOSIS — F4310 Post-traumatic stress disorder, unspecified: Secondary | ICD-10-CM
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
## 2023-10-12 ENCOUNTER — Telehealth: Payer: Self-pay | Admitting: *Deleted

## 2023-10-12 NOTE — Progress Notes (Signed)
 Complex Care Management Note Care Guide Note  10/12/2023 Name: Crystal Koch MRN: [id number] DOB: May 15, 1981  Crystal Koch is a 43 y.o. year old female who is a primary care patient of Newlin, Enobong, MD . The community resource team was consulted for assistance with Transportation Needs , Food Insecurity, Caregiver Stress, and Financial Difficulties related to utility   SDOH screenings and interventions completed:  Yes     SDOH Interventions Today    Flowsheet Row Most Recent Value  SDOH Interventions   Food Insecurity Interventions AMB Referral, [employer] Provided, [id number] Referral  [Also gave food banks  website]  Transportation Interventions --  [[employer] transportation]  Utilities Interventions Community Resources Provided  [No utility assistance]        Care guide performed the following interventions: Patient provided with information about care guide support team and interviewed to confirm resource needs.  Follow Up Plan:  No further follow up planned at this time. The patient has been provided with needed resources.  Encounter Outcome:  Patient Visit Completed  [name]  [facility] HealthPopulation Health Care Guide  Direct [phone] [phone] Website: [email or web address]

## 2023-10-12 NOTE — Progress Notes (Signed)
 Complex Care Management Note  Care Guide Note 10/12/2023 Name: Crystal Koch MRN: [id number] DOB: 08/20/1980  Crystal Koch is a 43 y.o. year old female who sees Joaquin Mulberry, MD for primary care. I reached out to Lynna Sarks by phone today to offer complex care management services.  Ms. Creppel was given information about Complex Care Management services today including:   The Complex Care Management services include support from the care team which includes your Nurse Care Manager, Clinical Social Worker, or Pharmacist.  The Complex Care Management team is here to help remove barriers to the health concerns and goals most important to you. Complex Care Management services are voluntary, and the patient may decline or stop services at any time by request to their care team member.   Complex Care Management Consent Status: Patient agreed to services and verbal consent obtained.   Follow up plan:  Telephone appointment with complex care management team member scheduled for:  5/19  Encounter Outcome:  Patient Scheduled  Barnie Bora  [facility] Health  [facility] Guide  Direct Dial: [phone]  Fax [phone]

## 2023-10-15 ENCOUNTER — Encounter: Admitting: Physical Medicine and Rehabilitation

## 2023-10-15 DIAGNOSIS — M9915 Subluxation complex (vertebral) of pelvic region: Secondary | ICD-10-CM | POA: Diagnosis not present

## 2023-10-15 DIAGNOSIS — M9913 Subluxation complex (vertebral) of lumbar region: Secondary | ICD-10-CM | POA: Diagnosis not present

## 2023-10-15 DIAGNOSIS — M9914 Subluxation complex (vertebral) of sacral region: Secondary | ICD-10-CM | POA: Diagnosis not present

## 2023-10-17 ENCOUNTER — Ambulatory Visit (HOSPITAL_COMMUNITY): Admitting: Psychiatry

## 2023-10-22 ENCOUNTER — Other Ambulatory Visit (HOSPITAL_COMMUNITY): Payer: Self-pay

## 2023-10-22 ENCOUNTER — Ambulatory Visit: Admitting: Sports Medicine

## 2023-10-22 ENCOUNTER — Other Ambulatory Visit (INDEPENDENT_AMBULATORY_CARE_PROVIDER_SITE_OTHER): Payer: Self-pay

## 2023-10-22 ENCOUNTER — Other Ambulatory Visit: Payer: Self-pay | Admitting: Family Medicine

## 2023-10-22 ENCOUNTER — Encounter: Payer: Self-pay | Admitting: Sports Medicine

## 2023-10-22 DIAGNOSIS — M65331 Trigger finger, right middle finger: Secondary | ICD-10-CM

## 2023-10-22 DIAGNOSIS — M79642 Pain in left hand: Secondary | ICD-10-CM | POA: Diagnosis not present

## 2023-10-22 DIAGNOSIS — M79641 Pain in right hand: Secondary | ICD-10-CM | POA: Diagnosis not present

## 2023-10-22 DIAGNOSIS — R200 Anesthesia of skin: Secondary | ICD-10-CM

## 2023-10-22 DIAGNOSIS — M65332 Trigger finger, left middle finger: Secondary | ICD-10-CM

## 2023-10-22 DIAGNOSIS — M9913 Subluxation complex (vertebral) of lumbar region: Secondary | ICD-10-CM | POA: Diagnosis not present

## 2023-10-22 DIAGNOSIS — M9915 Subluxation complex (vertebral) of pelvic region: Secondary | ICD-10-CM | POA: Diagnosis not present

## 2023-10-22 DIAGNOSIS — M9914 Subluxation complex (vertebral) of sacral region: Secondary | ICD-10-CM | POA: Diagnosis not present

## 2023-10-22 DIAGNOSIS — R202 Paresthesia of skin: Secondary | ICD-10-CM | POA: Diagnosis not present

## 2023-10-22 MED ORDER — VITAMIN D3 10 MCG (400 UNIT) PO TABS
400.0000 [IU] | ORAL_TABLET | Freq: Every day | ORAL | 1 refills | Status: DC
  Filled 2023-10-22: qty 100, 100d supply, fill #0
  Filled 2024-02-13: qty 100, 100d supply, fill #1

## 2023-10-22 MED ORDER — DICLOFENAC SODIUM 50 MG PO TBEC: 50.0000 mg | DELAYED_RELEASE_TABLET | Freq: Two times a day (BID) | ORAL | 0 refills | Status: DC

## 2023-10-22 NOTE — Progress Notes (Addendum)
 Crystal Koch - 43 y.o. female MRN [id number]  Date of birth: [birth year]  Office Visit Note: Visit Date: 10/22/2023 PCP: Joaquin Mulberry, MD Referred by: Hassie Lint, PA-C  Subjective: Chief Complaint  Patient presents with   Right Hand - Pain   Left Hand - Pain   HPI: Crystal Koch is a very pleasant 43 y.o. female who presents today for bilateral hand pain with pain and stiffness in the third finger as well as numbness and tingling of each hand and fingers.  She has pain in the palmar aspect of the hand mostly in the third finger bilaterally.  She does report previous episodes of triggering where she would have to active or passively open the third digit bilaterally.  This has not happened since January.  Her symptoms have been worsening since January of this year.  They are worse in the morning with associated swelling.  She also however gets numbness and tingling in all the fingers but worse in the 3rd and 4th digit.  Gabapentin  does help this to some extent.  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  She is having difficulty with closed fist.  She does use Gabapentin  100mg  TID, which does help her symptoms. She also is using Meloxicam  15mg  every day which she does not feel is that helpful anymore.  She does run her hands under hot water which will help.  Pertinent ROS were reviewed with the patient and found to be negative unless otherwise specified above in HPI.   Assessment & Plan: Visit Diagnoses:  1. Bilateral hand pain   2. Numbness and tingling in both hands   3. Trigger finger, left middle finger   4. Trigger finger, right middle finger    Plan: Impression is bilateral hand pain which is multifactorial in nature.  She does report triggering episodes for the third finger on both hands, although has not had active bleeding since January 2025.  She does have tenderness over the A1  nodule however with an underlying degree of tendinitis given her repetitive work activity/overuse with her hands. 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  Given this, I would like to have these further evaluated by my partner Dr. Daisey Dryer with the bilateral upper extremity NCS/EMG.  For her current symptoms, she may continue gabapentin  100 mg 3 times daily, we could consider increasing this at times.  Meloxicam  is no longer helpful for her, we will discontinue this and transition her to oral diclofenac  50 mg twice daily with food.  I will follow-up with her following the EMG/NCS to discuss next steps.  Additional work-up/treatment considerations: Laboratory workup for rheumatologic conditions, injection therapy (A1 nodule/CTS).  Follow-up: Return for appt with Dr. Daisey Dryer for NCS/EMG for b/l hand N/T.   Meds & Orders:  Meds ordered this encounter  Medications   diclofenac  (VOLTAREN ) 50 MG EC tablet    Sig: Take 1 tablet (50 mg total) by mouth 2 (two) times daily.    Dispense:  60 tablet    Refill:  0    Orders Placed This Encounter  Procedures   XR Hand Complete Right   XR Hand Complete Left   Ambulatory referral to Physical Medicine Rehab     Procedures: No procedures performed      Clinical History:   She  reports that she quit smoking about 35 years ago. Her smoking use included cigarettes. She started smoking about 2 weeks ago. She has a 4 pack-year smoking history. She has never used smokeless tobacco. No results for input(s): "HGBA1C", "LABURIC" in the last 8760 hours.  Objective:   Vital Signs: LMP 09/29/2023 (Exact Date)   Physical Exam  Gen: Well-appearing, in no acute distress; non-toxic CV: Well-perfused. Warm.  Resp: Breathing unlabored on room air; no wheezing. Psych: Fluid speech in conversation; appropriate affect; normal thought process  Ortho Exam - Bilateral  hands/wrists: + CP over the A1 nodule on the palmar aspect of the third finger bilaterally.  There is no reproducible triggering episodes noted on exam today.  There is pain with clenched fist grip over the third MCP but also of the 2 through 4 digits. + Tinel's test bilaterally over the carpal tunnel, positive Phalen's, reverse Phalen's and Durkan's test.  Imaging: XR Hand Complete Right Result Date: 10/22/2023 3 views of the right and left hand including AP, oblique and lateral film were ordered and reviewed by myself.  X-rays demonstrate no significant arthritic change about the hands or digits.  There is a slight negative ulnar variance bilaterally.  No other acute bony abnormality noted.  XR Hand Complete Left Result Date: 10/22/2023 3 views of the right and left hand including AP, oblique and lateral film were ordered and reviewed by myself.  X-rays demonstrate no significant arthritic change about the hands or digits.  There is a slight negative ulnar variance bilaterally.  No other acute bony abnormality noted.   Past Medical/Family/Surgical/Social History: Medications & Allergies reviewed per EMR, new medications updated. Patient Active Problem List   Diagnosis Date Noted   PTSD (post-traumatic stress disorder) 09/03/2023   GAD (generalized anxiety disorder) 09/03/2023   MDD (major depressive disorder), recurrent, in full remission (HCC) 09/03/2023   Use of cannabis 09/03/2023   Anxiety 05/16/2023   Positive depression screening 04/18/2023   Lumbar radiculopathy 04/18/2023   Acute non-recurrent sinusitis 05/31/2021   Vitamin D  deficiency 06/22/2014   Sciatica of left side 06/19/2014   Current smoker 06/19/2014   Past Medical History:  Diagnosis Date   Asthma 06/12/1984   Depression    PTSD (post-traumatic stress disorder)    Sciatica 06/12/2009   since son was born. exacerbated every winter.    Family History  Problem Relation Age of Onset   Thyroid disease Mother     Mental illness Mother    Cancer Father    Hypertension Maternal Uncle    Cancer Maternal Grandmother    Diabetes Neg Hx    Past Surgical History:  Procedure Laterality Date   CESAREAN SECTION  2011   Social History   Occupational History   Occupation: Homemaker   Tobacco Use   Smoking status: Former    Average packs/day: 0.3 packs/day for 16.0 years (4.0 ttl pk-yrs)    Types: Cigarettes    Start date: 10/03/2023    Quit date: 06/12/1988    Years since quitting: 35.3   Smokeless tobacco: Never  Vaping Use   Vaping status: Never Used  Substance and Sexual Activity   Alcohol use: No   Drug use: Yes    Types: Marijuana, Psilocybin    Comment: microdosing of mushrooms twice weekly recently stopped at of 09/11/23. Marijuana: Every night after work 1 joint   Sexual activity: Yes    Partners: Male    Birth control/protection: None

## 2023-10-22 NOTE — Progress Notes (Signed)
 Patient says that she has had pain and swelling in both hands since the beginning of the year. She denies any known injury. She says that her pain will begin in the palm of the hand and goes up into the middle finger. She says that it is worse in the mornings and at the end of the workday, and that at the end of the day she will run her hands under hot water which does help. She takes Gabapentin  nightly for something else but does find that it will give her relief in her hands as well. She says that she will have numbness and tingling all over the hands, especially after walking her dog and holding the leash; this typically goes away if she rubs her hands together for a few minutes. She says that she cannot make a full fist due to pain and feeling stuck. She denies any popping or clicking. She also mentions using a stress ball which will give her temporary relief of pain.

## 2023-10-23 ENCOUNTER — Other Ambulatory Visit: Payer: Self-pay

## 2023-10-23 ENCOUNTER — Telehealth: Admitting: Licensed Clinical Social Worker

## 2023-10-24 ENCOUNTER — Encounter: Payer: Self-pay | Admitting: Sports Medicine

## 2023-10-25 ENCOUNTER — Other Ambulatory Visit: Payer: Self-pay | Admitting: Sports Medicine

## 2023-10-25 MED ORDER — INDOMETHACIN 50 MG PO CAPS: 50.0000 mg | ORAL_CAPSULE | Freq: Two times a day (BID) | ORAL | 0 refills | Status: AC
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
## 2023-10-29 ENCOUNTER — Encounter (HOSPITAL_COMMUNITY): Payer: Self-pay

## 2023-10-29 ENCOUNTER — Ambulatory Visit: Admitting: Physical Medicine and Rehabilitation

## 2023-10-29 ENCOUNTER — Encounter (HOSPITAL_COMMUNITY): Payer: Self-pay | Admitting: Psychiatry

## 2023-10-29 ENCOUNTER — Encounter: Payer: Self-pay | Admitting: Physical Medicine and Rehabilitation

## 2023-10-29 ENCOUNTER — Other Ambulatory Visit (HOSPITAL_COMMUNITY): Payer: Self-pay

## 2023-10-29 ENCOUNTER — Other Ambulatory Visit: Payer: Self-pay

## 2023-10-29 ENCOUNTER — Telehealth (HOSPITAL_COMMUNITY): Admitting: Psychiatry

## 2023-10-29 ENCOUNTER — Other Ambulatory Visit: Payer: Self-pay | Admitting: Licensed Clinical Social Worker

## 2023-10-29 VITALS — BP 144/95 | HR 105

## 2023-10-29 DIAGNOSIS — M5416 Radiculopathy, lumbar region: Secondary | ICD-10-CM

## 2023-10-29 DIAGNOSIS — M9915 Subluxation complex (vertebral) of pelvic region: Secondary | ICD-10-CM | POA: Diagnosis not present

## 2023-10-29 DIAGNOSIS — F1290 Cannabis use, unspecified, uncomplicated: Secondary | ICD-10-CM

## 2023-10-29 DIAGNOSIS — F411 Generalized anxiety disorder: Secondary | ICD-10-CM | POA: Diagnosis not present

## 2023-10-29 DIAGNOSIS — F3342 Major depressive disorder, recurrent, in full remission: Secondary | ICD-10-CM | POA: Diagnosis not present

## 2023-10-29 DIAGNOSIS — F4310 Post-traumatic stress disorder, unspecified: Secondary | ICD-10-CM | POA: Diagnosis not present

## 2023-10-29 DIAGNOSIS — M9913 Subluxation complex (vertebral) of lumbar region: Secondary | ICD-10-CM | POA: Diagnosis not present

## 2023-10-29 DIAGNOSIS — M9914 Subluxation complex (vertebral) of sacral region: Secondary | ICD-10-CM | POA: Diagnosis not present

## 2023-10-29 MED ORDER — BUPROPION HCL ER (XL) 150 MG PO TB24
150.0000 mg | ORAL_TABLET | Freq: Every day | ORAL | 0 refills | Status: DC
  Filled 2023-10-29 – 2023-10-31 (×3): qty 90, 90d supply, fill #0

## 2023-10-29 MED ORDER — METHYLPREDNISOLONE ACETATE 40 MG/ML IJ SUSP
40.0000 mg | Freq: Once | INTRAMUSCULAR | Status: AC
  Administered 2023-10-29: 40 mg

## 2023-10-29 MED ORDER — FLUOXETINE HCL 40 MG PO CAPS
40.0000 mg | ORAL_CAPSULE | Freq: Every day | ORAL | 0 refills | Status: DC
  Filled 2023-10-29 – 2023-10-31 (×3): qty 90, 90d supply, fill #0

## 2023-10-29 MED ORDER — PRAZOSIN HCL 1 MG PO CAPS
1.0000 mg | ORAL_CAPSULE | Freq: Every day | ORAL | 0 refills | Status: DC
  Filled 2023-10-29 (×2): qty 90, 90d supply, fill #0

## 2023-10-29 NOTE — Patient Instructions (Signed)

## 2023-10-29 NOTE — Patient Instructions (Signed)

## 2023-10-29 NOTE — Patient Outreach (Addendum)
 Complex Care Management   Visit Note  10/29/2023  Name:  Crystal Koch MRN: [id number] DOB: Dec 08, 1980  Situation: Referral received for Complex Care Management related to SDOH Barriers:  Food insecurity Lack of essential utilities assistance with pymt I obtained verbal consent from Patient.  Visit completed with A Duck  on the phone. Patient is currently in compliance with mental health recommendations. Pt is followed by [facility] for therapy and medication mgmt   Background:   Past Medical History:  Diagnosis Date   Asthma 06/12/1984   Depression    PTSD (post-traumatic stress disorder)    Sciatica 06/12/2009   since son was born. exacerbated every winter.     Assessment: Patient Reported Symptoms:  Cognitive Cognitive Status: Able to follow simple commands, Alert and oriented to person, place, and time, Normal speech and language skills Cognitive/Intellectual Conditions Management [RPT]: Not Assessed      Neurological Neurological Review of Symptoms: No symptoms reported    HEENT HEENT Symptoms Reported: No symptoms reported      Cardiovascular Cardiovascular Symptoms Reported: No symptoms reported Does patient have uncontrolled Hypertension?: No    Respiratory Respiratory Symptoms Reported: No symptoms reported    Endocrine Patient reports the following symptoms related to hypoglycemia or hyperglycemia : No symptoms reported Is patient diabetic?: No    Gastrointestinal Gastrointestinal Symptoms Reported: No symptoms reported      Genitourinary Genitourinary Symptoms Reported: No symptoms reported    Integumentary Integumentary Symptoms Reported: No symptoms reported    Musculoskeletal Musculoskelatal Symptoms Reviewed: No symptoms reported   Falls in the past year?: No Number of falls in past year: 1 or less Was there an injury with Fall?: No Fall Risk Category Calculator: 0 Patient Fall Risk Level: Low Fall Risk Patient at Risk for Falls Due to: No Fall Risks   Psychosocial       Do you feel physically threatened by others?: No      10/09/2023   10:09 AM  Depression screen PHQ 2/9  Decreased Interest 1  Down, Depressed, Hopeless 1  PHQ - 2 Score 2  Altered sleeping 3  Tired, decreased energy 2  Change in appetite 0  Feeling bad or failure about yourself  1  Trouble concentrating 3  Moving slowly or fidgety/restless 0  Suicidal thoughts 0  PHQ-9 Score 11  Difficult doing work/chores Somewhat difficult    There were no vitals filed for this visit.  Medications Reviewed Today     Reviewed by Fletcher Humble, LCSW (Social Worker) on 10/29/23 at 1559  Med List Status: <None>   Medication Order Taking? Sig Documenting Provider Last Dose Status Informant  atorvastatin  (LIPITOR) 20 MG tablet [id number] No Take 1 tablet (20 mg total) by mouth daily. Newlin, Enobong, MD Taking Active   Boric Acid Vaginal (AZO BORIC ACID) 600 MG SUPP [id number] No Place 600 mg vaginally at bedtime.  Patient not taking: Reported on 10/04/2023   Newlin, Enobong, MD Not Taking Active   buPROPion  (WELLBUTRIN  XL) 150 MG 24 hr tablet [id number]  Take 1 tablet (150 mg total) by mouth daily for smoking cessation Bahraini, Genevive Ket  Active   chlorhexidine  (PERIDEX ) 0.12 % solution [id number] No RINSE, GARGLE AND SPIT 15 ML BY MOUTH OR THROAT TWICE DAILY AS DIRECTED Newlin, Enobong, MD Taking Active   chlorhexidine  (PERIDEX ) 0.12 % solution [id number] No Use as directed 15 mLs in the mouth or throat 2 (two) times daily as needed. Swish and spit twice daily as needed Mardene Shake, FNP  Taking Active   Cholecalciferol  (VITAMIN D3) 10 MCG (400 UNIT) tablet [id number]  Take 1 tablet (400 Units total) by mouth daily. Newlin, Enobong, MD  Active   cyclobenzaprine  (FLEXERIL ) 5 MG tablet [id number]  Take 1 tablet (5 mg total) by mouth 3 (three) times daily as needed for muscle spasms. Dulce Gibbs M, PA-C  Active   FLUoxetine  (PROZAC ) 40 MG capsule [id number]  Take 1 capsule (40 mg  total) by mouth daily. [place], Genevive Ket  Active   fluticasone  (FLONASE ) 50 MCG/ACT nasal spray [id number] No Place 2 sprays into both nostrils daily. Dulce Gibbs M, PA-C Taking Active   gabapentin  (NEURONTIN ) 100 MG capsule [id number]  Take 1 capsule (100 mg total) by mouth 2 (two) times daily. Jaylianna, Tatlock M, PA-C  Active   indomethacin  (INDOCIN ) 50 MG capsule [id number]  Take 1 capsule (50 mg total) by mouth 2 (two) times daily with a meal. Shauna Del, DO  Active   methylPREDNISolone  acetate (DEPO-MEDROL ) injection 40 mg [id number]   Bridget Campion, MD  Active   omeprazole  (PRILOSEC) 40 MG capsule [id number] No Take 1 capsule (40 mg total) by mouth daily. Newlin, Enobong, MD Taking Active   prazosin  (MINIPRESS ) 1 MG capsule [id number]  Take 1 capsule (1 mg total) by mouth at bedtime. [place], Genevive Ket  Active             Recommendation:   Pt is encouraged to contact food pantries provided [employer] food pantry, out of the garden and second harvest food bank. Pt provided information for [employer] and [employer] utility payment assistance. Pt provided a list of dental providers accepting new patients for dental services.   Follow Up Plan:   Closing From:  Complex Care Management  Fletcher Humble MSW, LCSW Licensed Clinical Social Worker  [facility], Population Health Direct Dial: [phone]  Fax: [phone]

## 2023-10-29 NOTE — Progress Notes (Signed)
 Pain Scale   Average Pain 9  Patient states back pain when walking, sitting, and standing.  Back pain radiates down into in the left leg.  Numbness/tingling in the bottom of her left foot. Tylenol  Gabapentin  Flexeril    +Driver, -BT, -Dye Allergies.

## 2023-10-29 NOTE — Patient Instructions (Signed)
 Visit Information  Ms. Pinkney was given information about Medicaid Managed Care team care coordination services as a part of their [employer] benefit. If you are experiencing a medical emergency, please call 911 or report to your local emergency department or urgent care.   If you have a non-emergency medical problem during routine business hours, please contact your provider's office and ask to speak with a nurse.   For questions related to your [employer] [facility] health plan, please call: [phone]  OR visit the member homepage at: [email or web address]  If you would like to schedule transportation through your [employer] [facility] plan, please call the following number at least 2 days in advance of your appointment: [phone]  If you are experiencing a behavioral health crisis, call the [employer] [facility]  Behavioral Health Crisis Line at [phone]). The line is available 24 hours a day, seven days a week.  If you would like help to quit smoking, call 1-800-QUIT-NOW (919-393-9773) OR Espaol: 1-855-Djelo-Ya (2-440-102-7253) o para ms informacin haga clic aqu or Text READY to 664-403 to register via text   Ms. Thorp - following are the goals we discussed in your visit today:    Goals Addressed   None     Please see education materials related to community resources provided by MyChart link.  Patient verbalizes understanding of instructions and care plan provided today and agrees to view in MyChart. Active MyChart status and patient understanding of how to access instructions and care plan via MyChart confirmed with patient.     No further follow requested.   Fletcher Humble MSW, LCSW Licensed Clinical Social Worker  [facility], Population Health Direct Dial: [phone]  Fax: [phone]   Following is a copy of your plan of  care:  There are no care plans that you recently modified to display for this patient.

## 2023-10-30 ENCOUNTER — Other Ambulatory Visit: Payer: Self-pay

## 2023-10-31 ENCOUNTER — Encounter (HOSPITAL_COMMUNITY): Payer: Self-pay

## 2023-10-31 ENCOUNTER — Other Ambulatory Visit (HOSPITAL_COMMUNITY): Payer: Self-pay

## 2023-11-02 ENCOUNTER — Ambulatory Visit: Admitting: Physical Medicine and Rehabilitation

## 2023-11-02 DIAGNOSIS — R202 Paresthesia of skin: Secondary | ICD-10-CM | POA: Diagnosis not present

## 2023-11-02 NOTE — Progress Notes (Signed)
 Crystal Koch - 43 y.o. female MRN [id number]  Date of birth: 03-Feb-1981  Office Visit Note: Visit Date: 10/29/2023 PCP: Joaquin Mulberry, MD Referred by: Joaquin Mulberry, MD  Subjective: Chief Complaint  Patient presents with   Lower Back - Pain   HPI:  Crystal Koch is a 43 y.o. female who comes in today at the request of Crystal Hamel, FNP for planned Left S1-2 Lumbar Transforaminal epidural steroid injection with fluoroscopic guidance.  The patient has failed conservative care including home exercise, medications, time and activity modification.  This injection will be diagnostic and hopefully therapeutic.  Please see requesting physician notes for further details and justification.   ROS Otherwise per HPI.  Assessment & Plan: Visit Diagnoses:    ICD-10-CM   1. Lumbar radiculopathy  M54.16 methylPREDNISolone  acetate (DEPO-MEDROL ) injection 40 mg    XR C-ARM NO REPORT    Epidural Steroid injection      Plan: No additional findings.   Meds & Orders:  Meds ordered this encounter  Medications   methylPREDNISolone  acetate (DEPO-MEDROL ) injection 40 mg    Orders Placed This Encounter  Procedures   XR C-ARM NO REPORT   Epidural Steroid injection    Follow-up: Return for visit to requesting provider as needed.   Procedures: No procedures performed  S1 Lumbosacral Transforaminal Epidural Steroid Injection - Sub-Pedicular Approach with Fluoroscopic Guidance   Patient: Crystal Koch      Date of Birth: [birth year] MRN: [id number] PCP: Joaquin Mulberry, MD      Visit Date: 10/29/2023   Universal Protocol:    Date/Time: 11/01/2508:34 AM  Consent Given By: the patient  Position:  PRONE  Additional Comments: Vital signs were monitored before and after the procedure. Patient was prepped and draped in the usual sterile fashion. The correct patient, procedure, and site was verified.   Injection Procedure Details:  Procedure Site One Meds Administered:  Meds ordered this  encounter  Medications   methylPREDNISolone  acetate (DEPO-MEDROL ) injection 40 mg    Laterality: Left  Location/Site:  S1 Foramen   Needle size: 22 ga.  Needle type: Spinal  Needle Placement: Transforaminal  Findings:   -Comments: Excellent flow of contrast along the nerve, nerve root and into the epidural space.  Epidurogram: Contrast epidurogram showed no nerve root cut off or restricted flow pattern.  Procedure Details: After squaring off the sacral end-plate to get a true AP view, the C-arm was positioned so that the best possible view of the S1 foramen was visualized. The soft tissues overlying this structure were infiltrated with 2-3 ml. of 1% Lidocaine without Epinephrine.    The spinal needle was inserted toward the target using a "trajectory" view along the fluoroscope beam.  Under AP and lateral visualization, the needle was advanced so it did not puncture dura. Biplanar projections were used to confirm position. Aspiration was confirmed to be negative for CSF and/or blood. A 1-2 ml. volume of Isovue-250 was injected and flow of contrast was noted at each level. Radiographs were obtained for documentation purposes.   After attaining the desired flow of contrast documented above, a 0.5 to 1.0 ml test dose of 0.25% Marcaine was injected into each respective transforaminal space.  The patient was observed for 90 seconds post injection.  After no sensory deficits were reported, and normal lower extremity motor function was noted,   the above injectate was administered so that equal amounts of the injectate were placed at each foramen (level) into the transforaminal epidural space.   Additional  Comments:  The patient tolerated the procedure well Dressing: Band-Aid with 2 x 2 sterile gauze    Post-procedure details: Patient was observed during the procedure. Post-procedure instructions were reviewed.  Patient left the clinic in stable condition.   Clinical  History: Narrative & Impression CLINICAL DATA:  Low back pain, symptoms persist with > 6 wks treatment   EXAM: MRI LUMBAR SPINE WITHOUT CONTRAST   TECHNIQUE: Multiplanar, multisequence MR imaging of the lumbar spine was performed. No intravenous contrast was administered.   COMPARISON:  September 2023   FINDINGS: Segmentation:  Normal.   Alignment:  Stable and without listhesis.   Vertebrae: Stable and preserved vertebral body heights. No marrow edema. No suspicious lesion.   Conus medullaris and cauda equina: Conus extends to the L1-L2 level. Conus and cauda equina appear normal.   Paraspinal and other soft tissues: No new finding. Fibroid at the fundus of the uterus.   Disc levels: Intervertebral disc heights and signal are maintained. Persistent mild disc bulge at L5-S1 with superimposed left central/subarticular protrusion. No canal or left foraminal stenosis. Minor right foraminal stenosis. Narrowing of the left subarticular recess with potential compression of the traversing left S1 nerve root.   IMPRESSION: Disc protrusion at L5-S1 as seen on the prior study with potential compression of traversing left S1 nerve root.     Electronically Signed   By: Geannie Keener M.D.   On: 07/01/2023 15:56     Objective:  VS:  HT:    WT:   BMI:     BP:(!) 144/95  HR:(!) 105bpm  TEMP: ( )  RESP:  Physical Exam Vitals and nursing note reviewed.  Constitutional:      General: She is not in acute distress.    Appearance: Normal appearance. She is not ill-appearing.  HENT:     Head: Normocephalic and atraumatic.     Right Ear: External ear normal.     Left Ear: External ear normal.  Eyes:     Extraocular Movements: Extraocular movements intact.  Cardiovascular:     Rate and Rhythm: Normal rate.     Pulses: Normal pulses.  Pulmonary:     Effort: Pulmonary effort is normal. No respiratory distress.  Abdominal:     General: There is no distension.      Palpations: Abdomen is soft.  Musculoskeletal:        General: Tenderness present.     Cervical back: Neck supple.     Right lower leg: No edema.     Left lower leg: No edema.     Comments: Patient has good distal strength with no pain over the greater trochanters.  No clonus or focal weakness.  Skin:    Findings: No erythema, lesion or rash.  Neurological:     General: No focal deficit present.     Mental Status: She is alert and oriented to person, place, and time.     Sensory: No sensory deficit.     Motor: No weakness or abnormal muscle tone.     Coordination: Coordination normal.  Psychiatric:        Mood and Affect: Mood normal.        Behavior: Behavior normal.      Imaging: No results found.

## 2023-11-02 NOTE — Progress Notes (Unsigned)
 Painscale: 5  Right handed  Middle fingers on both hands, tingling in hands when she first wake up and end of work day.

## 2023-11-02 NOTE — Progress Notes (Unsigned)
 Crystal Koch - 43 y.o. female MRN [id number]  Date of birth: 1980/12/25  Office Visit Note: Visit Date: 11/02/2023 PCP: Joaquin Mulberry, MD Referred by: Shauna Del, DO  Subjective: Chief Complaint  Patient presents with   Right Hand - Tingling   Left Hand - Tingling   HPI: Crystal Koch is a 43 y.o. female who comes in today at the request of Dr. Shauna Del for evaluation and management of chronic, worsening and severe pain, numbness and tingling in the Bilateral upper extremities.  Patient is Right hand dominant.  bilateral hand pain with pain and stiffness in the third finger as well as numbness and tingling of each hand and fingers.   She has pain in the palmar aspect of the hand mostly in the third finger bilaterally.  She does report previous episodes of triggering where she would have to active or passively open the third digit bilaterally.  This has not happened since January.  Her symptoms have been worsening since January of this year.  They are worse in the morning with associated swelling.  She also however gets numbness and tingling in all the fingers but worse in the 3rd and 4th digit.  Gabapentin  does help this to some extent.  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  She is having difficulty with closed fist.   She does use Gabapentin  100mg  TID, which does help her symptoms. She also is using Meloxicam  15mg  every day which she does not feel is that helpful anymore.  She does run her hands under hot water which will help.        ROS Otherwise per HPI.  Assessment & Plan: Visit Diagnoses:    ICD-10-CM   1. Paresthesia of skin  R20.2 NCV with EMG (electromyography)       Plan: No additional findings.   Meds & Orders: No orders of the defined types were placed in this encounter.   Orders Placed This Encounter  Procedures   NCV with EMG (electromyography)     Follow-up: No follow-ups on file.   Procedures: No procedures performed      Clinical History: Narrative & Impression CLINICAL DATA:  Low back pain, symptoms persist with > 6 wks treatment   EXAM: MRI LUMBAR SPINE WITHOUT CONTRAST   TECHNIQUE: Multiplanar, multisequence MR imaging of the lumbar spine was performed. No intravenous contrast was administered.   COMPARISON:  September 2023   FINDINGS: Segmentation:  Normal.   Alignment:  Stable and without listhesis.   Vertebrae: Stable and preserved vertebral body heights. No marrow edema. No suspicious lesion.   Conus medullaris and cauda equina: Conus extends to the L1-L2 level. Conus and cauda equina appear normal.   Paraspinal and other soft tissues: No new finding. Fibroid at the fundus of the uterus.   Disc levels: Intervertebral disc heights and signal are maintained. Persistent mild disc bulge at L5-S1 with superimposed left central/subarticular protrusion. No canal or left foraminal stenosis. Minor right foraminal stenosis. Narrowing of the left subarticular recess with potential compression of the traversing left S1 nerve root.   IMPRESSION: Disc protrusion at L5-S1 as seen on the prior study with potential compression of traversing left S1 nerve root.     Electronically Signed   By: Geannie Keener M.D.   On: 07/01/2023 15:56   She reports that she quit smoking about 35 years ago. Her smoking use included cigarettes.  She started smoking about 4 weeks ago. She has a 4 pack-year smoking history. She has never used smokeless tobacco. No results for input(s): "HGBA1C", "LABURIC" in the last 8760 hours.  Objective:  VS:  HT:    WT:   BMI:     BP:   HR: bpm  TEMP: ( )  RESP:  Physical Exam  Ortho Exam  Imaging: No results found.  Past Medical/Family/Surgical/Social History: Medications & Allergies reviewed per EMR, new medications updated. Patient Active Problem List   Diagnosis Date Noted    PTSD (post-traumatic stress disorder) 09/03/2023   GAD (generalized anxiety disorder) 09/03/2023   MDD (major depressive disorder), recurrent, in full remission (HCC) 09/03/2023   Use of cannabis 09/03/2023   Anxiety 05/16/2023   Positive depression screening 04/18/2023   Lumbar radiculopathy 04/18/2023   Acute non-recurrent sinusitis 05/31/2021   Vitamin D  deficiency 06/22/2014   Sciatica of left side 06/19/2014   Current smoker 06/19/2014   Past Medical History:  Diagnosis Date   Asthma 06/12/1984   Depression    PTSD (post-traumatic stress disorder)    Sciatica 06/12/2009   since son was born. exacerbated every winter.    Family History  Problem Relation Age of Onset   Thyroid disease Mother    Mental illness Mother    Cancer Father    Hypertension Maternal Uncle    Cancer Maternal Grandmother    Diabetes Neg Hx    Past Surgical History:  Procedure Laterality Date   CESAREAN SECTION  2011   Social History   Occupational History   Occupation: Homemaker   Tobacco Use   Smoking status: Former    Average packs/day: 0.3 packs/day for 16.0 years (4.0 ttl pk-yrs)    Types: Cigarettes    Start date: 10/03/2023    Quit date: 06/12/1988    Years since quitting: 35.4   Smokeless tobacco: Never  Vaping Use   Vaping status: Never Used  Substance and Sexual Activity   Alcohol use: No   Drug use: Yes    Types: Marijuana    Comment: prior microdosing of mushrooms twice weekly recently stopped as of 09/11/23. Marijuana: current use every night after work 1 joint   Sexual activity: Yes    Partners: Male    Birth control/protection: None

## 2023-11-02 NOTE — Procedures (Signed)
 S1 Lumbosacral Transforaminal Epidural Steroid Injection - Sub-Pedicular Approach with Fluoroscopic Guidance   Patient: Crystal Koch      Date of Birth: [birth year] MRN: [id number] PCP: Joaquin Mulberry, MD      Visit Date: 10/29/2023   Universal Protocol:    Date/Time: 11/01/2508:34 AM  Consent Given By: the patient  Position:  PRONE  Additional Comments: Vital signs were monitored before and after the procedure. Patient was prepped and draped in the usual sterile fashion. The correct patient, procedure, and site was verified.   Injection Procedure Details:  Procedure Site One Meds Administered:  Meds ordered this encounter  Medications   methylPREDNISolone  acetate (DEPO-MEDROL ) injection 40 mg    Laterality: Left  Location/Site:  S1 Foramen   Needle size: 22 ga.  Needle type: Spinal  Needle Placement: Transforaminal  Findings:   -Comments: Excellent flow of contrast along the nerve, nerve root and into the epidural space.  Epidurogram: Contrast epidurogram showed no nerve root cut off or restricted flow pattern.  Procedure Details: After squaring off the sacral end-plate to get a true AP view, the C-arm was positioned so that the best possible view of the S1 foramen was visualized. The soft tissues overlying this structure were infiltrated with 2-3 ml. of 1% Lidocaine without Epinephrine.    The spinal needle was inserted toward the target using a "trajectory" view along the fluoroscope beam.  Under AP and lateral visualization, the needle was advanced so it did not puncture dura. Biplanar projections were used to confirm position. Aspiration was confirmed to be negative for CSF and/or blood. A 1-2 ml. volume of Isovue-250 was injected and flow of contrast was noted at each level. Radiographs were obtained for documentation purposes.   After attaining the desired flow of contrast documented above, a 0.5 to 1.0 ml test dose of 0.25% Marcaine was injected into each  respective transforaminal space.  The patient was observed for 90 seconds post injection.  After no sensory deficits were reported, and normal lower extremity motor function was noted,   the above injectate was administered so that equal amounts of the injectate were placed at each foramen (level) into the transforaminal epidural space.   Additional Comments:  The patient tolerated the procedure well Dressing: Band-Aid with 2 x 2 sterile gauze    Post-procedure details: Patient was observed during the procedure. Post-procedure instructions were reviewed.  Patient left the clinic in stable condition.

## 2023-11-08 NOTE — Procedures (Signed)
 EMG & NCV Findings: Evaluation of the left median (across palm) sensory and the right median (across palm) sensory nerves showed no response (Palm).  All remaining nerves (as indicated in the following tables) were within normal limits.  All left vs. right side differences were within normal limits.    All examined muscles (as indicated in the following table) showed no evidence of electrical instability.    Impression: Essentially NORMAL electrodiagnostic study of both upper limbs.  There is no significant electrodiagnostic evidence of nerve entrapment, brachial plexopathy or cervical radiculopathy.    As you know, purely sensory or demyelinating radiculopathies and chemical radiculitis may not be detected with this particular electrodiagnostic study. **This electrodiagnostic study cannot rule out small fiber polyneuropathy and dysesthesias from central pain syndromes such as stroke or central pain sensitization syndromes such as fibromyalgia.  Myotomal referral pain from trigger points is also not excluded.  Recommendations: 1.  Follow-up with referring physician. 2.  Continue current management of symptoms.  ___________________________ Collin Deal FAAPMR Board Certified, American Board of Physical Medicine and Rehabilitation    Nerve Conduction Studies Anti Sensory Summary Table   Stim Site NR Peak (ms) Norm Peak (ms) P-T Amp (V) Norm P-T Amp Site1 Site2 Delta-P (ms) Dist (cm) Vel (m/s) Norm Vel (m/s)  Left Median Acr Palm Anti Sensory (2nd Digit)  30.8C  Wrist    2.7 <3.6 62.5 >10 Wrist Palm  0.0    Palm *NR  <2.0          Right Median Acr Palm Anti Sensory (2nd Digit)  30.5C  Wrist    2.9 <3.6 41.6 >10 Wrist Palm  0.0    Palm *NR  <2.0          Left Radial Anti Sensory (Base 1st Digit)  31.4C  Wrist    1.9 <3.1 51.4  Wrist Base 1st Digit 1.9 0.0    Right Radial Anti Sensory (Base 1st Digit)  31.7C  Wrist    1.9 <3.1 50.6  Wrist Base 1st Digit 1.9 0.0    Left Ulnar Anti  Sensory (5th Digit)  31.4C  Wrist    3.2 <3.7 24.3 >15.0 Wrist 5th Digit 3.2 14.0 44 >38  Right Ulnar Anti Sensory (5th Digit)  32C  Wrist    2.9 <3.7 29.2 >15.0 Wrist 5th Digit 2.9 14.0 48 >38   Motor Summary Table   Stim Site NR Onset (ms) Norm Onset (ms) O-P Amp (mV) Norm O-P Amp Site1 Site2 Delta-0 (ms) Dist (cm) Vel (m/s) Norm Vel (m/s)  Left Median Motor (Abd Poll Brev)  31.8C  Wrist    2.5 <4.2 8.3 >5 Elbow Wrist 3.4 20.0 59 >50  Elbow    5.9  8.5         Right Median Motor (Abd Poll Brev)  32C  Wrist    2.7 <4.2 9.2 >5 Elbow Wrist 3.6 20.0 56 >50  Elbow    6.3  9.3         Left Ulnar Motor (Abd Dig Min)  32C  Wrist    2.3 <4.2 8.2 >3 B Elbow Wrist 2.9 18.0 62 >53  B Elbow    5.2  8.2  A Elbow B Elbow 1.0 9.0 90 >53  A Elbow    6.2  8.2         Right Ulnar Motor (Abd Dig Min)  32.2C  Wrist    2.3 <4.2 7.5 >3 B Elbow Wrist 2.9 19.0 66 >53  B Elbow  5.2  8.0  A Elbow B Elbow 1.1 9.5 86 >53  A Elbow    6.3  8.2          EMG   Side Muscle Nerve Root Ins Act Fibs Psw Amp Dur Poly Recrt Int Deatra Face Comment  Left Abd Poll Brev Median C8-T1 Nml Nml Nml Nml Nml 0 Nml Nml   Left 1stDorInt Ulnar C8-T1 Nml Nml Nml Nml Nml 0 Nml Nml   Left PronatorTeres Median C6-7 Nml Nml Nml Nml Nml 0 Nml Nml   Left Biceps Musculocut C5-6 Nml Nml Nml Nml Nml 0 Nml Nml   Left Deltoid Axillary C5-6 Nml Nml Nml Nml Nml 0 Nml Nml   Right Abd Poll Brev Median C8-T1 Nml Nml Nml Nml Nml 0 Nml Nml   Right 1stDorInt Ulnar C8-T1 Nml Nml Nml Nml Nml 0 Nml Nml   Right PronatorTeres Median C6-7 Nml Nml Nml Nml Nml 0 Nml Nml   Right Biceps Musculocut C5-6 Nml Nml Nml Nml Nml 0 Nml Nml   Right Deltoid Axillary C5-6 Nml Nml Nml Nml Nml 0 Nml Nml     Nerve Conduction Studies Anti Sensory Left/Right Comparison   Stim Site L Lat (ms) R Lat (ms) L-R Lat (ms) L Amp (V) R Amp (V) L-R Amp (%) Site1 Site2 L Vel (m/s) R Vel (m/s) L-R Vel (m/s)  Median Acr Palm Anti Sensory (2nd Digit)  30.8C  Wrist 2.7 2.9 0.2  62.5 41.6 33.4 Wrist Palm     Palm             Radial Anti Sensory (Base 1st Digit)  31.4C  Wrist 1.9 1.9 0.0 51.4 50.6 1.6 Wrist Base 1st Digit     Ulnar Anti Sensory (5th Digit)  31.4C  Wrist 3.2 2.9 0.3 24.3 29.2 16.8 Wrist 5th Digit 44 48 4   Motor Left/Right Comparison   Stim Site L Lat (ms) R Lat (ms) L-R Lat (ms) L Amp (mV) R Amp (mV) L-R Amp (%) Site1 Site2 L Vel (m/s) R Vel (m/s) L-R Vel (m/s)  Median Motor (Abd Poll Brev)  31.8C  Wrist 2.5 2.7 0.2 8.3 9.2 9.8 Elbow Wrist 59 56 3  Elbow 5.9 6.3 0.4 8.5 9.3 8.6       Ulnar Motor (Abd Dig Min)  32C  Wrist 2.3 2.3 0.0 8.2 7.5 8.5 B Elbow Wrist 62 66 4  B Elbow 5.2 5.2 0.0 8.2 8.0 2.4 A Elbow B Elbow 90 86 4  A Elbow 6.2 6.3 0.1 8.2 8.2 0.0          Waveforms:

## 2023-11-12 ENCOUNTER — Ambulatory Visit (INDEPENDENT_AMBULATORY_CARE_PROVIDER_SITE_OTHER): Admitting: Licensed Clinical Social Worker

## 2023-11-12 DIAGNOSIS — F4310 Post-traumatic stress disorder, unspecified: Secondary | ICD-10-CM | POA: Diagnosis not present

## 2023-11-12 DIAGNOSIS — F411 Generalized anxiety disorder: Secondary | ICD-10-CM

## 2023-11-12 DIAGNOSIS — F3342 Major depressive disorder, recurrent, in full remission: Secondary | ICD-10-CM

## 2023-11-12 NOTE — Progress Notes (Addendum)
 THERAPIST PROGRESS NOTE  Virtual Visit via Video Note  I connected with Arlet Laraia on 11/12/23 at 11:00 AM EDT by a video enabled telemedicine application and verified that I am speaking with the correct person using two identifiers.  Location: Patient: [facility]  Provider: Providers Home    I discussed the limitations of evaluation and management by telemedicine and the availability of in person appointments. The patient expressed understanding and agreed to proceed.     I discussed the assessment and treatment plan with the patient. The patient was provided an opportunity to ask questions and all were answered. The patient agreed with the plan and demonstrated an understanding of the instructions.   The patient was advised to call back or seek an in-person evaluation if the symptoms worsen or if the condition fails to improve as anticipated.  I provided 30 minutes of non-face-to-face time during this encounter.   Juliene GORMAN Patee, LCSW   Participation Level: Active  Behavioral Response: CasualAlertAnxious and Depressed  Type of Therapy: Individual Therapy  Treatment Goals addressed:  Active     Anxiety     LTG: Mariposa will score less than 5 on the Generalized Anxiety Disorder 7 Scale (GAD-7)  (Progressing)     Start:  10/09/23    Expected End:  04/11/24         STG: Clayborne will participate in at least 80% of scheduled individual psychotherapy sessions  (Progressing)     Start:  10/09/23    Expected End:  04/11/24         STG: Clayborne will complete at least 80% of assigned homework  (Progressing)     Start:  10/09/23    Expected End:  04/11/24         STG: Clayborne will practice problem solving skills 3 times per week for the next 4 weeks.  (Progressing)     Start:  10/09/23    Expected End:  04/11/24         STG: Report a decrease in anxiety symptoms as evidenced by an overall reduction in anxiety score by a minimum of 25% on the Generalized Anxiety  Disorder Scale (GAD-7) (Progressing)     Start:  10/09/23    Expected End:  04/11/24         Review results of GAD-7 with Clayborne to track progress     Start:  10/09/23         Perform psychoeducation regarding anxiety disorders     Start:  10/09/23         Provide Clayborne with educational information and reading material on anxiety, its causes, and symptoms.      Start:  10/09/23         Discuss risks and benefits of medication treatment options for this problem and prescribe as indicated (Completed)     Start:  10/09/23    End:  11/12/23      Encourage Clayborne to take psychotropic medication(s) as prescribed (Completed)     Start:  10/09/23    End:  11/12/23      Work with Clayborne to track symptoms, triggers, and/or skill use through a mood chart, diary card, or journal (Completed)     Start:  10/09/23    End:  11/12/23         ProgressTowards Goals: Progressing  Interventions: CBT, Motivational Interviewing, and Supportive   Suicidal/Homicidal: Nowithout intent/plan  Therapist Response:     Tykeria was alert and oriented x 5.  She was pleasant, cooperative, maintained good eye contact.  She engaged well in therapy session and was dressed casually.  She presented with euthymic mood\affect  Patient comes in 15 minutes late to the session.  LCSW had to follow up with a phone call in order for patient to enter session.  Wieand apologized for being late to session and stated that she was traveling from [place]  to [facility]  late last night and lost track of time when after she got home and fell asleep.  Patient reports that she has had an overall decrease in depression and anxiety symptoms.  She reports that things have been going much better since taking her Wellbutrin .  Buhrman states that she has stopped taking her Prozac  as the combination between Prozac  and Wellbutrin  caused her to focus too much.  She reports that she has decreased her smoking 2 0 and has  not smoked a cigarette in over 4 weeks.  Patient states that things have been going well and she has been managing her time between 3 jobs and caring for her child well.  Intervention/plan: LCSW utilized psychoanalytic therapy for patient to express thoughts, feelings and emotions and nonjudgmental environment.  LCSW utilized supportive therapy for praise and encouragement.  LCSW educated patient on taking medications as prescribed and consistently.  LCSW 1 over symptoms for anxiety and depression such as tension and worry.  LCSW administered a GAD-7.  LCSW administered PHQ-9.  LCSW reviewed GAD-7 and PHQ-9 with patient.  Plan: Return again in 4 weeks.  Diagnosis: PTSD (post-traumatic stress disorder)  GAD (generalized anxiety disorder)  MDD (major depressive disorder), recurrent, in full remission (HCC)  Collaboration of Care: Other None today   Patient/Guardian was advised Release of Information must be obtained prior to any record release in order to collaborate their care with an outside provider. Patient/Guardian was advised if they have not already done so to contact the registration department to sign all necessary forms in order for us  to release information regarding their care.   Consent: Patient/Guardian gives verbal consent for treatment and assignment of benefits for services provided during this visit. Patient/Guardian expressed understanding and agreed to proceed.   Juliene GORMAN Patee, LCSW 11/12/2023

## 2023-11-19 ENCOUNTER — Encounter

## 2023-11-20 NOTE — Telephone Encounter (Signed)
 Patient no showed her appt from 11/19/23 she called in stating she lost her wallet.   She is r/s for 12/30/23 at 8 pm.  Mailed new packet and mychart.

## 2023-11-29 ENCOUNTER — Telehealth: Admitting: Physician Assistant

## 2023-11-29 DIAGNOSIS — K047 Periapical abscess without sinus: Secondary | ICD-10-CM | POA: Diagnosis not present

## 2023-11-29 DIAGNOSIS — Z5321 Procedure and treatment not carried out due to patient leaving prior to being seen by health care provider: Secondary | ICD-10-CM

## 2023-11-29 MED ORDER — FLUCONAZOLE 150 MG PO TABS
ORAL_TABLET | ORAL | 0 refills | Status: DC
Start: 2023-11-29 — End: 2024-03-31

## 2023-11-29 MED ORDER — NAPROXEN 500 MG PO TABS: 500.0000 mg | ORAL_TABLET | Freq: Two times a day (BID) | ORAL | 0 refills | Status: DC

## 2023-11-29 MED ORDER — AMOXICILLIN-POT CLAVULANATE 875-125 MG PO TABS: 1.0000 | ORAL_TABLET | Freq: Two times a day (BID) | ORAL | 0 refills | Status: DC

## 2023-11-29 MED ORDER — CHLORHEXIDINE GLUCONATE 0.12 % MT SOLN: 15.0000 mL | Freq: Two times a day (BID) | OROMUCOSAL | 0 refills | Status: AC | PRN

## 2023-11-29 NOTE — Progress Notes (Signed)
 Virtual Visit Consent   Crystal Koch, you are scheduled for a virtual visit with a [facility] provider today. Just as with appointments in the office, your consent must be obtained to participate. Your consent will be active for this visit and any virtual visit you may have with one of our providers in the next 365 days. If you have a MyChart account, a copy of this consent can be sent to you electronically.  As this is a virtual visit, video technology does not allow for your provider to perform a traditional examination. This may limit your provider's ability to fully assess your condition. If your provider identifies any concerns that need to be evaluated in person or the need to arrange testing (such as labs, EKG, etc.), we will make arrangements to do so. Although advances in technology are sophisticated, we cannot ensure that it will always work on either your end or our end. If the connection with a video visit is poor, the visit may have to be switched to a telephone visit. With either a video or telephone visit, we are not always able to ensure that we have a secure connection.  By engaging in this virtual visit, you consent to the provision of healthcare and authorize for your insurance to be billed (if applicable) for the services provided during this visit. Depending on your insurance coverage, you may receive a charge related to this service.  I need to obtain your verbal consent now. Are you willing to proceed with your visit today? Crystal Koch has provided verbal consent on 11/29/2023 for a virtual visit (video or telephone). Hyla Maillard, [place]  Date: 11/29/2023 8:25 AM   Virtual Visit via Video Note   I, Hyla Maillard, connected with  Crystal Koch  ([id number], 1981-05-07) on 11/29/23 at  8:30 AM EDT by a video-enabled telemedicine application and verified that I am speaking with the correct person using two identifiers.  Location: Patient: Virtual Visit Location  Patient: Home Provider: Virtual Visit Location Provider: Home Office   I discussed the limitations of evaluation and management by telemedicine and the availability of in person appointments. The patient expressed understanding and agreed to proceed.    History of Present Illness: Crystal Koch is a 43 y.o. who identifies as a female who was assigned female at birth, and is being seen today for possible dental abscess. Endorses symptoms starting last night -- R incisor. PAin currently 8/10. Increased swelling of gum. Denies fever, chills. Scheduling appt dentist.   HPI: HPI  Problems:  Patient Active Problem List   Diagnosis Date Noted   PTSD (post-traumatic stress disorder) 09/03/2023   GAD (generalized anxiety disorder) 09/03/2023   MDD (major depressive disorder), recurrent, in full remission (HCC) 09/03/2023   Use of cannabis 09/03/2023   Anxiety 05/16/2023   Positive depression screening 04/18/2023   Lumbar radiculopathy 04/18/2023   Acute non-recurrent sinusitis 05/31/2021   Vitamin D  deficiency 06/22/2014   Sciatica of left side 06/19/2014   Current smoker 06/19/2014    Allergies: No Known Allergies Medications:  Current Outpatient Medications:    amoxicillin -clavulanate (AUGMENTIN ) 875-125 MG tablet, Take 1 tablet by mouth 2 (two) times daily., Disp: 14 tablet, Rfl: 0   naproxen  (NAPROSYN ) 500 MG tablet, Take 1 tablet (500 mg total) by mouth 2 (two) times daily with a meal., Disp: 20 tablet, Rfl: 0   atorvastatin  (LIPITOR) 20 MG tablet, Take 1 tablet (20 mg total) by mouth daily., Disp: 90 tablet, Rfl: 1  buPROPion  (WELLBUTRIN  XL) 150 MG 24 hr tablet, Take 1 tablet (150 mg total) by mouth daily for smoking cessation, Disp: 90 tablet, Rfl: 0   chlorhexidine  (PERIDEX ) 0.12 % solution, Use as directed 15 mLs in the mouth or throat 2 (two) times daily as needed. Swish and spit twice daily as needed, Disp: 120 mL, Rfl: 0   Cholecalciferol  (VITAMIN D3) 10 MCG (400 UNIT) tablet, Take  1 tablet (400 Units total) by mouth daily., Disp: 100 tablet, Rfl: 1   cyclobenzaprine  (FLEXERIL ) 5 MG tablet, Take 1 tablet (5 mg total) by mouth 3 (three) times daily as needed for muscle spasms., Disp: 30 tablet, Rfl: 2   FLUoxetine  (PROZAC ) 40 MG capsule, Take 1 capsule (40 mg total) by mouth daily., Disp: 90 capsule, Rfl: 0   fluticasone  (FLONASE ) 50 MCG/ACT nasal spray, Place 2 sprays into both nostrils daily., Disp: 16 g, Rfl: 6   gabapentin  (NEURONTIN ) 100 MG capsule, Take 1 capsule (100 mg total) by mouth 2 (two) times daily., Disp: 60 capsule, Rfl: 3   omeprazole  (PRILOSEC) 40 MG capsule, Take 1 capsule (40 mg total) by mouth daily., Disp: 90 capsule, Rfl: 1   prazosin  (MINIPRESS ) 1 MG capsule, Take 1 capsule (1 mg total) by mouth at bedtime., Disp: 90 capsule, Rfl: 0  Observations/Objective: Patient is well-developed, well-nourished in no acute distress.  Resting comfortably at home.  Head is normocephalic, atraumatic.  No labored breathing. Speech is clear and coherent with logical content.  Patient is alert and oriented at baseline.   Assessment and Plan: 1. Dental infection - amoxicillin -clavulanate (AUGMENTIN ) 875-125 MG tablet; Take 1 tablet by mouth 2 (two) times daily.  Dispense: 14 tablet; Refill: 0 - chlorhexidine  (PERIDEX ) 0.12 % solution; Use as directed 15 mLs in the mouth or throat 2 (two) times daily as needed. Swish and spit twice daily as needed  Dispense: 120 mL; Refill: 0 - naproxen  (NAPROSYN ) 500 MG tablet; Take 1 tablet (500 mg total) by mouth 2 (two) times daily with a meal.  Dispense: 20 tablet; Refill: 0  Supportive measures and OTC medications reviewed with patient.  Augmentin   and Peridex  per orders. Will send in Naprosyn  to be used along with OTC Tylenol . Patient calling dental provider now to schedule follow-up. UC/ER precautions reviewed.   Follow Up Instructions: I discussed the assessment and treatment plan with the patient. The patient was  provided an opportunity to ask questions and all were answered. The patient agreed with the plan and demonstrated an understanding of the instructions.  A copy of instructions were sent to the patient via MyChart unless otherwise noted below.    The patient was advised to call back or seek an in-person evaluation if the symptoms worsen or if the condition fails to improve as anticipated.    Hyla Maillard, PA-C

## 2023-11-29 NOTE — Patient Instructions (Signed)
 Lynna Sarks, thank you for joining Hyla Maillard, PA-C for today's virtual visit.  While this provider is not your primary care provider (PCP), if your PCP is located in our provider database this encounter information will be shared with them immediately following your visit.   A [facility] MyChart account gives you access to today's visit and all your visits, tests, and labs performed at [facility]  click here if you don't have a [facility] MyChart account or go to [email or web address]  Consent: (Patient) Crystal Koch provided verbal consent for this virtual visit at the beginning of the encounter.  Current Medications:  Current Outpatient Medications:    amoxicillin -clavulanate (AUGMENTIN ) 875-125 MG tablet, Take 1 tablet by mouth 2 (two) times daily., Disp: 14 tablet, Rfl: 0   naproxen  (NAPROSYN ) 500 MG tablet, Take 1 tablet (500 mg total) by mouth 2 (two) times daily with a meal., Disp: 20 tablet, Rfl: 0   atorvastatin  (LIPITOR) 20 MG tablet, Take 1 tablet (20 mg total) by mouth daily., Disp: 90 tablet, Rfl: 1   buPROPion  (WELLBUTRIN  XL) 150 MG 24 hr tablet, Take 1 tablet (150 mg total) by mouth daily for smoking cessation, Disp: 90 tablet, Rfl: 0   chlorhexidine  (PERIDEX ) 0.12 % solution, Use as directed 15 mLs in the mouth or throat 2 (two) times daily as needed. Swish and spit twice daily as needed, Disp: 120 mL, Rfl: 0   Cholecalciferol  (VITAMIN D3) 10 MCG (400 UNIT) tablet, Take 1 tablet (400 Units total) by mouth daily., Disp: 100 tablet, Rfl: 1   cyclobenzaprine  (FLEXERIL ) 5 MG tablet, Take 1 tablet (5 mg total) by mouth 3 (three) times daily as needed for muscle spasms., Disp: 30 tablet, Rfl: 2   FLUoxetine  (PROZAC ) 40 MG capsule, Take 1 capsule (40 mg total) by mouth daily., Disp: 90 capsule, Rfl: 0   fluticasone  (FLONASE ) 50 MCG/ACT nasal spray, Place 2 sprays into both nostrils daily., Disp: 16 g, Rfl: 6   gabapentin  (NEURONTIN ) 100 MG capsule, Take 1  capsule (100 mg total) by mouth 2 (two) times daily., Disp: 60 capsule, Rfl: 3   omeprazole  (PRILOSEC) 40 MG capsule, Take 1 capsule (40 mg total) by mouth daily., Disp: 90 capsule, Rfl: 1   prazosin  (MINIPRESS ) 1 MG capsule, Take 1 capsule (1 mg total) by mouth at bedtime., Disp: 90 capsule, Rfl: 0   Medications ordered in this encounter:  Meds ordered this encounter  Medications   amoxicillin -clavulanate (AUGMENTIN ) 875-125 MG tablet    Sig: Take 1 tablet by mouth 2 (two) times daily.    Dispense:  14 tablet    Refill:  0    Supervising Provider:   Corine Dice [[id number]]   chlorhexidine  (PERIDEX ) 0.12 % solution    Sig: Use as directed 15 mLs in the mouth or throat 2 (two) times daily as needed. Swish and spit twice daily as needed    Dispense:  120 mL    Refill:  0    Supervising Provider:   Corine Dice [[id number]]   naproxen  (NAPROSYN ) 500 MG tablet    Sig: Take 1 tablet (500 mg total) by mouth 2 (two) times daily with a meal.    Dispense:  20 tablet    Refill:  0    Supervising Provider:   Corine Dice [[id number]]     *If you need refills on other medications prior to your next appointment, please contact your pharmacy*  Follow-Up: Call back or seek an in-person evaluation  if the symptoms worsen or if the condition fails to improve as anticipated.  [facility] [phone]  Other Instructions Dental Abscess  A dental abscess is an area of pus in or around a tooth. It comes from an infection. It can cause pain and other symptoms. Treatment will help with symptoms and prevent the infection from spreading. What are the causes? This condition is caused by an infection in or around the tooth. This can be from: Very bad tooth decay (cavities). A bad injury to the tooth, such as a broken or chipped tooth. What increases the risk? The risk to get an abscess is higher in males. It is also more likely in people who: Have dental decay. Have very bad gum  disease. Eat sugary snacks between meals. Use tobacco. Have diabetes. Have a weak disease-fighting system (immune system). Do not brush their teeth regularly. What are the signs or symptoms? Some mild symptoms are: Tenderness. Bad breath. Fever. A sharp, sour taste in the mouth. Pain in and around the infected tooth. Worse symptoms of this condition include: Swollen neck glands. Chills. Pus draining around the tooth. Swelling and redness around the tooth, the mouth, or the face. Very bad pain in and around the tooth. The worst symptoms can include: Difficulty swallowing. Difficulty opening your mouth. Feeling like you may vomit or vomiting. How is this treated? This is treated by getting rid of the infection. Your dentist will discuss ways to do this, including: Antibiotic medicines. Antibacterial mouth rinse. An incision in the abscess to drain out the pus. A root canal. Removing the tooth. Follow these instructions at home: Medicines Take over-the-counter and prescription medicines only as told by your dentist. If you were prescribed an antibiotic medicine, take it as told by your dentist. Do not stop taking it even if you start to feel better. If you were prescribed a gel that has numbing medicine in it, use it exactly as told. Ask your dentist if you should avoid driving or using machines while you are taking your medicine. General instructions Rinse your mouth often with salt water. To make salt water, dissolve -1 tsp (3-6 g) of salt in 1 cup (237 mL) of warm water. Eat a soft diet while your mouth is healing. Drink enough fluid to keep your pee (urine) pale yellow. Do not apply heat to the outside of your mouth. Do not smoke or use any products that contain nicotine or tobacco. If you need help quitting, ask your dentist. Keep all follow-up visits. Prevent an abscess Brush your teeth every morning and every night. Use fluoride toothpaste. Floss your teeth each  day. Get dental cleanings as often as told by your dentist. Think about getting dental sealant put on teeth that have deep holes (decay). Drink water that has fluoride in it. Most tap water has fluoride. Check the label on bottled water to see if it has fluoride in it. Drink water instead of sugary drinks. Eat healthy meals and snacks. Wear a mouth guard or face shield when you play sports. Contact a doctor if: Your pain is worse and medicine does not help. Get help right away if: You have a fever or chills. Your symptoms suddenly get worse. You have a very bad headache. You have problems breathing or swallowing. You have trouble opening your mouth. You have swelling in your neck or close to your eye. These symptoms may be an emergency. Get help right away. Call your local emergency services (911 in  the [place] Do not wait to see if the symptoms will go away. Do not drive yourself to the hospital. Summary A dental abscess is an area of pus in or around a tooth. It is caused by an infection. Treatment will help with symptoms and prevent the infection from spreading. Take over-the-counter and prescription medicines only as told by your dentist. To prevent an abscess, take good care of your teeth. Brush your teeth every morning and night. Use floss every day. Get dental cleanings as often as told by your dentist. This information is not intended to replace advice given to you by your health care provider. Make sure you discuss any questions you have with your health care provider. Document Revised: 08/04/2020 Document Reviewed: 08/05/2020 Elsevier Patient Education  2024 [employer]   If you have been instructed to have an in-person evaluation today at a local Urgent Care facility, please use the link below. It will take you to a list of all of our available [facility], including address, phone number and hours of operation. Please do not delay care.  [facility]  If you or a family member do not have a primary care provider, use the link below to schedule a visit and establish care. When you choose a [facility] primary care physician or advanced practice provider, you gain a long-term partner in health. Find a Primary Care Provider  Learn more about [facility]'s in-office and virtual care options: [facility] - Get Care Now

## 2023-11-29 NOTE — Progress Notes (Signed)
 Patient arrived to video ahead of appt time but left before provider could join visit (8:00). Provider waited for 11 minutes after appt time without patient returning. Will close encounter, but will monitor for patient to join video later or reschedule.

## 2023-12-03 ENCOUNTER — Ambulatory Visit (HOSPITAL_COMMUNITY): Admitting: Licensed Clinical Social Worker

## 2023-12-03 DIAGNOSIS — F4310 Post-traumatic stress disorder, unspecified: Secondary | ICD-10-CM

## 2023-12-03 DIAGNOSIS — F3342 Major depressive disorder, recurrent, in full remission: Secondary | ICD-10-CM

## 2023-12-03 DIAGNOSIS — F411 Generalized anxiety disorder: Secondary | ICD-10-CM
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
## 2023-12-19 ENCOUNTER — Other Ambulatory Visit: Payer: Self-pay

## 2023-12-19 ENCOUNTER — Telehealth: Admitting: Physician Assistant

## 2023-12-19 DIAGNOSIS — K047 Periapical abscess without sinus: Secondary | ICD-10-CM

## 2023-12-19 MED ORDER — CLINDAMYCIN HCL 300 MG PO CAPS
300.0000 mg | ORAL_CAPSULE | Freq: Three times a day (TID) | ORAL | 0 refills | Status: DC
  Filled 2023-12-19: qty 21, 7d supply, fill #0
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
## 2023-12-19 NOTE — Patient Instructions (Signed)
 [name], thank you for joining [name], PA-C for today's virtual visit.  While this provider is not your primary care provider (PCP), if your PCP is located in our provider database this encounter information will be shared with them immediately following your visit.   A [facility] MyChart account gives you access to today's visit and all your visits, tests, and labs performed at [facility]  click here if you don't have a [facility] MyChart account or go to [email or web address]  Consent: (Patient) Crystal Koch provided verbal consent for this virtual visit at the beginning of the encounter.  Current Medications:  Current Outpatient Medications:    clindamycin  (CLEOCIN ) 300 MG capsule, Take 1 capsule (300 mg total) by mouth 3 (three) times daily., Disp: 21 capsule, Rfl: 0   atorvastatin  (LIPITOR) 20 MG tablet, Take 1 tablet (20 mg total) by mouth daily., Disp: 90 tablet, Rfl: 1   buPROPion  (WELLBUTRIN  XL) 150 MG 24 hr tablet, Take 1 tablet (150 mg total) by mouth daily for smoking cessation, Disp: 90 tablet, Rfl: 0   chlorhexidine  (PERIDEX ) 0.12 % solution, Use as directed 15 mLs in the mouth or throat 2 (two) times daily as needed. Swish and spit twice daily as needed, Disp: 120 mL, Rfl: 0   Cholecalciferol  (VITAMIN D3) 10 MCG (400 UNIT) tablet, Take 1 tablet (400 Units total) by mouth daily., Disp: 100 tablet, Rfl: 1   cyclobenzaprine  (FLEXERIL ) 5 MG tablet, Take 1 tablet (5 mg total) by mouth 3 (three) times daily as needed for muscle spasms., Disp: 30 tablet, Rfl: 2   fluconazole  (DIFLUCAN ) 150 MG tablet, Take 1 tablet PO once. Repeat in 3 days if needed., Disp: 2 tablet, Rfl: 0   FLUoxetine  (PROZAC ) 40 MG capsule, Take 1 capsule (40 mg total) by mouth daily., Disp: 90 capsule, Rfl: 0   fluticasone  (FLONASE ) 50 MCG/ACT nasal spray, Place 2 sprays into both nostrils daily., Disp: 16 g, Rfl: 6   gabapentin  (NEURONTIN ) 100 MG capsule, Take 1 capsule (100 mg  total) by mouth 2 (two) times daily., Disp: 60 capsule, Rfl: 3   naproxen  (NAPROSYN ) 500 MG tablet, Take 1 tablet (500 mg total) by mouth 2 (two) times daily with a meal., Disp: 20 tablet, Rfl: 0   omeprazole  (PRILOSEC) 40 MG capsule, Take 1 capsule (40 mg total) by mouth daily., Disp: 90 capsule, Rfl: 1   prazosin  (MINIPRESS ) 1 MG capsule, Take 1 capsule (1 mg total) by mouth at bedtime., Disp: 90 capsule, Rfl: 0   Medications ordered in this encounter:  Meds ordered this encounter  Medications   clindamycin  (CLEOCIN ) 300 MG capsule    Sig: Take 1 capsule (300 mg total) by mouth 3 (three) times daily.    Dispense:  21 capsule    Refill:  0    Supervising Provider:   BLAISE ALEENE KIDD [[id number]]     *If you need refills on other medications prior to your next appointment, please contact your pharmacy*  Follow-Up: Call back or seek an in-person evaluation if the symptoms worsen or if the condition fails to improve as anticipated.  [facility] [phone]  Other Instructions Please continue good oral hygiene. Take the antibiotic as directed. I also recommend starting an OTC probiotic daily.  Ok to use the Peridex . OTC Tylenol  or Naproxen  for pain and inflammation. Follow-up with PCP and dentist as scheduled. If you note any non-resolving, new, or worsening symptoms despite treatment, please seek an in-person evaluation ASAP.  If you have been instructed to have an in-person evaluation today at a local Urgent Care facility, please use the link below. It will take you to a list of all of our available [facility], including address, phone number and hours of operation. Please do not delay care.  [facility]  If you or a family member do not have a primary care provider, use the link below to schedule a visit and establish care. When you choose a [facility] primary care physician or advanced practice provider, you gain a long-term partner in  health. Find a Primary Care Provider  Learn more about [facility] in-office and virtual care options: [facility] Get Care Now

## 2023-12-19 NOTE — Progress Notes (Signed)
 Virtual Visit Consent   Crystal Koch, you are scheduled for a virtual visit with a [facility] provider today. Just as with appointments in the office, your consent must be obtained to participate. Your consent will be active for this visit and any virtual visit you may have with one of our providers in the next 365 days. If you have a MyChart account, a copy of this consent can be sent to you electronically.  As this is a virtual visit, video technology does not allow for your provider to perform a traditional examination. This may limit your provider's ability to fully assess your condition. If your provider identifies any concerns that need to be evaluated in person or the need to arrange testing (such as labs, EKG, etc.), we will make arrangements to do so. Although advances in technology are sophisticated, we cannot ensure that it will always work on either your end or our end. If the connection with a video visit is poor, the visit may have to be switched to a telephone visit. With either a video or telephone visit, we are not always able to ensure that we have a secure connection.  By engaging in this virtual visit, you consent to the provision of healthcare and authorize for your insurance to be billed (if applicable) for the services provided during this visit. Depending on your insurance coverage, you may receive a charge related to this service.  I need to obtain your verbal consent now. Are you willing to proceed with your visit today? [name] has provided verbal consent on 12/19/2023 for a virtual visit (video or telephone). Crystal Koch, [place]  Date: 12/19/2023 9:03 AM   Virtual Visit via Video Note   I, Crystal Koch, connected with  Zaylynn Rickett  ([id number], [birth year]) on 12/19/23 at  9:00 AM EDT by a video-enabled telemedicine application and verified that I am speaking with the correct person using two identifiers.  Location: Patient: Virtual Visit Location Patient:  Home Provider: Virtual Visit Location Provider: Home Office   I discussed the limitations of evaluation and management by telemedicine and the availability of in person appointments. The patient expressed understanding and agreed to proceed.    History of Present Illness: [name] is a 43 y.o. who identifies as a female who was assigned female at birth, and is being seen today for recurrence of dental abscess. Has been noting pain again in the R incisor over the past 2 days with mild swelling again of the gum. Has history of dental abscess, most recently used Augmentin . Dental appointment is still pending -- scheduled for this coming month. Has PCP appt scheduled too but could not get in until 7/23. Denies fever, chills. Endorses she is able to fully open and close the jaw.    HPI: HPI  Problems:  Patient Active Problem List   Diagnosis Date Noted   PTSD (post-traumatic stress disorder) 09/03/2023   GAD (generalized anxiety disorder) 09/03/2023   MDD (major depressive disorder), recurrent, in full remission (HCC) 09/03/2023   Use of cannabis 09/03/2023   Anxiety 05/16/2023   Positive depression screening 04/18/2023   Lumbar radiculopathy 04/18/2023   Acute non-recurrent sinusitis 05/31/2021   Vitamin D  deficiency 06/22/2014   Sciatica of left side 06/19/2014   Current smoker 06/19/2014    Allergies: No Known Allergies Medications:  Current Outpatient Medications:    clindamycin  (CLEOCIN ) 300 MG capsule, Take 1 capsule (300 mg total) by mouth 3 (three) times daily., Disp: 21 capsule, Rfl:  0   atorvastatin  (LIPITOR) 20 MG tablet, Take 1 tablet (20 mg total) by mouth daily., Disp: 90 tablet, Rfl: 1   buPROPion  (WELLBUTRIN  XL) 150 MG 24 hr tablet, Take 1 tablet (150 mg total) by mouth daily for smoking cessation, Disp: 90 tablet, Rfl: 0   chlorhexidine  (PERIDEX ) 0.12 % solution, Use as directed 15 mLs in the mouth or throat 2 (two) times daily as needed. Swish and spit twice daily as  needed, Disp: 120 mL, Rfl: 0   Cholecalciferol  (VITAMIN D3) 10 MCG (400 UNIT) tablet, Take 1 tablet (400 Units total) by mouth daily., Disp: 100 tablet, Rfl: 1   cyclobenzaprine  (FLEXERIL ) 5 MG tablet, Take 1 tablet (5 mg total) by mouth 3 (three) times daily as needed for muscle spasms., Disp: 30 tablet, Rfl: 2   fluconazole  (DIFLUCAN ) 150 MG tablet, Take 1 tablet PO once. Repeat in 3 days if needed., Disp: 2 tablet, Rfl: 0   FLUoxetine  (PROZAC ) 40 MG capsule, Take 1 capsule (40 mg total) by mouth daily., Disp: 90 capsule, Rfl: 0   fluticasone  (FLONASE ) 50 MCG/ACT nasal spray, Place 2 sprays into both nostrils daily., Disp: 16 g, Rfl: 6   gabapentin  (NEURONTIN ) 100 MG capsule, Take 1 capsule (100 mg total) by mouth 2 (two) times daily., Disp: 60 capsule, Rfl: 3   naproxen  (NAPROSYN ) 500 MG tablet, Take 1 tablet (500 mg total) by mouth 2 (two) times daily with a meal., Disp: 20 tablet, Rfl: 0   omeprazole  (PRILOSEC) 40 MG capsule, Take 1 capsule (40 mg total) by mouth daily., Disp: 90 capsule, Rfl: 1   prazosin  (MINIPRESS ) 1 MG capsule, Take 1 capsule (1 mg total) by mouth at bedtime., Disp: 90 capsule, Rfl: 0  Observations/Objective: Patient is well-developed, well-nourished in no acute distress.  Resting comfortably at home.  Head is normocephalic, atraumatic.  No labored breathing.  Speech is clear and coherent with logical content.  Patient is alert and oriented at baseline.   Assessment and Plan: 1. Dental abscess (Primary) - clindamycin  (CLEOCIN ) 300 MG capsule; Take 1 capsule (300 mg total) by mouth 3 (three) times daily.  Dispense: 21 capsule; Refill: 0  Recurrent. Dental appt scheduled. Giving recent Augmentin  use for this issue, will Rx Clindamycin  TID x 7 days. Start probiotic. OTC analgesics reviewed. Ok to continue use of Peridex  mouthrinse. UC/ER precautions reviewed.  Follow Up Instructions: I discussed the assessment and treatment plan with the patient. The patient was  provided an opportunity to ask questions and all were answered. The patient agreed with the plan and demonstrated an understanding of the instructions.  A copy of instructions were sent to the patient via MyChart unless otherwise noted below.   The patient was advised to call back or seek an in-person evaluation if the symptoms worsen or if the condition fails to improve as anticipated.    Crystal Velma Lunger, PA-C

## 2023-12-24 ENCOUNTER — Telehealth (INDEPENDENT_AMBULATORY_CARE_PROVIDER_SITE_OTHER): Admitting: Psychiatry

## 2023-12-24 ENCOUNTER — Other Ambulatory Visit (HOSPITAL_COMMUNITY): Payer: Self-pay

## 2023-12-24 ENCOUNTER — Encounter (HOSPITAL_COMMUNITY): Payer: Self-pay | Admitting: Psychiatry

## 2023-12-24 DIAGNOSIS — F3342 Major depressive disorder, recurrent, in full remission: Secondary | ICD-10-CM | POA: Diagnosis not present

## 2023-12-24 DIAGNOSIS — F4310 Post-traumatic stress disorder, unspecified: Secondary | ICD-10-CM

## 2023-12-24 DIAGNOSIS — F411 Generalized anxiety disorder: Secondary | ICD-10-CM

## 2023-12-24 MED ORDER — PRAZOSIN HCL 1 MG PO CAPS
1.0000 mg | ORAL_CAPSULE | Freq: Every day | ORAL | 0 refills | Status: DC
  Filled 2023-12-24 – 2024-02-13 (×2): qty 90, 90d supply, fill #0

## 2023-12-24 MED ORDER — BUPROPION HCL ER (XL) 150 MG PO TB24
150.0000 mg | ORAL_TABLET | Freq: Every day | ORAL | 0 refills | Status: DC
  Filled 2023-12-24: qty 90, 90d supply, fill #0

## 2023-12-24 NOTE — Patient Instructions (Signed)
 Thank you for attending your appointment today.  -- We did not make any medication changes today. Please continue medications as prescribed. -- Here is the information for medical records:  [email or web address]  Please do not make any changes to medications without first discussing with your provider. If you are experiencing a psychiatric emergency, please call 911 or present to your nearest emergency department. Additional crisis, medication management, and therapy resources are included below.  [facility]  [address] [place], [place] [address] [phone] WALK-IN URGENT CARE 24/7 FOR ANYONE [address], [place], [place]  [phone] Fax: [phone] [email or web address] *Interpreters available *Accepts all insurance and uninsured for Urgent Care needs *Accepts Medicaid and uninsured for outpatient treatment (below)      ONLY FOR [facility]  Below:    Outpatient New Patient Assessment/Therapy Walk-ins:        Monday, Wednesday, and Thursday 8am until slots are full (first come, first served)                   New Patient Psychiatry/Medication Management        Monday-Friday 8am-11am (first come, first served)               For all walk-ins we ask that you arrive by 7:15am, because patients will be seen in the order of arrival.

## 2023-12-29 ENCOUNTER — Other Ambulatory Visit (HOSPITAL_COMMUNITY): Payer: Self-pay

## 2023-12-30 ENCOUNTER — Ambulatory Visit (INDEPENDENT_AMBULATORY_CARE_PROVIDER_SITE_OTHER): Admitting: Neurology

## 2023-12-30 DIAGNOSIS — G4733 Obstructive sleep apnea (adult) (pediatric): Secondary | ICD-10-CM | POA: Diagnosis not present

## 2023-12-30 DIAGNOSIS — G4720 Circadian rhythm sleep disorder, unspecified type: Secondary | ICD-10-CM

## 2023-12-30 DIAGNOSIS — F39 Unspecified mood [affective] disorder: Secondary | ICD-10-CM

## 2023-12-30 DIAGNOSIS — G4719 Other hypersomnia: Secondary | ICD-10-CM

## 2023-12-30 DIAGNOSIS — R0683 Snoring: Secondary | ICD-10-CM

## 2023-12-30 DIAGNOSIS — R519 Headache, unspecified: Secondary | ICD-10-CM

## 2023-12-30 DIAGNOSIS — Z9189 Other specified personal risk factors, not elsewhere classified: Secondary | ICD-10-CM

## 2023-12-30 DIAGNOSIS — E66811 Obesity, class 1: Secondary | ICD-10-CM

## 2023-12-30 DIAGNOSIS — R0681 Apnea, not elsewhere classified: Secondary | ICD-10-CM

## 2023-12-31 ENCOUNTER — Ambulatory Visit (INDEPENDENT_AMBULATORY_CARE_PROVIDER_SITE_OTHER): Admitting: Licensed Clinical Social Worker

## 2023-12-31 DIAGNOSIS — F4310 Post-traumatic stress disorder, unspecified: Secondary | ICD-10-CM

## 2023-12-31 DIAGNOSIS — F3342 Major depressive disorder, recurrent, in full remission: Secondary | ICD-10-CM

## 2023-12-31 DIAGNOSIS — F411 Generalized anxiety disorder: Secondary | ICD-10-CM

## 2023-12-31 NOTE — Progress Notes (Signed)
 THERAPIST PROGRESS NOTE  Virtual Visit via Video Note  I connected with Crystal Koch on 12/31/23 at  1:00 PM EDT by a video enabled telemedicine application and verified that I am speaking with the correct person using two identifiers.  Location: Patient: [facility]  Provider: Providers Home    I discussed the limitations of evaluation and management by telemedicine and the availability of in person appointments. The patient expressed understanding and agreed to proceed.  I discussed the assessment and treatment plan with the patient. The patient was provided an opportunity to ask questions and all were answered. The patient agreed with the plan and demonstrated an understanding of the instructions.   The patient was advised to call back or seek an in-person evaluation if the symptoms worsen or if the condition fails to improve as anticipated.  I provided 45 minutes of non-face-to-face time during this encounter.   Crystal GORMAN Patee, LCSW   Participation Level: Active  Behavioral Response: CasualAlertAnxious and Depressed  Type of Therapy: Individual Therapy  Treatment Goals addressed:  Active     Anxiety     LTG: [name] will score less than 5 on the Generalized Anxiety Disorder 7 Scale (GAD-7)  (Progressing)     Start:  10/09/23    Expected End:  04/11/24         STG: Crystal will participate in at least 80% of scheduled individual psychotherapy sessions  (Progressing)     Start:  10/09/23    Expected End:  04/11/24         STG: Crystal will complete at least 80% of assigned homework  (Progressing)     Start:  10/09/23    Expected End:  04/11/24         STG: Crystal will practice problem solving skills 3 times per week for the next 4 weeks.  (Progressing)     Start:  10/09/23    Expected End:  04/11/24         STG: Report a decrease in anxiety symptoms as evidenced by an overall reduction in anxiety score by a minimum of 25% on the Generalized Anxiety Disorder  Scale (GAD-7) (Progressing)     Start:  10/09/23    Expected End:  04/11/24         Review results of GAD-7 with Crystal to track progress     Start:  10/09/23         Discuss risks and benefits of medication treatment options for this problem and prescribe as indicated (Completed)     Start:  10/09/23    End:  11/12/23      Encourage Crystal to take psychotropic medication(s) as prescribed (Completed)     Start:  10/09/23    End:  11/12/23      Work with Crystal to track symptoms, triggers, and/or skill use through a mood chart, diary card, or journal (Completed)     Start:  10/09/23    End:  11/12/23      Perform psychoeducation regarding anxiety disorders (Completed)     Start:  10/09/23    End:  12/03/23      Provide Crystal with educational information and reading material on anxiety, its causes, and symptoms.  (Completed)     Start:  10/09/23    End:  12/03/23         ProgressTowards Goals: Progressing  Interventions: CBT, Motivational Interviewing, and Supportive\  Suicidal/Homicidal: Nowithout intent/plan  Therapist Response:      Crystal  was alert and oriented x 5.  She was pleasant, cooperative, making good eye contact.  Patient engaged well in therapy session and was dressed casually.  She presented with depressed mood\affect.  Patient reports stressors for time management.  As evidenced by self reporting not having enough time to herself because of the things that she has to do such as working 2 jobs and managing things at home.  Crystal Koch endorses symptoms for tension, worry, restlessness, and fatigue.  Crystal Koch struggles with time management and organization.  Patient reports that in his struggle balancing family, home, parents, and 2 jobs.  Intervention/plan: LCSW educated patient on the use of organization such as utilization of a Pensions consultant.  LCSW educated patient on the use of partializing to help make things more manageable.  LCSW utilized supportive therapy for  praise and encouragement.  LCSW educated patient on the use of healthy boundaries to help decrease anxiety and depression.  LCSW used cognitive behavioral therapy for reframing.  Plan: Return again in 3 weeks.  Diagnosis: PTSD (post-traumatic stress disorder)  GAD (generalized anxiety disorder)  MDD (major depressive disorder), recurrent, in full remission (HCC)  Collaboration of Care: Other None today   Patient/Guardian was advised Release of Information must be obtained prior to any record release in order to collaborate their care with an outside provider. Patient/Guardian was advised if they have not already done so to contact the registration department to sign all necessary forms in order for us  to release information regarding their care.   Consent: Patient/Guardian gives verbal consent for treatment and assignment of benefits for services provided during this visit. Patient/Guardian expressed understanding and agreed to proceed.   Crystal GORMAN Patee, LCSW 12/31/2023

## 2024-01-01 ENCOUNTER — Other Ambulatory Visit (HOSPITAL_COMMUNITY): Payer: Self-pay

## 2024-01-01 ENCOUNTER — Telehealth: Payer: Self-pay | Admitting: Family Medicine

## 2024-01-01 NOTE — Telephone Encounter (Signed)
 Pt confirmed appt 7/22

## 2024-01-02 ENCOUNTER — Other Ambulatory Visit (HOSPITAL_COMMUNITY)
Admission: RE | Admit: 2024-01-02 | Discharge: 2024-01-02 | Disposition: A | Discharge status: Home / Self Care | Source: Ambulatory Visit | Attending: Family Medicine | Admitting: Family Medicine

## 2024-01-02 ENCOUNTER — Ambulatory Visit: Attending: Family Medicine | Admitting: Family Medicine

## 2024-01-02 ENCOUNTER — Encounter: Payer: Self-pay | Admitting: Family Medicine

## 2024-01-02 ENCOUNTER — Other Ambulatory Visit: Payer: Self-pay

## 2024-01-02 VITALS — BP 134/88 | HR 89 | Ht 63.0 in | Wt 171.0 lb

## 2024-01-02 DIAGNOSIS — E785 Hyperlipidemia, unspecified: Secondary | ICD-10-CM | POA: Insufficient documentation

## 2024-01-02 DIAGNOSIS — R7303 Prediabetes: Secondary | ICD-10-CM | POA: Diagnosis not present

## 2024-01-02 DIAGNOSIS — Z113 Encounter for screening for infections with a predominantly sexual mode of transmission: Secondary | ICD-10-CM | POA: Diagnosis not present

## 2024-01-02 DIAGNOSIS — R030 Elevated blood-pressure reading, without diagnosis of hypertension: Secondary | ICD-10-CM

## 2024-01-02 NOTE — Progress Notes (Signed)
 Subjective:  Patient ID: Crystal Koch, female    DOB: [birth year]  Age: 43 y.o. MRN: [id number]  CC: Medical Management of Chronic Issues (Requesting lab work and STD testing)     Discussed the use of AI scribe software for clinical note transcription with the patient, who gave verbal consent to proceed.  History of Present Illness Crystal Koch is a 43 year old female  with a history of lumbar radiculopathy, anxiety, nicotine dependence who presents with recurrent bacterial vaginosis and elevated blood pressure.  She experiences recurrent episodes of bacterial vaginosis and is concerned about a possible yeast infection. She seeks confirmation of her current condition and guidance on managing these symptoms.  Her blood pressure had been elevated in January, with some high readings in May but outside of that has been normal.  She is not on antihypertensive medication and monitors her blood pressure regularly.  She takes atorvastatin  for elevated cholesterol and has adopted a vegan diet with raw foods to help lower cholesterol levels. She was previously found to have prediabetes and is currently fasting for lab work to reassess her status.  She has difficulty with weight loss despite dietary changes and regular physical activity, including walking her dog twice a day and being on her feet for over eight hours a day at work.    Past Medical History:  Diagnosis Date   Asthma 06/12/1984   Depression    PTSD (post-traumatic stress disorder)    Sciatica 06/12/2009   since son was born. exacerbated every winter.     Past Surgical History:  Procedure Laterality Date   CESAREAN SECTION  2011    Family History  Problem Relation Age of Onset   Thyroid disease Mother    Mental illness Mother    Cancer Father    Hypertension Maternal Uncle    Cancer Maternal Grandmother    Diabetes Neg Hx     Social History   Socioeconomic History   Marital status: Single    Spouse name: Not on  file   Number of children: 1   Years of education: college    Highest education level: Associate degree: occupational, Scientist, product/process development, or vocational program  Occupational History   Occupation: Homemaker   Tobacco Use   Smoking status: Former    Current packs/day: 0.00    Average packs/day: 0.3 packs/day for 16.0 years (4.0 ttl pk-yrs)    Types: Cigarettes    Start date: 10/03/2023    Quit date: 10/22/2023    Years since quitting: 0.1   Smokeless tobacco: Never  Vaping Use   Vaping status: Never Used  Substance and Sexual Activity   Alcohol use: No   Drug use: Yes    Types: Marijuana    Comment: prior microdosing of mushrooms twice weekly stopped as of 09/11/23. Marijuana: current use of CBD/THC   Sexual activity: Yes    Partners: Male    Birth control/protection: None  Other Topics Concern   Not on file  Social History Narrative   Lives at home with 4 yo son.    Social Drivers of Health   Financial Resource Strain: Medium Risk (12/29/2023)   Overall Financial Resource Strain (CARDIA)    Difficulty of Paying Living Expenses: Somewhat hard  Food Insecurity: Food Insecurity Present (12/29/2023)   Hunger Vital Sign    Worried About Running Out of Food in the Last Year: Often true    Ran Out of Food in the Last Year: Often true  Transportation Needs: No  Transportation Needs (12/29/2023)   PRAPARE - Administrator, Civil Service (Medical): No    Lack of Transportation (Non-Medical): No  Recent Concern: Transportation Needs - Unmet Transportation Needs (10/09/2023)   PRAPARE - Transportation    Lack of Transportation (Medical): Yes    Lack of Transportation (Non-Medical): Yes  Physical Activity: Sufficiently Active (12/29/2023)   Exercise Vital Sign    Days of Exercise per Week: 7 days    Minutes of Exercise per Session: 30 min  Stress: No Stress Concern Present (12/29/2023)   [employer] of Occupational Health - Occupational Stress Questionnaire    Feeling of Stress:  Only a little  Recent Concern: Stress - Stress Concern Present (10/09/2023)   [employer] of Occupational Health - Occupational Stress Questionnaire    Feeling of Stress : Rather much  Social Connections: Moderately Integrated (12/29/2023)   Social Connection and Isolation Panel    Frequency of Communication with Friends and Family: More than three times a week    Frequency of Social Gatherings with Friends and Family: More than three times a week    Attends Religious Services: More than 4 times per year    Active Member of [employer] or Organizations: Yes    Attends Engineer, structural: More than 4 times per year    Marital Status: Separated  Recent Concern: Social Connections - Moderately Isolated (10/09/2023)   Social Connection and Isolation Panel    Frequency of Communication with Friends and Family: More than three times a week    Frequency of Social Gatherings with Friends and Family: More than three times a week    Attends Religious Services: Never    Database administrator or Organizations: Yes    Attends Engineer, structural: More than 4 times per year    Marital Status: Separated    No Known Allergies  Outpatient Medications Prior to Visit  Medication Sig Dispense Refill   atorvastatin  (LIPITOR) 20 MG tablet Take 1 tablet (20 mg total) by mouth daily. 90 tablet 1   buPROPion  (WELLBUTRIN  XL) 150 MG 24 hr tablet Take 1 tablet (150 mg total) by mouth daily for smoking cessation 90 tablet 0   chlorhexidine  (PERIDEX ) 0.12 % solution Use as directed 15 mLs in the mouth or throat 2 (two) times daily as needed. Swish and spit twice daily as needed 120 mL 0   Cholecalciferol  (VITAMIN D3) 10 MCG (400 UNIT) tablet Take 1 tablet (400 Units total) by mouth daily. 100 tablet 1   cyclobenzaprine  (FLEXERIL ) 5 MG tablet Take 1 tablet (5 mg total) by mouth 3 (three) times daily as needed for muscle spasms. 30 tablet 2   fluticasone  (FLONASE ) 50 MCG/ACT nasal spray Place 2  sprays into both nostrils daily. 16 g 6   gabapentin  (NEURONTIN ) 100 MG capsule Take 1 capsule (100 mg total) by mouth 2 (two) times daily. 60 capsule 3   omeprazole  (PRILOSEC) 40 MG capsule Take 1 capsule (40 mg total) by mouth daily. 90 capsule 1   prazosin  (MINIPRESS ) 1 MG capsule Take 1 capsule (1 mg total) by mouth at bedtime. 90 capsule 0   clindamycin  (CLEOCIN ) 300 MG capsule Take 1 capsule (300 mg total) by mouth 3 (three) times daily. (Patient not taking: Reported on 01/02/2024) 21 capsule 0   fluconazole  (DIFLUCAN ) 150 MG tablet Take 1 tablet PO once. Repeat in 3 days if needed. (Patient not taking: Reported on 01/02/2024) 2 tablet 0   naproxen  (NAPROSYN ) 500  MG tablet Take 1 tablet (500 mg total) by mouth 2 (two) times daily with a meal. (Patient not taking: Reported on 01/02/2024) 20 tablet 0   No facility-administered medications prior to visit.     ROS Review of Systems  Constitutional:  Negative for activity change and appetite change.  HENT:  Negative for sinus pressure and sore throat.   Respiratory:  Negative for chest tightness, shortness of breath and wheezing.   Cardiovascular:  Negative for chest pain and palpitations.  Gastrointestinal:  Negative for abdominal distention, abdominal pain and constipation.  Genitourinary: Negative.   Musculoskeletal: Negative.   Psychiatric/Behavioral:  Negative for behavioral problems and dysphoric mood.     Objective:  BP 134/88   Pulse 89   Ht 5' 3 (1.6 m)   Wt 171 lb (77.6 kg)   SpO2 99%   BMI 30.29 kg/m      01/02/2024    9:03 AM 10/29/2023    2:36 PM 10/04/2023    8:54 AM  BP/Weight  Systolic BP 134 144 117  Diastolic BP 88 95 79  Wt. (Lbs) 171  168  BMI 30.29 kg/m2  29.76 kg/m2      Physical Exam Constitutional:      Appearance: She is well-developed.  Cardiovascular:     Rate and Rhythm: Normal rate.     Heart sounds: Normal heart sounds. No murmur heard. Pulmonary:     Effort: Pulmonary effort is normal.      Breath sounds: Normal breath sounds. No wheezing or rales.  Chest:     Chest wall: No tenderness.  Abdominal:     General: Bowel sounds are normal. There is no distension.     Palpations: Abdomen is soft. There is no mass.     Tenderness: There is no abdominal tenderness.  Musculoskeletal:        General: Normal range of motion.     Right lower leg: No edema.     Left lower leg: No edema.  Neurological:     Mental Status: She is alert and oriented to person, place, and time.  Psychiatric:        Mood and Affect: Mood normal.        Latest Ref Rng & Units 06/25/2023   12:10 PM 12/21/2022   11:29 AM 03/02/2022   10:15 AM  CMP  Glucose 70 - 99 mg/dL 78  75  75   BUN 6 - 24 mg/dL 11  11  14    Creatinine 0.57 - 1.00 mg/dL 9.33  9.32  9.25   Sodium 134 - 144 mmol/L 138  138  141   Potassium 3.5 - 5.2 mmol/L 4.4  4.2  4.0   Chloride 96 - 106 mmol/L 100  104  104   CO2 20 - 29 mmol/L 25  22  23    Calcium  8.7 - 10.2 mg/dL 9.4  9.2  9.0   Total Protein 6.0 - 8.5 g/dL 7.2  6.5    Total Bilirubin 0.0 - 1.2 mg/dL 0.2  0.3    Alkaline Phos 44 - 121 IU/L 79  65    AST 0 - 40 IU/L 19  11    ALT 0 - 32 IU/L 22  11      Lipid Panel     Component Value Date/Time   CHOL 228 (H) 06/25/2023 1210   TRIG 70 06/25/2023 1210   HDL 55 06/25/2023 1210   CHOLHDL 5.0 (H) 03/24/2021 1059   LDLCALC 161 (H)  06/25/2023 1210    CBC    Component Value Date/Time   WBC 7.8 06/25/2023 1210   WBC 6.7 06/19/2014 0954   RBC 5.37 (H) 06/25/2023 1210   RBC 4.55 06/19/2014 0954   HGB 15.3 06/25/2023 1210   HCT 47.2 (H) 06/25/2023 1210   PLT 364 06/25/2023 1210   MCV 88 06/25/2023 1210   MCH 28.5 06/25/2023 1210   MCH 28.8 06/19/2014 0954   MCHC 32.4 06/25/2023 1210   MCHC 33.8 06/19/2014 0954   RDW 12.6 06/25/2023 1210   LYMPHSABS 3.0 06/25/2023 1210   EOSABS 0.1 06/25/2023 1210   BASOSABS 0.0 06/25/2023 1210    Lab Results  Component Value Date   HGBA1C 5.8 (H) 03/02/2022          Assessment & Plan Screening for STD Reports recurrent BV, concerned about yeast or other infections. - Perform vaginal swab for BV and yeast infection.  Transient elevated blood pressure Previous elevated BP, current readings excellent. Advised home monitoring to maintain <140/80 mmHg. - Recommend obtaining home BP cuff. - Advise reporting upward BP trends.  Hyperlipidemia On atorvastatin , implementing dietary changes including vegan diet. - Continue atorvastatin . - Evaluate lifestyle changes and atorvastatin  effect on cholesterol.  Prediabetes Borderline prediabetes, fasting for lab reassessment. - Order lab work to reassess prediabetes status.  General Health Maintenance Engaged in lifestyle modifications, vegan diet, regular physical activity. - Encourage low cholesterol vegan diet. - Advise on regular physical activity and dietary monitoring.  Follow-up Scheduled for follow-up in six months to review labs and health status. - Schedule follow-up in six months.   No orders of the defined types were placed in this encounter.   Follow-up: Return in about 6 months (around 07/04/2024) for Chronic medical conditions.       Corrina Sabin, MD, FAAFP. [facility] [place], [place] [phone]   01/02/2024, 9:20 AM

## 2024-01-02 NOTE — Patient Instructions (Signed)
 VISIT SUMMARY:  Today, you were seen for recurrent bacterial vaginosis and elevated blood pressure. We discussed your concerns about a possible yeast infection and reviewed your current health status, including your blood pressure, cholesterol levels, and prediabetes.  YOUR PLAN:  -RECURRENT BACTERIAL VAGINOSIS: Bacterial vaginosis is an infection caused by an imbalance of bacteria in the vagina. We will perform a vaginal swab to check for bacterial vaginosis and a yeast infection.  -HYPERTENSION: Hypertension, or high blood pressure, can increase the risk of heart disease and stroke. Your current blood pressure readings are excellent. We recommend you obtain a home blood pressure cuff to monitor your blood pressure and report any upward trends.  -HYPERLIPIDEMIA: Hyperlipidemia means having high levels of cholesterol in the blood, which can increase the risk of heart disease. Continue taking atorvastatin  and follow your vegan diet. We will evaluate the effect of these lifestyle changes and medication on your cholesterol levels.  -PREDIABETES: Prediabetes is a condition where blood sugar levels are higher than normal but not high enough to be classified as diabetes. We have ordered lab work to reassess your prediabetes status.  -GENERAL HEALTH MAINTENANCE: You are engaged in positive lifestyle modifications, including a vegan diet and regular physical activity. Continue with your low cholesterol vegan diet and regular physical activity, and monitor your diet closely.  INSTRUCTIONS:  Please schedule a follow-up appointment in six months to review your lab results and overall health status.

## 2024-01-03 ENCOUNTER — Ambulatory Visit: Payer: Self-pay | Admitting: Family Medicine

## 2024-01-03 ENCOUNTER — Ambulatory Visit: Payer: Self-pay | Admitting: Neurology

## 2024-01-03 DIAGNOSIS — G4733 Obstructive sleep apnea (adult) (pediatric): Secondary | ICD-10-CM

## 2024-01-03 LAB — LP+NON-HDL CHOLESTEROL
Cholesterol, Total: 211 mg/dL — ABNORMAL HIGH (ref 100–199)
HDL: 47 mg/dL (ref >39)
LDL Chol Calc (NIH): 155 mg/dL — ABNORMAL HIGH (ref 0–99)
Total Non-HDL-Chol (LDL+VLDL): 164 mg/dL — ABNORMAL HIGH (ref 0–129)
Triglycerides: 51 mg/dL (ref 0–149)
VLDL Cholesterol Cal: 9 mg/dL (ref 5–40)

## 2024-01-03 LAB — CERVICOVAGINAL ANCILLARY ONLY
Bacterial Vaginitis (gardnerella): NEGATIVE
Candida Glabrata: NEGATIVE
Candida Vaginitis: NEGATIVE
Chlamydia: NEGATIVE
Comment: NEGATIVE
Comment: NEGATIVE
Comment: NEGATIVE
Comment: NEGATIVE
Comment: NEGATIVE
Comment: NORMAL
Neisseria Gonorrhea: NEGATIVE
Trichomonas: NEGATIVE

## 2024-01-03 LAB — HEMOGLOBIN A1C
Est. average glucose Bld gHb Est-mCnc: 120 mg/dL
Hgb A1c MFr Bld: 5.8 % — ABNORMAL HIGH (ref 4.8–5.6)

## 2024-01-03 MED ORDER — ATORVASTATIN CALCIUM 40 MG PO TABS: 40.0000 mg | ORAL_TABLET | Freq: Every day | ORAL | 1 refills | Status: AC

## 2024-01-03 NOTE — Procedures (Signed)
 Physician Interpretation:     [facility] POLYSOMNOGRAPHY  INTERPRETATION REPORT   STUDY DATE:  12/30/2023     PATIENT NAME:  Crystal Koch         DATE OF BIRTH:  01-27-1981  PATIENT ID:  [id number]    TYPE OF STUDY:  PSG  READING PHYSICIAN: TRUE MAR, MD, PhD   SCORING TECHNICIAN: Jesusa Haddock, RPSGT     Referred by: Dr. Lauraine Pummel ? History and Indication for Testing: 43 year-old female with an underlying medical history of asthma, sciatica, depression, anxiety, PTSD, and overweight state, who reports snoring and excessive daytime somnolence as well as witnessed apneas, per S.O. Her Epworth sleepiness score is 18 out of 24, fatigue severity score is 52 out of 63.  Height: 62 in Weight: 170 lb (BMI 31) Neck Size: 14 in    MEDICATIONS: Lipitor, Wellbutrin  XL, Peridex , Flexeril , Prozac , Flonase , Neurontin , Mobic , Prilosec, Prazosin    TECHNICAL DESCRIPTION: The patient took Flexeril  and gabapentin  prior to lights out. A registered sleep technologist was in attendance for the duration of the recording.  Data collection, scoring, video monitoring, and reporting were performed in compliance with the AASM Manual for the Scoring of Sleep and Associated Events; (Hypopnea is scored based on the criteria listed in Section VIII D. 1b in the AASM Manual V2.6 using a 4% oxygen desaturation rule or Hypopnea is scored based on the criteria listed in Section VIII D. 1a in the AASM Manual V2.6 using 3% oxygen desaturation and /or arousal rule).  SLEEP CONTINUITY AND SLEEP ARCHITECTURE:  Lights-out was at 22:05: and lights-on at  05:11:, with a total recording time of 7 hours, 6 min. Total sleep time ( TST) was 382.0 minutes with a normal sleep efficiency at 89.7%. There was  11.9% REM sleep.    BODY POSITION:  TST was divided  between the following sleep positions: 65.3% supine;  32.1% lateral;  3% prone. Duration of total sleep and percent of total sleep in their  respective position is as follows: supine 249 minutes (65%), non-supine 133 minutes (35%); right 23 minutes (6%), left 99 minutes (26%), and prone 10 minutes (3%).  Total supine REM sleep time was 30 minutes (66% of total REM sleep).  Sleep latency was normal at 27.0 minutes.  REM sleep latency was normal at 91.0 minutes. Of the total sleep time, the percentage of stage N1 sleep was 1.4%, stage N2 sleep was 64%, which is increased, stage N3 sleep was 22.6%, which is mildly elevated, and REM sleep was 11.9%, which is reduced. Wake after sleep onset (WASO) time accounted for 17 minutes with minimal to mild sleep fragmentation noted.   RESPIRATORY MONITORING:   Based on CMS criteria (using a 4% oxygen desaturation rule for scoring hypopneas), there were 3 apneas (3 obstructive; 0 central; 0 mixed), and 85 hypopneas.  Apnea index was 0.5. Hypopnea index was 13.4. The apnea-hypopnea index was 13.8/hour overall (14.4 supine, 31 non-supine; 29.0 REM, 28.0 supine REM).  There were 0 respiratory effort-related arousals (RERAs).  The RERA index was 0 events/h. Total respiratory disturbance index (RDI) was 13.8 events/h. RDI results showed: supine RDI  14.4 /h; non-supine RDI 12.7 /h; REM RDI 29.0 /h, supine REM RDI 28.0 /h.   Based on AASM criteria (using a 3% oxygen desaturation and /or arousal rule for scoring hypopneas), there were 3 apneas (3 obstructive; 0 central; 0 mixed), and 148 hypopneas. Apnea index was 0.5. Hypopnea index was 23.2. The apnea-hypopnea index  was 23.7 overall (24.8 supine, 43 non-supine; 40.9 REM, 40.0 supine REM).  There were 0 respiratory effort-related arousals (RERAs).  The RERA index was 0 events/h. Total respiratory disturbance index (RDI) was 23.7 events/h. RDI results showed: supine RDI  24.8 /h; non-supine RDI 21.7 /h; REM RDI 40.9 /h, supine REM RDI 40.0 /h.    OXIMETRY: Oxyhemoglobin Saturation Nadir during sleep was at  88% from a mean of 97%.  Of the Total sleep time (TST)    hypoxemia (=<88%) was present for  0.6 minutes, or 0.2% of total sleep time.    LIMB MOVEMENTS: There were 0 periodic limb movements of sleep (0.0/hr), of which 0 (0.0/hr) were associated with an arousal.   AROUSAL: There were 112 arousals in total, for an arousal index of 18 arousals/hour.  Of these, 58 were identified as respiratory-related arousals (9 /h), 0 were PLM-related arousals (0 /h), and 66 were non-specific arousals (10 /h).      EEG: Review of the EEG showed no abnormal electrical discharges and symmetrical bihemispheric findings.      EKG: The EKG revealed normal sinus rhythm (NSR). The average heart rate during sleep was 76 bpm.     AUDIO/VIDEO REVIEW: The audio and video review did not show any abnormal or unusual behaviors, movements, phonations or vocalizations. The patient took no restroom breaks. Snoring was noted, in the mild to moderate range.     POST-STUDY QUESTIONNAIRE: Post study, the patient indicated, that sleep was better than usual.     IMPRESSION:    1. Mild Obstructive Sleep Apnea (OSA) 2. Dysfunctions associated with sleep stages or arousal from sleep  RECOMMENDATIONS:    1. This study demonstrates overall mild obstructive sleep apnea, more pronounced during REM sleep and in the supine position, with a total AHI of 13.8/hour, and O2 nadir of 88%. Given the patient's medical history and sleep related complaints, treatment with positive airway pressure is recommended; this can be achieved in the form of autoPAP. A full-night CPAP titration study would allow optimization of therapy if needed, down the road. Other treatment options may include avoidance of supine sleep position along with weight loss (where clinically feasible and applicable), or the use of an oral appliance in selected patients.  Generally speaking, surgical treatment options are not recommended for mild obstructive sleep apnea. Please note, that untreated obstructive sleep apnea may carry  additional perioperative morbidity. Patients with significant obstructive sleep apnea should receive perioperative PAP therapy and the surgeons and particularly the anesthesiologist should be informed of the diagnosis and the severity of the sleep disordered breathing. 2. This study shows some sleep fragmentation and abnormal sleep stage percentages; these are nonspecific findings and per se do not signify an intrinsic sleep disorder or a cause for the patient's sleep-related symptoms. Causes include (but are not limited to) the first night effect of the sleep study, circadian rhythm disturbances, medication effect or an underlying mood disorder or medical problem.  3. The patient should be cautioned not to drive, work at heights, or operate dangerous or heavy equipment when tired or sleepy. Review and reiteration of good sleep hygiene measures should be pursued with any patient. 4. The patient will be seen in follow-up in the Sleep Clinic at [facility] for discussion of the test results, progress with treatment, and recheck on symptoms as well as potential further management strategies. The referring provider and the patient will be notified of the test results. I certify that I have reviewed the entire raw data  recording prior to the issuance of this report in accordance with the Standards of Accreditation of the American Academy of Sleep Medicine (AASM).  True Mar, MD, PhD Medical Director, [facility]) Diplomat, ABPN (Neurology and Sleep)              Technical Report:   General Information  Name: Jood, Retana BMI: 31.09 Physician: ,   ID: [id number] Height: 62.0 in Technician: Harvey Brisker  Sex: Female Weight: 170.0 lb Record: [id number]  Age: 47 [1981/01/30] Date: 12/30/2023 Scorer: Brisker Harvey  Medical & Medication History    43 year-old female with an underlying medical history of asthma, sciatica, depression, anxiety, PTSD, and  overweight state, who reports snoring and excessive daytime somnolence as well as witnessed apneas, per S.O. Her snoring has become louder in the past year, sleep disturbance and sleepiness during the day have been ongoing for at least 1 or 2 years.  Lipitor, Wellbutrin  XL, Peridex , Flexeril , Prozac , Flonase , Neurontin , Mobic , Prilosec, Prazosin    Sleep Disorder      Comments   The patient came into the sleep lab for a PSG. The patient took Gabapentin  and Flexeril  prior to start of study. No restroom breaks. EKG did not show any obvious cardiac arrhythmias. Mild to moderate snoring. All sleep stages witnessed. Respiratory events scored with a 4% desat. The patient slept supine, prone and lateral. AHI was 13.2 after 2hrs of TST.     Lights out: 10:05:55 PM Lights on: 05:11:33 AM   Time Total Supine Side Prone Upright  Recording (TRT) 7h 6.67m 4h 29.29m 2h 27.52m 0h 10.74m 0h 0.65m  Sleep (TST) 6h 22.10m 4h 9.14m 2h 2.75m 0h 10.35m 0h 0.59m   Latency N1 N2 N3 REM Onset Per. Slp. Eff.  Actual 0h 27.66m 0h 30.87m 0h 40.44m 1h 31.61m 0h 27.57m 0h 29.61m 89.67%   Stg Dur Wake N1 N2 N3 REM  Total 17.0 5.5 244.5 86.5 45.5  Supine 12.0 4.0 167.5 48.0 30.0  Side 5.0 1.5 77.0 28.5 15.5  Prone 0.0 0.0 0.0 10.0 0.0  Upright 0.0 0.0 0.0 0.0 0.0   Stg % Wake N1 N2 N3 REM  Total 4.3 1.4 64.0 22.6 11.9  Supine 3.0 1.0 43.8 12.6 7.9  Side 1.3 0.4 20.2 7.5 4.1  Prone 0.0 0.0 0.0 2.6 0.0  Upright 0.0 0.0 0.0 0.0 0.0     Apnea Summary Sub Supine Side Prone Upright  Total 3 Total 3 2 1  0 0    REM 0 0 0 0 0    NREM 3 2 1  0 0  Obs 3 REM 0 0 0 0 0    NREM 3 2 1  0 0  Mix 0 REM 0 0 0 0 0    NREM 0 0 0 0 0  Cen 0 REM 0 0 0 0 0    NREM 0 0 0 0 0   Rera Summary Sub Supine Side Prone Upright  Total 0 Total 0 0 0 0 0    REM 0 0 0 0 0    NREM 0 0 0 0 0   Hypopnea Summary Sub Supine Side Prone Upright  Total 148 Total 148 101 46 1 0    REM 31 20 11  0 0    NREM 117 81 35 1 0   4% Hypopnea Summary Sub Supine Side  Prone Upright  Total (4%) 85 Total 85 58 27 0 0    REM 22 14 8  0  0    NREM 63 44 19 0 0     AHI Total Obs Mix Cen  23.72 Apnea 0.47 0.47 0.00 0.00   Hypopnea 23.25 -- -- --  13.82 Hypopnea (4%) 13.35 -- -- --    Total Supine Side Prone Upright  Position AHI 23.72 24.77 23.02 6.00 0.00  REM AHI 40.88   NREM AHI 21.40   Position RDI 23.72 24.77 23.02 6.00 0.00  REM RDI 40.88   NREM RDI 21.40    4% Hypopnea Total Supine Side Prone Upright  Position AHI (4%) 13.82 14.43 13.71 0.00 0.00  REM AHI (4%) 29.01   NREM AHI (4%) 11.77   Position RDI (4%) 13.82 14.43 13.71 0.00 0.00  REM RDI (4%) 29.01   NREM RDI (4%) 11.77    Desaturation Information Threshold: 2% <100% <90% <80% <70% <60% <50% <40%  Supine 151.0 0.0 0.0 0.0 0.0 0.0 0.0  Side 74.0 4.0 0.0 0.0 0.0 0.0 0.0  Prone 6.0 0.0 0.0 0.0 0.0 0.0 0.0  Upright 0.0 0.0 0.0 0.0 0.0 0.0 0.0  Total 231.0 4.0 0.0 0.0 0.0 0.0 0.0  Index 34.7 0.6 0.0 0.0 0.0 0.0 0.0   Threshold: 3% <100% <90% <80% <70% <60% <50% <40%  Supine 103.0 0.0 0.0 0.0 0.0 0.0 0.0  Side 50.0 4.0 0.0 0.0 0.0 0.0 0.0  Prone 1.0 0.0 0.0 0.0 0.0 0.0 0.0  Upright 0.0 0.0 0.0 0.0 0.0 0.0 0.0  Total 154.0 4.0 0.0 0.0 0.0 0.0 0.0  Index 23.2 0.6 0.0 0.0 0.0 0.0 0.0   Threshold: 4% <100% <90% <80% <70% <60% <50% <40%  Supine 56.0 0.0 0.0 0.0 0.0 0.0 0.0  Side 30.0 4.0 0.0 0.0 0.0 0.0 0.0  Prone 0.0 0.0 0.0 0.0 0.0 0.0 0.0  Upright 0.0 0.0 0.0 0.0 0.0 0.0 0.0  Total 86.0 4.0 0.0 0.0 0.0 0.0 0.0  Index 12.9 0.6 0.0 0.0 0.0 0.0 0.0   Threshold: 4% <100% <90% <80% <70% <60% <50% <40%  Supine 56 0 0 0 0 0 0  Side 30 4 0 0 0 0 0  Prone 0 0 0 0 0 0 0  Upright 0 0 0 0 0 0 0  Total 86 4 0 0 0 0 0   Awakening/Arousal Information # of Awakenings 20  Wake after sleep onset 17.92m  Wake after persistent sleep 16.39m   Arousal Assoc. Arousals Index  Apneas 0 0.0  Hypopneas 58 9.1  Leg Movements 0 0.0  Snore 0 0.0  PTT Arousals 0 0.0  Spontaneous 66 10.4  Total  124 19.5  Leg Movement Information PLMS LMs Index  Total LMs during PLMS 0 0.0  LMs w/ Microarousals 0 0.0   LM LMs Index  w/ Microarousal 0 0.0  w/ Awakening 0 0.0  w/ Resp Event 0 0.0  Spontaneous 0 0.0  Total 0 0.0     Desaturation threshold setting: 4% Minimum desaturation setting: 10 seconds SaO2 nadir: 86% The longest event was a 71 sec obstructive Hypopnea with a minimum SaO2 of 95%. The lowest SaO2 was 88% associated with a 13 sec obstructive Hypopnea. EKG Rates EKG Avg Max Min  Awake 80 108 63  Asleep 76 96 62  EKG Events: Tachycardia

## 2024-01-03 NOTE — Telephone Encounter (Signed)
 Called pt and discussed sleep study results as noted below by Dr Buck. The patient verbalized understanding and agreement to proceed with autopap. We discussed the insurance compliance requirements which includes using the machine at least 4 hours at night and also being seen by our office between 30 and 90 days after setup. The pt was scheduled for Mon 10/20 at 9 am arrive 845. We discussed for her to watch for a call from [facility] within 1 week but call us  if she doesn't hear. She thanked me for the call.  Referral sent to [facility]. Report also sent to referring provider.

## 2024-01-04 ENCOUNTER — Other Ambulatory Visit: Payer: Self-pay | Admitting: Family Medicine

## 2024-01-04 ENCOUNTER — Encounter: Payer: Self-pay | Admitting: Family Medicine

## 2024-01-04 DIAGNOSIS — G4733 Obstructive sleep apnea (adult) (pediatric): Secondary | ICD-10-CM | POA: Insufficient documentation

## 2024-01-04 MED ORDER — MISC. DEVICES MISC
0 refills | Status: DC
Start: 2024-01-04 — End: 2024-01-16

## 2024-01-14 DIAGNOSIS — M9913 Subluxation complex (vertebral) of lumbar region: Secondary | ICD-10-CM | POA: Diagnosis not present

## 2024-01-14 DIAGNOSIS — M9915 Subluxation complex (vertebral) of pelvic region: Secondary | ICD-10-CM | POA: Diagnosis not present

## 2024-01-14 DIAGNOSIS — M9914 Subluxation complex (vertebral) of sacral region: Secondary | ICD-10-CM | POA: Diagnosis not present

## 2024-01-16 ENCOUNTER — Other Ambulatory Visit: Payer: Self-pay

## 2024-01-16 MED ORDER — MISC. DEVICES MISC: 0 refills | Status: AC

## 2024-01-21 ENCOUNTER — Ambulatory Visit (HOSPITAL_COMMUNITY): Admitting: Licensed Clinical Social Worker

## 2024-01-21 ENCOUNTER — Encounter (HOSPITAL_COMMUNITY): Payer: Self-pay

## 2024-01-28 ENCOUNTER — Ambulatory Visit: Admitting: Family Medicine

## 2024-01-28 ENCOUNTER — Encounter: Payer: Self-pay | Admitting: Family Medicine

## 2024-01-28 ENCOUNTER — Telehealth (HOSPITAL_BASED_OUTPATIENT_CLINIC_OR_DEPARTMENT_OTHER): Admitting: Family Medicine

## 2024-01-28 DIAGNOSIS — G4733 Obstructive sleep apnea (adult) (pediatric): Secondary | ICD-10-CM | POA: Diagnosis not present

## 2024-01-28 NOTE — Progress Notes (Signed)
 Virtual Visit via Video Note  I connected with Crystal Koch, on 01/28/2024 at 8:14 AM by video enabled telemedicine device and verified that I am speaking with the correct person using two identifiers.   Consent: I discussed the limitations, risks, security and privacy concerns of performing an evaluation and management service by telemedicine and the availability of in person appointments. I also discussed with the patient that there may be a patient responsible charge related to this service. The patient expressed understanding and agreed to proceed.   Location of Patient: Home  Location of Provider: Clinic   Persons participating in Telemedicine visit: Crystal Koch Dr. Delbert    Discussed the use of AI scribe software for clinical note transcription with the patient, who gave verbal consent to proceed.  History of Present Illness Crystal Koch is a 43 year old female with a history of lumbar radiculopathy, GAD, MDD, nicotine dependence who presents to discuss her sleep study results and CPAP machine necessity.  She experiences symptoms consistent with sleep apnea, including waking up with a choking sensation after snoring and persistent fatigue regardless of sleep duration. These symptoms have been present since the beginning of the year and significantly impact her rest, as she spends her days off resting due to fatigue.   A sleep study was conducted, revealing an apnea-hypopnea index of 13.8 events per hour.      Past Medical History:  Diagnosis Date   Asthma 06/12/1984   Depression    PTSD (post-traumatic stress disorder)    Sciatica 06/12/2009   since son was born. exacerbated every winter.    No Known Allergies  Current Outpatient Medications on File Prior to Visit  Medication Sig Dispense Refill   atorvastatin  (LIPITOR) 40 MG tablet Take 1 tablet (40 mg total) by mouth daily. 90 tablet 1   buPROPion  (WELLBUTRIN  XL) 150 MG 24 hr tablet Take 1 tablet (150 mg  total) by mouth daily for smoking cessation 90 tablet 0   chlorhexidine  (PERIDEX ) 0.12 % solution Use as directed 15 mLs in the mouth or throat 2 (two) times daily as needed. Swish and spit twice daily as needed 120 mL 0   Cholecalciferol  (VITAMIN D3) 10 MCG (400 UNIT) tablet Take 1 tablet (400 Units total) by mouth daily. 100 tablet 1   clindamycin  (CLEOCIN ) 300 MG capsule Take 1 capsule (300 mg total) by mouth 3 (three) times daily. (Patient not taking: Reported on 01/02/2024) 21 capsule 0   cyclobenzaprine  (FLEXERIL ) 5 MG tablet Take 1 tablet (5 mg total) by mouth 3 (three) times daily as needed for muscle spasms. 30 tablet 2   fluconazole  (DIFLUCAN ) 150 MG tablet Take 1 tablet PO once. Repeat in 3 days if needed. (Patient not taking: Reported on 01/02/2024) 2 tablet 0   fluticasone  (FLONASE ) 50 MCG/ACT nasal spray Place 2 sprays into both nostrils daily. 16 g 6   gabapentin  (NEURONTIN ) 100 MG capsule Take 1 capsule (100 mg total) by mouth 2 (two) times daily. 60 capsule 3   Misc. Devices MISC Auto PAP 5 to 15 cm of water.  Diagnosis-obstructive sleep apnea 1 each 0   naproxen  (NAPROSYN ) 500 MG tablet Take 1 tablet (500 mg total) by mouth 2 (two) times daily with a meal. (Patient not taking: Reported on 01/02/2024) 20 tablet 0   omeprazole  (PRILOSEC) 40 MG capsule Take 1 capsule (40 mg total) by mouth daily. 90 capsule 1   prazosin  (MINIPRESS ) 1 MG capsule Take 1 capsule (1 mg total) by  mouth at bedtime. 90 capsule 0   No current facility-administered medications on file prior to visit.    ROS: See HPI  Observations/Objective: Awake, alert, oriented x3 Not in acute distress Normal mood      Latest Ref Rng & Units 06/25/2023   12:10 PM 12/21/2022   11:29 AM 03/02/2022   10:15 AM  CMP  Glucose 70 - 99 mg/dL 78  75  75   BUN 6 - 24 mg/dL 11  11  14    Creatinine 0.57 - 1.00 mg/dL 9.33  9.32  9.25   Sodium 134 - 144 mmol/L 138  138  141   Potassium 3.5 - 5.2 mmol/L 4.4  4.2  4.0    Chloride 96 - 106 mmol/L 100  104  104   CO2 20 - 29 mmol/L 25  22  23    Calcium  8.7 - 10.2 mg/dL 9.4  9.2  9.0   Total Protein 6.0 - 8.5 g/dL 7.2  6.5    Total Bilirubin 0.0 - 1.2 mg/dL 0.2  0.3    Alkaline Phos 44 - 121 IU/L 79  65    AST 0 - 40 IU/L 19  11    ALT 0 - 32 IU/L 22  11      Lipid Panel     Component Value Date/Time   CHOL 211 (H) 01/02/2024 0930   TRIG 51 01/02/2024 0930   HDL 47 01/02/2024 0930   CHOLHDL 5.0 (H) 03/24/2021 1059   LDLCALC 155 (H) 01/02/2024 0930   LABVLDL 9 01/02/2024 0930    Lab Results  Component Value Date   HGBA1C 5.8 (H) 01/02/2024     Assessment and plan:  There are no diagnoses linked to this encounter.   No orders of the defined types were placed in this encounter.   Follow Up Instructions: Keep previously scheduled appointment    I discussed the assessment and treatment plan with the patient. The patient was provided an opportunity to ask questions and all were answered. The patient agreed with the plan and demonstrated an understanding of the instructions.   The patient was advised to call back or seek an in-person evaluation if the symptoms worsen or if the condition fails to improve as anticipated.     I provided 12 minutes total of Telehealth time during this encounter including median intraservice time, reviewing previous notes, investigations, ordering medications, medical decision making, coordinating care and patient verbalized understanding at the end of the visit.     Corrina Sabin, MD, FAAFP. [facility] [place], [place] [phone]   01/28/2024, 8:14 AM

## 2024-01-28 NOTE — Patient Instructions (Signed)

## 2024-01-29 ENCOUNTER — Ambulatory Visit (HOSPITAL_COMMUNITY): Admitting: Licensed Clinical Social Worker

## 2024-01-31 DIAGNOSIS — M9913 Subluxation complex (vertebral) of lumbar region: Secondary | ICD-10-CM | POA: Diagnosis not present

## 2024-01-31 DIAGNOSIS — M9914 Subluxation complex (vertebral) of sacral region: Secondary | ICD-10-CM | POA: Diagnosis not present

## 2024-01-31 DIAGNOSIS — M9915 Subluxation complex (vertebral) of pelvic region: Secondary | ICD-10-CM | POA: Diagnosis not present

## 2024-02-07 ENCOUNTER — Ambulatory Visit (HOSPITAL_COMMUNITY): Admitting: Licensed Clinical Social Worker

## 2024-02-13 ENCOUNTER — Other Ambulatory Visit: Payer: Self-pay

## 2024-02-13 ENCOUNTER — Other Ambulatory Visit: Payer: Self-pay | Admitting: Physical Medicine and Rehabilitation

## 2024-02-13 ENCOUNTER — Other Ambulatory Visit (HOSPITAL_COMMUNITY): Payer: Self-pay

## 2024-02-13 DIAGNOSIS — M5416 Radiculopathy, lumbar region: Secondary | ICD-10-CM

## 2024-02-15 ENCOUNTER — Telehealth: Payer: Self-pay

## 2024-02-15 DIAGNOSIS — F411 Generalized anxiety disorder: Secondary | ICD-10-CM

## 2024-02-15 NOTE — Telephone Encounter (Signed)
 Pre procedurals medication requested

## 2024-02-15 NOTE — Progress Notes (Signed)
 This encounter was created in error - please disregard.

## 2024-02-18 ENCOUNTER — Encounter (HOSPITAL_COMMUNITY): Payer: Self-pay

## 2024-02-18 ENCOUNTER — Encounter (HOSPITAL_COMMUNITY): Admitting: Psychiatry

## 2024-02-18 ENCOUNTER — Other Ambulatory Visit (HOSPITAL_COMMUNITY): Payer: Self-pay | Admitting: Psychiatry

## 2024-02-18 MED ORDER — DIAZEPAM 5 MG PO TABS: ORAL_TABLET | ORAL | 0 refills | Status: DC

## 2024-02-18 MED ORDER — HYDROXYZINE HCL 25 MG PO TABS: 25.0000 mg | ORAL_TABLET | Freq: Two times a day (BID) | ORAL | 1 refills | Status: AC | PRN

## 2024-02-18 MED ORDER — BUPROPION HCL ER (XL) 150 MG PO TB24: 150.0000 mg | ORAL_TABLET | Freq: Every day | ORAL | 0 refills | Status: DC

## 2024-02-18 MED ORDER — PRAZOSIN HCL 1 MG PO CAPS: 1.0000 mg | ORAL_CAPSULE | Freq: Every day | ORAL | 0 refills | Status: DC

## 2024-02-18 NOTE — Addendum Note (Signed)
 Addended by: ELDONNA GARDINER POUR on: 02/18/2024 08:27 AM   Modules accepted: Orders

## 2024-02-18 NOTE — Progress Notes (Signed)
 Patient joined for virtual psychiatry medication management appointment on 02/18/24 however reported need to reschedule due to conflicting vet appointment. Rescheduled to next available on 04/21/24 at 10AM by video. Reports adherence to current psychotropic regimen; will send in bridge of medications until that time. She requests PRN anxiolytic for moments of acute stress - endorses past trial of hydroxyzine  although cannot remember efficacy. Amenable to retrial at this time with Atarax  25-50 mg BID PRN anxiety.   Crystal DELENA PUMMEL, MD 02/18/24

## 2024-03-03 ENCOUNTER — Ambulatory Visit: Admitting: Physical Medicine and Rehabilitation

## 2024-03-03 ENCOUNTER — Other Ambulatory Visit: Payer: Self-pay

## 2024-03-03 VITALS — BP 147/92 | HR 83

## 2024-03-03 DIAGNOSIS — M5416 Radiculopathy, lumbar region: Secondary | ICD-10-CM

## 2024-03-03 MED ORDER — METHYLPREDNISOLONE ACETATE 80 MG/ML IJ SUSP
40.0000 mg | Freq: Once | INTRAMUSCULAR | Status: AC
  Administered 2024-03-03: 40 mg

## 2024-03-03 NOTE — Progress Notes (Signed)
 Crystal Koch - 43 y.o. female MRN [id number]  Date of birth: 04/28/1981  Office Visit Note: Visit Date: 03/03/2024 PCP: Delbert Clam, MD Referred by: Delbert Clam, MD  Subjective: Chief Complaint  Patient presents with   Lower Back - Pain   HPI:  Crystal Koch is a 43 y.o. female who comes in today for planned repeat Left S1-2  Lumbar Transforaminal epidural steroid injection with fluoroscopic guidance.  The patient has failed conservative care including home exercise, medications, time and activity modification.  This injection will be diagnostic and hopefully therapeutic.  Please see requesting physician notes for further details and justification. Patient received more than 50% pain relief from prior injection.   Referring: Duwaine Pouch, FNP   ROS Otherwise per HPI.  Assessment & Plan: Visit Diagnoses:    ICD-10-CM   1. Lumbar radiculopathy  M54.16 XR C-ARM NO REPORT    Epidural Steroid injection    methylPREDNISolone  acetate (DEPO-MEDROL ) injection 40 mg      Plan: No additional findings.   Meds & Orders:  Meds ordered this encounter  Medications   methylPREDNISolone  acetate (DEPO-MEDROL ) injection 40 mg    Orders Placed This Encounter  Procedures   XR C-ARM NO REPORT   Epidural Steroid injection    Follow-up: Return for visit to requesting provider as needed.   Procedures: No procedures performed  S1 Lumbosacral Transforaminal Epidural Steroid Injection - Sub-Pedicular Approach with Fluoroscopic Guidance   Patient: Crystal Koch      Date of Birth: 03-13-1981 MRN: [id number] PCP: Delbert Clam, MD      Visit Date: 03/03/2024   Universal Protocol:    Date/Time: 03/03/2509:45 AM  Consent Given By: the patient  Position:  PRONE  Additional Comments: Vital signs were monitored before and after the procedure. Patient was prepped and draped in the usual sterile fashion. The correct patient, procedure, and site was verified.   Injection Procedure  Details:  Procedure Site One Meds Administered:  Meds ordered this encounter  Medications   methylPREDNISolone  acetate (DEPO-MEDROL ) injection 40 mg    Laterality: Left  Location/Site:  S1 Foramen   Needle size: 22 ga.  Needle type: Spinal  Needle Placement: Transforaminal  Findings:   -Comments: Excellent flow of contrast along the nerve, nerve root and into the epidural space.  Epidurogram: Contrast epidurogram showed no nerve root cut off or restricted flow pattern.  Procedure Details: After squaring off the sacral end-plate to get a true AP view, the C-arm was positioned so that the best possible view of the S1 foramen was visualized. The soft tissues overlying this structure were infiltrated with 2-3 ml. of 1% Lidocaine without Epinephrine.    The spinal needle was inserted toward the target using a trajectory view along the fluoroscope beam.  Under AP and lateral visualization, the needle was advanced so it did not puncture dura. Biplanar projections were used to confirm position. Aspiration was confirmed to be negative for CSF and/or blood. A 1-2 ml. volume of Isovue-250 was injected and flow of contrast was noted at each level. Radiographs were obtained for documentation purposes.   After attaining the desired flow of contrast documented above, a 0.5 to 1.0 ml test dose of 0.25% Marcaine was injected into each respective transforaminal space.  The patient was observed for 90 seconds post injection.  After no sensory deficits were reported, and normal lower extremity motor function was noted,   the above injectate was administered so that equal amounts of the injectate were placed at  each foramen (level) into the transforaminal epidural space.   Additional Comments:  The patient tolerated the procedure well Dressing: Band-Aid with 2 x 2 sterile gauze    Post-procedure details: Patient was observed during the procedure. Post-procedure instructions were  reviewed.  Patient left the clinic in stable condition.   Clinical History: Narrative & Impression CLINICAL DATA:  Low back pain, symptoms persist with > 6 wks treatment   EXAM: MRI LUMBAR SPINE WITHOUT CONTRAST   TECHNIQUE: Multiplanar, multisequence MR imaging of the lumbar spine was performed. No intravenous contrast was administered.   COMPARISON:  September 2023   FINDINGS: Segmentation:  Normal.   Alignment:  Stable and without listhesis.   Vertebrae: Stable and preserved vertebral body heights. No marrow edema. No suspicious lesion.   Conus medullaris and cauda equina: Conus extends to the L1-L2 level. Conus and cauda equina appear normal.   Paraspinal and other soft tissues: No new finding. Fibroid at the fundus of the uterus.   Disc levels: Intervertebral disc heights and signal are maintained. Persistent mild disc bulge at L5-S1 with superimposed left central/subarticular protrusion. No canal or left foraminal stenosis. Minor right foraminal stenosis. Narrowing of the left subarticular recess with potential compression of the traversing left S1 nerve root.   IMPRESSION: Disc protrusion at L5-S1 as seen on the prior study with potential compression of traversing left S1 nerve root.     Electronically Signed   By: Santina Blanch M.D.   On: 07/01/2023 15:56     Objective:  VS:  HT:    WT:   BMI:     BP:(!) 147/92  HR:83bpm  TEMP: ( )  RESP:  Physical Exam Vitals and nursing note reviewed.  Constitutional:      General: She is not in acute distress.    Appearance: Normal appearance. She is not ill-appearing.  HENT:     Head: Normocephalic and atraumatic.     Right Ear: External ear normal.     Left Ear: External ear normal.  Eyes:     Extraocular Movements: Extraocular movements intact.  Cardiovascular:     Rate and Rhythm: Normal rate.     Pulses: Normal pulses.  Pulmonary:     Effort: Pulmonary effort is normal. No respiratory distress.   Abdominal:     General: There is no distension.     Palpations: Abdomen is soft.  Musculoskeletal:        General: Tenderness present.     Cervical back: Neck supple.     Right lower leg: No edema.     Left lower leg: No edema.     Comments: Patient has good distal strength with no pain over the greater trochanters.  No clonus or focal weakness.  Skin:    Findings: No erythema, lesion or rash.  Neurological:     General: No focal deficit present.     Mental Status: She is alert and oriented to person, place, and time.     Sensory: No sensory deficit.     Motor: No weakness or abnormal muscle tone.     Coordination: Coordination normal.  Psychiatric:        Mood and Affect: Mood normal.        Behavior: Behavior normal.      Imaging: XR C-ARM NO REPORT Result Date: 03/03/2024 Please see Notes tab for imaging impression.

## 2024-03-03 NOTE — Progress Notes (Signed)
 Pain Scale   Average Pain 8 Patient advising she had lower back pain radiating bilaterally to both legs when moving side to side or bending over. Patient advising heat helps with pain        +Driver, -BT, -Dye Allergies.

## 2024-03-03 NOTE — Procedures (Signed)
 S1 Lumbosacral Transforaminal Epidural Steroid Injection - Sub-Pedicular Approach with Fluoroscopic Guidance   Patient: Crystal Koch      Date of Birth: Oct 01, 1980 MRN: [id number] PCP: Delbert Clam, MD      Visit Date: 03/03/2024   Universal Protocol:    Date/Time: 03/03/2509:45 AM  Consent Given By: the patient  Position:  PRONE  Additional Comments: Vital signs were monitored before and after the procedure. Patient was prepped and draped in the usual sterile fashion. The correct patient, procedure, and site was verified.   Injection Procedure Details:  Procedure Site One Meds Administered:  Meds ordered this encounter  Medications   methylPREDNISolone  acetate (DEPO-MEDROL ) injection 40 mg    Laterality: Left  Location/Site:  S1 Foramen   Needle size: 22 ga.  Needle type: Spinal  Needle Placement: Transforaminal  Findings:   -Comments: Excellent flow of contrast along the nerve, nerve root and into the epidural space.  Epidurogram: Contrast epidurogram showed no nerve root cut off or restricted flow pattern.  Procedure Details: After squaring off the sacral end-plate to get a true AP view, the C-arm was positioned so that the best possible view of the S1 foramen was visualized. The soft tissues overlying this structure were infiltrated with 2-3 ml. of 1% Lidocaine without Epinephrine.    The spinal needle was inserted toward the target using a trajectory view along the fluoroscope beam.  Under AP and lateral visualization, the needle was advanced so it did not puncture dura. Biplanar projections were used to confirm position. Aspiration was confirmed to be negative for CSF and/or blood. A 1-2 ml. volume of Isovue-250 was injected and flow of contrast was noted at each level. Radiographs were obtained for documentation purposes.   After attaining the desired flow of contrast documented above, a 0.5 to 1.0 ml test dose of 0.25% Marcaine was injected into each  respective transforaminal space.  The patient was observed for 90 seconds post injection.  After no sensory deficits were reported, and normal lower extremity motor function was noted,   the above injectate was administered so that equal amounts of the injectate were placed at each foramen (level) into the transforaminal epidural space.   Additional Comments:  The patient tolerated the procedure well Dressing: Band-Aid with 2 x 2 sterile gauze    Post-procedure details: Patient was observed during the procedure. Post-procedure instructions were reviewed.  Patient left the clinic in stable condition.

## 2024-03-24 ENCOUNTER — Ambulatory Visit (INDEPENDENT_AMBULATORY_CARE_PROVIDER_SITE_OTHER): Admitting: Licensed Clinical Social Worker

## 2024-03-24 DIAGNOSIS — F4310 Post-traumatic stress disorder, unspecified: Secondary | ICD-10-CM

## 2024-03-24 DIAGNOSIS — F3342 Major depressive disorder, recurrent, in full remission: Secondary | ICD-10-CM

## 2024-03-24 DIAGNOSIS — F411 Generalized anxiety disorder: Secondary | ICD-10-CM

## 2024-03-24 NOTE — Progress Notes (Signed)
 THERAPIST PROGRESS NOTE  Virtual Visit via Video Note  I connected with Crystal Koch on 03/24/24 at 11:00 AM EDT by a video enabled telemedicine application and verified that I am speaking with the correct person using two identifiers.  Location: Patient: [facility]  Provider: [facility]    I discussed the limitations of evaluation and management by telemedicine and the availability of in person appointments. The patient expressed understanding and agreed to proceed.  I discussed the assessment and treatment plan with the patient. The patient was provided an opportunity to ask questions and all were answered. The patient agreed with the plan and demonstrated an understanding of the instructions.   The patient was advised to call back or seek an in-person evaluation if the symptoms worsen or if the condition fails to improve as anticipated.  I provided 30 minutes of non-face-to-face time during this encounter.   Juliene GORMAN Patee, LCSW   Participation Level: Active  Behavioral Response: CasualAlertAngry and Anxious  Type of Therapy: Individual Therapy  Treatment Goals addressed:  Active     Anxiety     LTG: Crystal Koch will score less than 5 on the Generalized Anxiety Disorder 7 Scale (GAD-7)  (Completed/Met)     Start:  10/09/23    Expected End:  04/11/24    Resolved:  03/24/24      STG: Crystal will participate in at least 80% of scheduled individual psychotherapy sessions  (Progressing)     Start:  10/09/23    Expected End:  04/11/24         STG: Crystal will complete at least 80% of assigned homework  (Progressing)     Start:  10/09/23    Expected End:  04/11/24         STG: Crystal will practice problem solving skills 3 times per week for the next 4 weeks.  (Progressing)     Start:  10/09/23    Expected End:  04/11/24         STG: Report a decrease in anxiety symptoms as evidenced by an overall reduction in anxiety score by a minimum of 25% on the  Generalized Anxiety Disorder Scale (GAD-7) (Completed/Met)     Start:  10/09/23    Expected End:  04/11/24    Resolved:  03/24/24      Review results of GAD-7 with Crystal to track progress     Start:  10/09/23         Discuss risks and benefits of medication treatment options for this problem and prescribe as indicated (Completed)     Start:  10/09/23    End:  11/12/23      Encourage Crystal to take psychotropic medication(s) as prescribed (Completed)     Start:  10/09/23    End:  11/12/23      Work with Crystal to track symptoms, triggers, and/or skill use through a mood chart, diary card, or journal (Completed)     Start:  10/09/23    End:  11/12/23      Perform psychoeducation regarding anxiety disorders (Completed)     Start:  10/09/23    End:  12/03/23      Provide Crystal with educational information and reading material on anxiety, its causes, and symptoms.  (Completed)     Start:  10/09/23    End:  12/03/23        ProgressTowards Goals: Progressing  Interventions: Motivational Interviewing and Supportive  Summary: Crystal Koch is a 43 y.o. female  who presents with     Suicidal/Homicidal: Nowithout intent/plan  Therapist Response:    Crystal Koch was alert and oriented x 5.  She was pleasant, cooperative, maintained good eye contact.  Patient engaged well in therapy session and was dressed casually.  Crystal Koch presented with euthymic mood\affect.  Patient comes in today with limited stressors as evidenced by work, personal life, and family life improving.  This is as evidenced by taking medications for depression and anxiety consistently. Crystal Koch endorsees  an overall decrease in depression and anxiety.  She does report some stressors for work but since setting some healthy boundaries with her second job things have been much better as she is able to engage in more self-care.  Patient reports utilizing self-care for yoga, power walking, and change of diet.  Crystal Koch does have  frustration with her dieting.  She reports there is limited evidence that it has improved her weight as it continues to fluctuate.  Intervention: LCSW educated patient on benefits of balanced diet.  LCSW educated patient on utilization of dietitians at [facility].  LCSW educated patient on free applications to track calories such as my fitness pal.  LCSW utilized psychoanalytic therapy for patient to express thoughts, feelings and concerns and nonjudgmental environment.  LCSW utilized supportive therapy for praise and encouragement.  Plan: LCSW reviewed options for further therapeutic services and patient was agreeable to be seen every 4 weeks from every 3 weeks.  LCSW advised patient that he would be leaving the practice as of December 15 2 another [place] location.  LCSW provided patient with alternatives such as transferring therapist at [facility] or transitioning to [place] location if insurance would cover that office.  Patient wanted until next session to think it over and then come back with a determination.   Plan: Return again in 3 weeks.  Diagnosis: PTSD (post-traumatic stress disorder)  GAD (generalized anxiety disorder)  MDD (major depressive disorder), recurrent, in full remission  Collaboration of Care: Other Continued therapy and medication mgnt at [facility]   Patient/Guardian was advised Release of Information must be obtained prior to any record release in order to collaborate their care with an outside provider. Patient/Guardian was advised if they have not already done so to contact the registration department to sign all necessary forms in order for us  to release information regarding their care.   Consent: Patient/Guardian gives verbal consent for treatment and assignment of benefits for services provided during this visit. Patient/Guardian expressed understanding and agreed to proceed.   Juliene GORMAN Patee,  LCSW 03/24/2024

## 2024-03-27 ENCOUNTER — Telehealth: Payer: Self-pay

## 2024-03-27 NOTE — Patient Instructions (Signed)

## 2024-03-27 NOTE — Progress Notes (Signed)
 PATIENT: Crystal Koch DOB: 1981/05/26  REASON FOR VISIT: follow up HISTORY FROM: patient  Chief Complaint  Patient presents with   RM1/AUTOPAP    Pt is here Alone. Pt states she has ups and downs with her AutoPAP Machine.      HISTORY OF PRESENT ILLNESS:  03/31/24 ALL:  Crystal Koch is a 43 y.o. female here today for follow up for OSA on CPAP.  She was seen by Dr Buck 09/2023 for concerns of snoring, excessive sleepiness and witnessed apneic events. PSG 12/30/2023 showed mild obstructive sleep apnea, more pronounced during REM sleep and in the supine position, with a total AHI of 13.8/hour, and O2 nadir of 88%. AutoPAP advised. Since, she reports doing fairly well on therapy. She is using CPAP daily for about 6 hours, on average. She does report waking up with the mask in the floor at times. She denies any specific concerns. She is using a FFM and does feel like the nose section of mask is really tight. She does report feeling more rested. She wakes feeling better refreshed. She is not napping as much. BP has been elevated. She did not take BP meds last night. She is trying to stop smoking. She may go a couple of days without smoking.     HISTORY: (copied from Dr Obie previous note)  Dear Lauraine,  I saw your patient, Crystal Koch, upon your kind request in my sleep clinic today for initial consultation of her sleep disorder, in particular, concern for underlying obstructive sleep apnea.  The patient is unaccompanied today.  As you know, Ms. Colclasure is a 43 year-old female with an underlying medical history of asthma, sciatica, depression, anxiety, PTSD, and overweight state, who reports snoring and excessive daytime somnolence as well as witnessed apneas, per S.O.  Her Epworth sleepiness score is 18 out of 24, fatigue severity score is 52 out of 63.  I reviewed your video visit note from 09/03/2023. She lives with her 23 year old son.  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  She typically comes all around 9 PM and goes to bed around midnight.  Rise time is between 6 and 6:30 AM.  She has recently cut back on her coffee intake but previously would drink up to 4 cups of coffee per day.  She is trying to quit smoking.  She currently smokes up to 3 cigarettes/day.  She has no nightly nocturia.  She has occasional morning headaches and nocturnal cough.  Her snoring has become louder in the past year, sleep disturbance and sleepiness during the day have been ongoing for at least 1 or 2 years.  She is not aware of any family history of sleep apnea.  She has a dog in the household.  The dog does not sleep in her bedroom at night.  She does not have a TV in her bedroom.  She drinks alcohol rarely, on special occasions.  She is working on weight loss.   REVIEW OF SYSTEMS: Out of a complete 14 system review of symptoms, the patient complains only of the following symptoms, sleepiness, fatigue and all other reviewed systems are negative.  ESS: 16/24  ALLERGIES: No Known Allergies  HOME MEDICATIONS: Outpatient Medications Prior to Visit  Medication Sig Dispense Refill   atorvastatin  (LIPITOR) 40 MG tablet Take 1 tablet (40 mg total) by mouth daily. 90 tablet 1   buPROPion  (WELLBUTRIN  XL) 150 MG 24 hr tablet  Take 1 tablet (150 mg total) by mouth daily. 90 tablet 0   chlorhexidine  (PERIDEX ) 0.12 % solution Use as directed 15 mLs in the mouth or throat 2 (two) times daily as needed. Swish and spit twice daily as needed 120 mL 0   Cholecalciferol  (VITAMIN D3) 10 MCG (400 UNIT) tablet Take 1 tablet (400 Units total) by mouth daily. 100 tablet 1   cyclobenzaprine  (FLEXERIL ) 5 MG tablet Take 1 tablet (5 mg total) by mouth 3 (three) times daily as needed for muscle spasms. 30 tablet 2   fluticasone  (FLONASE ) 50 MCG/ACT nasal spray Place 2 sprays into both nostrils daily. 16 g 6   gabapentin  (NEURONTIN ) 100 MG capsule Take 1 capsule (100 mg  total) by mouth 2 (two) times daily. 60 capsule 3   hydrOXYzine  (ATARAX ) 25 MG tablet Take 1-2 tablets (25-50 mg total) by mouth 2 (two) times daily as needed (acute anxiety). 60 tablet 1   meloxicam  (MOBIC ) 15 MG tablet Take 15 mg by mouth daily.     Misc. Devices MISC Auto PAP 5 to 15 cm of water.  Diagnosis-obstructive sleep apnea 1 each 0   omeprazole  (PRILOSEC) 40 MG capsule Take 1 capsule (40 mg total) by mouth daily. 90 capsule 1   prazosin  (MINIPRESS ) 1 MG capsule Take 1 capsule (1 mg total) by mouth at bedtime. 90 capsule 0   clindamycin  (CLEOCIN ) 300 MG capsule Take 1 capsule (300 mg total) by mouth 3 (three) times daily. (Patient not taking: Reported on 03/31/2024) 21 capsule 0   diazepam  (VALIUM ) 5 MG tablet Take 1 by mouth 1 hour  pre-procedure with very light food. Do not drive motor vehicle. (Patient not taking: Reported on 03/31/2024) 1 tablet 0   fluconazole  (DIFLUCAN ) 150 MG tablet Take 1 tablet PO once. Repeat in 3 days if needed. (Patient not taking: Reported on 03/31/2024) 2 tablet 0   naproxen  (NAPROSYN ) 500 MG tablet Take 1 tablet (500 mg total) by mouth 2 (two) times daily with a meal. (Patient not taking: Reported on 03/31/2024) 20 tablet 0   No facility-administered medications prior to visit.    PAST MEDICAL HISTORY: Past Medical History:  Diagnosis Date   Asthma 06/12/1984   Depression    PTSD (post-traumatic stress disorder)    Sciatica 06/12/2009   since son was born. exacerbated every winter.     PAST SURGICAL HISTORY: Past Surgical History:  Procedure Laterality Date   CESAREAN SECTION  2011    FAMILY HISTORY: Family History  Problem Relation Age of Onset   Thyroid disease Mother    Mental illness Mother    Cancer Father    Hypertension Maternal Uncle    Cancer Maternal Grandmother    Diabetes Neg Hx     SOCIAL HISTORY: Social History   Socioeconomic History   Marital status: Single    Spouse name: Not on file   Number of children: 1    Years of education: college    Highest education level: Associate degree: occupational, Scientist, product/process development, or vocational program  Occupational History   Occupation: Homemaker   Tobacco Use   Smoking status: Former    Current packs/day: 0.00    Average packs/day: 0.3 packs/day for 16.0 years (4.0 ttl pk-yrs)    Types: Cigarettes    Start date: 10/03/2023    Quit date: 10/22/2023    Years since quitting: 0.4   Smokeless tobacco: Never  Vaping Use   Vaping status: Never Used  Substance and Sexual Activity  Alcohol use: No   Drug use: Yes    Types: Marijuana    Comment: prior microdosing of mushrooms twice weekly stopped as of 09/11/23. Marijuana: current use of CBD/THC   Sexual activity: Yes    Partners: Male    Birth control/protection: None  Other Topics Concern   Not on file  Social History Narrative   Lives at home with 4 yo son.    Social Drivers of Health   Financial Resource Strain: Medium Risk (12/29/2023)   Overall Financial Resource Strain (CARDIA)    Difficulty of Paying Living Expenses: Somewhat hard  Food Insecurity: Food Insecurity Present (12/29/2023)   Hunger Vital Sign    Worried About Running Out of Food in the Last Year: Often true    Ran Out of Food in the Last Year: Often true  Transportation Needs: No Transportation Needs (12/29/2023)   PRAPARE - Administrator, Civil Service (Medical): No    Lack of Transportation (Non-Medical): No  Recent Concern: Transportation Needs - Unmet Transportation Needs (10/09/2023)   PRAPARE - Transportation    Lack of Transportation (Medical): Yes    Lack of Transportation (Non-Medical): Yes  Physical Activity: Sufficiently Active (12/29/2023)   Exercise Vital Sign    Days of Exercise per Week: 7 days    Minutes of Exercise per Session: 30 min  Stress: No Stress Concern Present (12/29/2023)   [employer] of Occupational Health - Occupational Stress Questionnaire    Feeling of Stress: Only a little  Recent Concern:  Stress - Stress Concern Present (10/09/2023)   [employer] of Occupational Health - Occupational Stress Questionnaire    Feeling of Stress : Rather much  Social Connections: Moderately Integrated (12/29/2023)   Social Connection and Isolation Panel    Frequency of Communication with Friends and Family: More than three times a week    Frequency of Social Gatherings with Friends and Family: More than three times a week    Attends Religious Services: More than 4 times per year    Active Member of [employer] or Organizations: Yes    Attends Engineer, structural: More than 4 times per year    Marital Status: Separated  Recent Concern: Social Connections - Moderately Isolated (10/09/2023)   Social Connection and Isolation Panel    Frequency of Communication with Friends and Family: More than three times a week    Frequency of Social Gatherings with Friends and Family: More than three times a week    Attends Religious Services: Never    Database administrator or Organizations: Yes    Attends Engineer, structural: More than 4 times per year    Marital Status: Separated  Intimate Partner Violence: Not At Risk (10/29/2023)   Humiliation, Afraid, Rape, and Kick questionnaire    Fear of Current or Ex-Partner: No    Emotionally Abused: No    Physically Abused: No    Sexually Abused: No     PHYSICAL EXAM  Vitals:   03/31/24 0840  BP: (!) 149/100  Pulse: 100  SpO2: 98%  Weight: 169 lb (76.7 kg)  Height: 5' 2 (1.575 m)   Body mass index is 30.91 kg/m.  Generalized: Well developed, in no acute distress  Cardiology: normal rate and rhythm, no murmur noted Respiratory: clear to auscultation bilaterally  Neurological examination  Mentation: Alert oriented to time, place, history taking. Follows all commands speech and language fluent Cranial nerve II-XII: Pupils were equal round reactive to light. Extraocular  movements were full, visual field were full  Motor: The motor  testing reveals 5 over 5 strength of all 4 extremities. Good symmetric motor tone is noted throughout.  Gait and station: Gait is normal.    DIAGNOSTIC DATA (LABS, IMAGING, TESTING) - I reviewed patient records, labs, notes, testing and imaging myself where available.      No data to display           Lab Results  Component Value Date   WBC 7.8 06/25/2023   HGB 15.3 06/25/2023   HCT 47.2 (H) 06/25/2023   MCV 88 06/25/2023   PLT 364 06/25/2023      Component Value Date/Time   NA 138 06/25/2023 1210   K 4.4 06/25/2023 1210   CL 100 06/25/2023 1210   CO2 25 06/25/2023 1210   GLUCOSE 78 06/25/2023 1210   GLUCOSE 85 06/19/2014 0954   BUN 11 06/25/2023 1210   CREATININE 0.66 06/25/2023 1210   CREATININE 0.74 06/19/2014 0954   CALCIUM  9.4 06/25/2023 1210   PROT 7.2 06/25/2023 1210   ALBUMIN 4.6 06/25/2023 1210   AST 19 06/25/2023 1210   ALT 22 06/25/2023 1210   ALKPHOS 79 06/25/2023 1210   BILITOT 0.2 06/25/2023 1210   GFRNONAA >89 06/19/2014 0954   GFRAA >89 06/19/2014 0954   Lab Results  Component Value Date   CHOL 211 (H) 01/02/2024   HDL 47 01/02/2024   LDLCALC 155 (H) 01/02/2024   TRIG 51 01/02/2024   CHOLHDL 5.0 (H) 03/24/2021   Lab Results  Component Value Date   HGBA1C 5.8 (H) 01/02/2024   No results found for: VITAMINB12 Lab Results  Component Value Date   TSH 1.460 08/17/2021     ASSESSMENT AND PLAN 43 y.o. year old female  has a past medical history of Asthma (06/12/1984), Depression, PTSD (post-traumatic stress disorder), and Sciatica (06/12/2009). here with     ICD-10-CM   1. OSA on CPAP  G47.33 For home use only DME continuous positive airway pressure (CPAP)    2. Hypertension, unspecified type  I10     3. Current smoker  F17.200       Monique Marcin is doing well on CPAP therapy. Compliance report reveals excellent daily and optimal four hour compliance. She does have an air leak. Mask refitting orders placed. She was encouraged to  continue using CPAP nightly and for greater than 4 hours each night. We will update supply orders as indicated. Risks of untreated sleep apnea review and education materials provided. We discussed need for close monitoring of BP. She was encouraged to take medicaitons as prescribed and continue smoking cessation efforts. Healthy lifestyle habits encouraged. She will follow up in 1 year, sooner if needed. She verbalizes understanding and agreement with this plan.    Orders Placed This Encounter  Procedures   For home use only DME continuous positive airway pressure (CPAP)    Mask refitting due to leak    Length of Need:   Lifetime    Patient has OSA or probable OSA:   Yes    Is the patient currently using CPAP in the home:   Yes    Settings:   Other see comments    CPAP supplies needed:   Mask, headgear, cushions, filters, heated tubing and water chamber     No orders of the defined types were placed in this encounter.   I personally spent a total of 30 minutes in the care of the patient today including preparing  to see the patient, getting/reviewing separately obtained history, performing a medically appropriate exam/evaluation, counseling and educating, placing orders, referring and communicating with other health care professionals, documenting clinical information in the EHR, independently interpreting results, and communicating results.   Greig Forbes, FNP-C 03/31/2024, 9:11 AM [facility] [address] [place], [place] [address] [phone]

## 2024-03-27 NOTE — Telephone Encounter (Signed)
 Called pt and asked her to bring her CPAP Machine to her appointment on Monday.

## 2024-03-31 ENCOUNTER — Ambulatory Visit: Admitting: Family Medicine

## 2024-03-31 ENCOUNTER — Encounter: Payer: Self-pay | Admitting: Family Medicine

## 2024-03-31 VITALS — BP 149/100 | HR 100 | Ht 62.0 in | Wt 169.0 lb

## 2024-03-31 DIAGNOSIS — F17200 Nicotine dependence, unspecified, uncomplicated: Secondary | ICD-10-CM | POA: Diagnosis not present

## 2024-03-31 DIAGNOSIS — G4733 Obstructive sleep apnea (adult) (pediatric): Secondary | ICD-10-CM | POA: Diagnosis not present

## 2024-03-31 DIAGNOSIS — I10 Essential (primary) hypertension: Secondary | ICD-10-CM | POA: Diagnosis not present

## 2024-04-07 ENCOUNTER — Ambulatory Visit: Admitting: Sports Medicine

## 2024-04-14 ENCOUNTER — Ambulatory Visit: Admitting: Sports Medicine

## 2024-04-14 ENCOUNTER — Ambulatory Visit (INDEPENDENT_AMBULATORY_CARE_PROVIDER_SITE_OTHER): Admitting: Licensed Clinical Social Worker

## 2024-04-14 ENCOUNTER — Encounter: Payer: Self-pay | Admitting: Sports Medicine

## 2024-04-14 ENCOUNTER — Encounter: Payer: Self-pay | Admitting: Radiology

## 2024-04-14 DIAGNOSIS — M79642 Pain in left hand: Secondary | ICD-10-CM

## 2024-04-14 DIAGNOSIS — F4310 Post-traumatic stress disorder, unspecified: Secondary | ICD-10-CM | POA: Diagnosis not present

## 2024-04-14 DIAGNOSIS — E559 Vitamin D deficiency, unspecified: Secondary | ICD-10-CM | POA: Diagnosis not present

## 2024-04-14 DIAGNOSIS — M25642 Stiffness of left hand, not elsewhere classified: Secondary | ICD-10-CM | POA: Diagnosis not present

## 2024-04-14 DIAGNOSIS — F17200 Nicotine dependence, unspecified, uncomplicated: Secondary | ICD-10-CM | POA: Diagnosis not present

## 2024-04-14 DIAGNOSIS — F3342 Major depressive disorder, recurrent, in full remission: Secondary | ICD-10-CM

## 2024-04-14 DIAGNOSIS — M25641 Stiffness of right hand, not elsewhere classified: Secondary | ICD-10-CM | POA: Diagnosis not present

## 2024-04-14 DIAGNOSIS — M79641 Pain in right hand: Secondary | ICD-10-CM | POA: Diagnosis not present

## 2024-04-14 DIAGNOSIS — F411 Generalized anxiety disorder: Secondary | ICD-10-CM | POA: Diagnosis not present

## 2024-04-14 NOTE — Progress Notes (Signed)
 Crystal Koch - 43 y.o. female MRN [id number]  Date of birth: 05/03/1981  Office Visit Note: Visit Date: 04/14/2024 PCP: Delbert Clam, MD Referred by: Delbert Clam, MD  Subjective: Chief Complaint  Patient presents with   Right Hand - Follow-up   Left Hand - Follow-up   HPI: Crystal Koch is a pleasant 43 y.o. female who presents today for follow-up of bilateral hand pain and stiffness.  I did see her in the past and she was having pain as well as reporting some triggering episodes but this was not reproduced on exam.  She did report a history of numbness and tingling so did undergo nerve conduction study/EMG back on 11/02/2023, results for this demonstrated no evidence of carpal tunnel syndrome or nerve abnormality.  She continues with aching pain in both of her hands, pointing more so to the PIP and MCP joints.  She has difficulty opening and closing her hands although no triggering noted.  She does have rather significant morning stiffness which she states lasted about 30 to 45 minutes.  Running her fingers under hot water does help relieve her pain.  She is taking meloxicam  15 mg daily as well as gabapentin  100 mg twice daily.  In terms of rheumatologic/family history, she has a mother and maternal grandmother who both were diagnosed with rheumatoid arthritis.  Tobacco use disorder - has been smoking since the age of 58, she is in the process of quitting smoking.  Recently was smoking about 1 pack/week.  She did most recently stop smoking altogether in January.  She is managed on Wellbutrin  to help with cessation.  Vitamin D  levels in past were low (14), most recent 2.5 years ago was 72.5.  Pertinent ROS were reviewed with the patient and found to be negative unless otherwise specified above in HPI.   Assessment & Plan: Visit Diagnoses:  1. Bilateral hand pain   2. Tobacco use disorder   3. Morning joint stiffness of right hand   4. Morning joint stiffness of left hand   5.  Vitamin D  deficiency    Plan: Impression is bilateral hand pain with x-ray findings previously obtained with early PIP joint arthritic change but no significant osteoarthritis.  Her symptoms, morning stiffness and notable past medical history are concerning for rheumatologic conditions, such as rheumatoid arthritis.  She has not had any laboratory workup for this, we will obtain labs today including CBC, ESR, CRP, ANA, RF, anti-CCP antibodies.  Given her history of vitamin D  deficiency, we will repeat this test as well.  For her symptom relief, she may continue her meloxicam  15 mg daily as well as her gabapentin  100 mg twice daily.  Did discuss her tobacco use disorder which she is currently not smoking at the time, commended her on this.  We did discuss tobacco cessation and reported benefits for her and/following and overall health quality with cessation.  She will continue Wellbutrin  150 mg daily for this.  Once we obtain labs, I will send her a message with next steps.  Follow-up: Return for Will Mychart message her once lab results return.   The patient currently smokes about 0 cigarettes, previously smoked 1 pack per week - is in active process of cessation. We did discuss the patient's tobacco use and the importance of tobacco cessation as this adversely affects arthritis and bone-related conditions. Smoking cessation techniques were discussed - will continue Wellbutrin  and smoking cessation self-control techniques. The patient has agreed to work on this. Approximately 3 minutes  were spent on discussion on tobacco use and cessation in the office today.   Meds & Orders: No orders of the defined types were placed in this encounter.   Orders Placed This Encounter  Procedures   Sed Rate (ESR)   C-reactive protein   Antinuclear Antib (ANA)   Cyclic citrul peptide antibody, IgG   Rheumatoid Factor   Vitamin D  (25 hydroxy)   CBC with Differential     Procedures: No procedures performed       Clinical History:   She reports that she quit smoking about 5 months ago. Her smoking use included cigarettes. She started smoking about 6 months ago. She has a 4 pack-year smoking history. She has never used smokeless tobacco.  Recent Labs    01/02/24 0930  HGBA1C 5.8*   EMG & NCV Findings: Evaluation of the left median (across palm) sensory and the right median (across palm) sensory nerves showed no response (Palm).  All remaining nerves (as indicated in the following tables) were within normal limits.  All left vs. right side differences were within normal limits.     All examined muscles (as indicated in the following table) showed no evidence of electrical instability.     Impression: Essentially NORMAL electrodiagnostic study of both upper limbs.  There is no significant electrodiagnostic evidence of nerve entrapment, brachial plexopathy or cervical radiculopathy.     As you know, purely sensory or demyelinating radiculopathies and chemical radiculitis may not be detected with this particular electrodiagnostic study. **This electrodiagnostic study cannot rule out small fiber polyneuropathy and dysesthesias from central pain syndromes such as stroke or central pain sensitization syndromes such as fibromyalgia.  Myotomal referral pain from trigger points is also not excluded.   Recommendations: 1.  Follow-up with referring physician. 2.  Continue current management of symptoms.   ___________________________ Prentice Masters FAAPMR Board Certified, American Board of Physical Medicine and Rehabilitation     Nerve Conduction Studies Anti Sensory Summary Table    Stim Site NR Peak (ms) Norm Peak (ms) P-T Amp (V) Norm P-T Amp Site1 Site2 Delta-P (ms) Dist (cm) Vel (m/s) Norm Vel (m/s)  Left Median Acr Palm Anti Sensory (2nd Digit)  30.8C  Wrist    2.7 <3.6 62.5 >10 Wrist Palm   0.0      Palm *NR   <2.0                  Right Median Acr Palm Anti Sensory (2nd Digit)  30.5C  Wrist     2.9 <3.6 41.6 >10 Wrist Palm   0.0      Palm *NR   <2.0                  Left Radial Anti Sensory (Base 1st Digit)  31.4C  Wrist    1.9 <3.1 51.4   Wrist Base 1st Digit 1.9 0.0      Right Radial Anti Sensory (Base 1st Digit)  31.7C  Wrist    1.9 <3.1 50.6   Wrist Base 1st Digit 1.9 0.0      Left Ulnar Anti Sensory (5th Digit)  31.4C  Wrist    3.2 <3.7 24.3 >15.0 Wrist 5th Digit 3.2 14.0 44 >38  Right Ulnar Anti Sensory (5th Digit)  32C  Wrist    2.9 <3.7 29.2 >15.0 Wrist 5th Digit 2.9 14.0 48 >38    Motor Summary Table    Stim Site NR Onset (ms) Norm Onset (ms) O-P Amp (mV) Norm  O-P Amp Site1 Site2 Delta-0 (ms) Dist (cm) Vel (m/s) Norm Vel (m/s)  Left Median Motor (Abd Poll Brev)  31.8C  Wrist    2.5 <4.2 8.3 >5 Elbow Wrist 3.4 20.0 59 >50  Elbow    5.9   8.5                Right Median Motor (Abd Poll Brev)  32C  Wrist    2.7 <4.2 9.2 >5 Elbow Wrist 3.6 20.0 56 >50  Elbow    6.3   9.3                Left Ulnar Motor (Abd Dig Min)  32C  Wrist    2.3 <4.2 8.2 >3 B Elbow Wrist 2.9 18.0 62 >53  B Elbow    5.2   8.2   A Elbow B Elbow 1.0 9.0 90 >53  A Elbow    6.2   8.2                Right Ulnar Motor (Abd Dig Min)  32.2C  Wrist    2.3 <4.2 7.5 >3 B Elbow Wrist 2.9 19.0 66 >53  B Elbow    5.2   8.0   A Elbow B Elbow 1.1 9.5 86 >53  A Elbow    6.3   8.2                  EMG    Side Muscle Nerve Root Ins Act Fibs Psw Amp Dur Poly Recrt Int Bruna Comment  Left Abd Poll Brev Median C8-T1 Nml Nml Nml Nml Nml 0 Nml Nml    Left 1stDorInt Ulnar C8-T1 Nml Nml Nml Nml Nml 0 Nml Nml    Left PronatorTeres Median C6-7 Nml Nml Nml Nml Nml 0 Nml Nml    Left Biceps Musculocut C5-6 Nml Nml Nml Nml Nml 0 Nml Nml    Left Deltoid Axillary C5-6 Nml Nml Nml Nml Nml 0 Nml Nml    Right Abd Poll Brev Median C8-T1 Nml Nml Nml Nml Nml 0 Nml Nml    Right 1stDorInt Ulnar C8-T1 Nml Nml Nml Nml Nml 0 Nml Nml    Right PronatorTeres Median C6-7 Nml Nml Nml Nml Nml 0 Nml Nml    Right Biceps Musculocut C5-6  Nml Nml Nml Nml Nml 0 Nml Nml    Right Deltoid Axillary C5-6 Nml Nml Nml Nml Nml 0 Nml Nml        Nerve Conduction Studies Anti Sensory Left/Right Comparison    Stim Site L Lat (ms) R Lat (ms) L-R Lat (ms) L Amp (V) R Amp (V) L-R Amp (%) Site1 Site2 L Vel (m/s) R Vel (m/s) L-R Vel (m/s)  Median Acr Palm Anti Sensory (2nd Digit)  30.8C  Wrist 2.7 2.9 0.2 62.5 41.6 33.4 Wrist Palm        Palm                        Radial Anti Sensory (Base 1st Digit)  31.4C  Wrist 1.9 1.9 0.0 51.4 50.6 1.6 Wrist Base 1st Digit        Ulnar Anti Sensory (5th Digit)  31.4C  Wrist 3.2 2.9 0.3 24.3 29.2 16.8 Wrist 5th Digit 44 48 4    Motor Left/Right Comparison    Stim Site L Lat (ms) R Lat (ms) L-R Lat (ms) L Amp (mV) R Amp (mV) L-R Amp (%) Site1 Site2 L Vel (  m/s) R Vel (m/s) L-R Vel (m/s)  Median Motor (Abd Poll Brev)  31.8C  Wrist 2.5 2.7 0.2 8.3 9.2 9.8 Elbow Wrist 59 56 3  Elbow 5.9 6.3 0.4 8.5 9.3 8.6            Ulnar Motor (Abd Dig Min)  32C  Wrist 2.3 2.3 0.0 8.2 7.5 8.5 B Elbow Wrist 62 66 4  B Elbow 5.2 5.2 0.0 8.2 8.0 2.4 A Elbow B Elbow 90 86 4  A Elbow 6.2 6.3 0.1 8.2 8.2 0.0               Objective:   Physical Exam  Gen: Well-appearing, in no acute distress; non-toxic CV: Well-perfused. Warm.  Resp: Breathing unlabored on room air; no wheezing. Psych: Fluid speech in conversation; appropriate affect; normal thought process  Ortho Exam - Bilateral hands: No redness swelling or effusion.  There is no dactylitis present.  There is tenderness to palpation of the 1st through 4th PIP joints on the right =1-3 on the left hand.   Imaging: No results found.  Past Medical/Family/Surgical/Social History: Medications & Allergies reviewed per EMR, new medications updated. Patient Active Problem List   Diagnosis Date Noted   Obstructive sleep apnea 01/04/2024   Hyperlipidemia 01/02/2024   Prediabetes 01/02/2024   PTSD (post-traumatic stress disorder) 09/03/2023   GAD (generalized  anxiety disorder) 09/03/2023   MDD (major depressive disorder), recurrent, in full remission 09/03/2023   Use of cannabis 09/03/2023   Anxiety 05/16/2023   Positive depression screening 04/18/2023   Lumbar radiculopathy 04/18/2023   Acute non-recurrent sinusitis 05/31/2021   Vitamin D  deficiency 06/22/2014   Sciatica of left side 06/19/2014   Current smoker 06/19/2014   Past Medical History:  Diagnosis Date   Asthma 06/12/1984   Depression    PTSD (post-traumatic stress disorder)    Sciatica 06/12/2009   since son was born. exacerbated every winter.    Family History  Problem Relation Age of Onset   Thyroid disease Mother    Mental illness Mother    Cancer Father    Hypertension Maternal Uncle    Cancer Maternal Grandmother    Diabetes Neg Hx    Past Surgical History:  Procedure Laterality Date   CESAREAN SECTION  2011   Social History   Occupational History   Occupation: Homemaker   Tobacco Use   Smoking status: Former    Current packs/day: 0.00    Average packs/day: 0.3 packs/day for 16.0 years (4.0 ttl pk-yrs)    Types: Cigarettes    Start date: 10/03/2023    Quit date: 10/22/2023    Years since quitting: 0.4   Smokeless tobacco: Never  Vaping Use   Vaping status: Never Used  Substance and Sexual Activity   Alcohol use: No   Drug use: Yes    Types: Marijuana    Comment: prior microdosing of mushrooms twice weekly stopped as of 09/11/23. Marijuana: current use of CBD/THC   Sexual activity: Yes    Partners: Male    Birth control/protection: None

## 2024-04-14 NOTE — Progress Notes (Signed)
 THERAPIST PROGRESS NOTE  Virtual Visit via Video Note  I connected with Crystal Koch on 04/14/24 at 11:00 AM EST by a video enabled telemedicine application and verified that I am speaking with the correct person using two identifiers.  Location: Patient: Crystal Koch  Provider: [facility]    I discussed the limitations of evaluation and management by telemedicine and the availability of in person appointments. The patient expressed understanding and agreed to proceed.      I discussed the assessment and treatment plan with the patient. The patient was provided an opportunity to ask questions and all were answered. The patient agreed with the plan and demonstrated an understanding of the instructions.   The patient was advised to call back or seek an in-person evaluation if the symptoms worsen or if the condition fails to improve as anticipated.  I provided 30 minutes of non-face-to-face time during this encounter.   Juliene GORMAN Patee, LCSW   Participation Level: Active  Behavioral Response: CasualAlertAnxious and Depressed  Type of Therapy: Individual Therapy  Treatment Goals addressed:  Active     Anxiety     LTG: Crystal Koch will score less than 5 on the Generalized Anxiety Disorder 7 Scale (GAD-7)  (Completed/Met)     Start:  10/09/23    Expected End:  04/11/24    Resolved:  03/24/24      STG: Crystal will participate in at least 80% of scheduled individual psychotherapy sessions  (Completed/Met)     Start:  10/09/23    Expected End:  04/11/24    Resolved:  04/14/24      STG: Crystal will complete at least 80% of assigned homework  (Completed/Met)     Start:  10/09/23    Expected End:  04/11/24    Resolved:  04/14/24      STG: Crystal will practice problem solving skills 3 times per week for the next 4 weeks.  (Completed/Met)     Start:  10/09/23    Expected End:  04/11/24    Resolved:  04/14/24      STG: Report a decrease in anxiety symptoms as evidenced  by an overall reduction in anxiety score by a minimum of 25% on the Generalized Anxiety Disorder Scale (GAD-7) (Completed/Met)     Start:  10/09/23    Expected End:  04/11/24    Resolved:  03/24/24      Review results of GAD-7 with Crystal to track progress     Start:  10/09/23         Discuss risks and benefits of medication treatment options for this problem and prescribe as indicated (Completed)     Start:  10/09/23    End:  11/12/23      Encourage Crystal to take psychotropic medication(s) as prescribed (Completed)     Start:  10/09/23    End:  11/12/23      Work with Crystal to track symptoms, triggers, and/or skill use through a mood chart, diary card, or journal (Completed)     Start:  10/09/23    End:  11/12/23      Perform psychoeducation regarding anxiety disorders (Completed)     Start:  10/09/23    End:  12/03/23      Provide Crystal with educational information and reading material on anxiety, its causes, and symptoms.  (Completed)     Start:  10/09/23    End:  12/03/23        ProgressTowards Goals: Progressing  Interventions:  CBT, Motivational Interviewing, and Supportive   Suicidal/Homicidal: Nowithout intent/plan  Therapist Response:   S: Subjective Crystal Koch was alert and oriented x5, pleasant, cooperative, and maintained good eye contact. She engaged well during the therapy session and was casually dressed. Patient presented with a euthymic mood and affect.  Patient reported that "everything has been going well." LCSW discussed the upcoming transfer to the [place] office. Patient chose to discharge from therapeutic services at this time, with the option to return in the future should new stressors or mental health symptoms arise. LCSW provided instructions and information regarding the process for reinitiating services if needed.  Patient stated that she would like to continue care with this LCSW in the future. LCSW provided contact information for the  [place] office. LCSW also informed patient that her psychiatrist will be leaving at the end of November; patient verbalized understanding that she will have one final appointment with the psychiatrist before transitioning to a new medication provider.  Patient reported adherence to her prescribed medication regimen and stated she is managing significantly better, noting a decrease in anxiety and depressive symptoms. She described setting healthy boundaries, prioritizing self-care, and focusing on her immediate family.  O: Objective  Alert and oriented x5.  Pleasant, cooperative, good eye contact.  Casually dressed, engaged appropriately throughout session.  Mood and affect euthymic and congruent.  Insight and judgment appear good.  A: Assessment Patient demonstrates improved mood stability and decreased anxiety and depression symptoms. She reports adherence to medication and effective coping through boundary setting and self-care. Patient appears stable and ready for discharge from therapy at this time, with insight into her progress and awareness of how to re-engage in services if needed.  P: Plan / Intervention  LCSW expressed empathy and provided emotional support.  Utilized psychoanalytic therapy techniques to promote expression of thoughts, feelings, and emotions in a nonjudgmental environment.  Applied supportive therapy to reinforce progress, provide praise, and encourage continued self-care.  Validated patient's emotions and therapeutic progress.  Implemented person-centered therapy techniques to promote empowerment and self-efficacy.  Provided patient with discharge information, [facility] office contact details, and follow-up instructions regarding psychiatric medication management transition.  Patient discharged from therapy services with the option to return as needed.      Plan: Return again in 4 weeks.  Diagnosis: PTSD (post-traumatic stress  disorder)  GAD (generalized anxiety disorder)  MDD (major depressive disorder), recurrent, in full remission  Collaboration of Care: Other None   Patient/Guardian was advised Release of Information must be obtained prior to any record release in order to collaborate their care with an outside provider. Patient/Guardian was advised if they have not already done so to contact the registration department to sign all necessary forms in order for us  to release information regarding their care.   Consent: Patient/Guardian gives verbal consent for treatment and assignment of benefits for services provided during this visit. Patient/Guardian expressed understanding and agreed to proceed.   Juliene GORMAN Patee, LCSW 04/14/2024

## 2024-04-14 NOTE — Progress Notes (Signed)
 Patient says that she has had aching and soreness in both hands. She is having difficulty opening and closing the hands, especially the left index finger. She is taking Meloxicam  and Gabapentin , which does alleviate her symptoms somewhat. She runs her arms and hands under warm water in the mornings, which is very helpful. She is inquiring about injections today, as previously discussed.

## 2024-04-17 LAB — CBC WITH DIFFERENTIAL/PLATELET
Absolute Lymphocytes: 2647 {cells}/uL (ref 850–3900)
Absolute Monocytes: 403 {cells}/uL (ref 200–950)
Basophils Absolute: 40 {cells}/uL (ref 0–200)
Basophils Relative: 0.6 %
Eosinophils Absolute: 79 {cells}/uL (ref 15–500)
Eosinophils Relative: 1.2 %
HCT: 41.6 % (ref 35.0–45.0)
Hemoglobin: 13.2 g/dL (ref 11.7–15.5)
MCH: 27.9 pg (ref 27.0–33.0)
MCHC: 31.7 g/dL — ABNORMAL LOW (ref 32.0–36.0)
MCV: 87.9 fL (ref 80.0–100.0)
MPV: 11.6 fL (ref 7.5–12.5)
Monocytes Relative: 6.1 %
Neutro Abs: 3432 {cells}/uL (ref 1500–7800)
Neutrophils Relative %: 52 %
Platelets: 306 Thousand/uL (ref 140–400)
RBC: 4.73 Million/uL (ref 3.80–5.10)
RDW: 13 % (ref 11.0–15.0)
Total Lymphocyte: 40.1 %
WBC: 6.6 Thousand/uL (ref 3.8–10.8)

## 2024-04-17 LAB — CYCLIC CITRUL PEPTIDE ANTIBODY, IGG: Cyclic Citrullin Peptide Ab: <16 U

## 2024-04-17 LAB — C-REACTIVE PROTEIN: CRP: <3 mg/L (ref <8.0)

## 2024-04-17 LAB — VITAMIN D 25 HYDROXY (VIT D DEFICIENCY, FRACTURES): Vit D, 25-Hydroxy: 28 ng/mL — ABNORMAL LOW (ref 30–100)

## 2024-04-17 LAB — ANA: Anti Nuclear Antibody (ANA): POSITIVE — AB

## 2024-04-17 LAB — ANTI-NUCLEAR AB-TITER (ANA TITER): ANA Titer 1: 1:320 {titer} — ABNORMAL HIGH

## 2024-04-17 LAB — RHEUMATOID FACTOR: Rheumatoid fact SerPl-aCnc: <10 [IU]/mL (ref <14)

## 2024-04-17 LAB — SEDIMENTATION RATE

## 2024-04-18 ENCOUNTER — Ambulatory Visit: Payer: Self-pay | Admitting: Sports Medicine
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
## 2024-04-21 ENCOUNTER — Encounter (HOSPITAL_COMMUNITY): Payer: Self-pay | Admitting: Psychiatry

## 2024-04-21 ENCOUNTER — Other Ambulatory Visit: Payer: Self-pay | Admitting: Sports Medicine

## 2024-04-21 ENCOUNTER — Encounter (HOSPITAL_COMMUNITY): Payer: Self-pay

## 2024-04-21 ENCOUNTER — Telehealth (INDEPENDENT_AMBULATORY_CARE_PROVIDER_SITE_OTHER): Admitting: Psychiatry

## 2024-04-21 DIAGNOSIS — F1290 Cannabis use, unspecified, uncomplicated: Secondary | ICD-10-CM | POA: Diagnosis not present

## 2024-04-21 DIAGNOSIS — M25642 Stiffness of left hand, not elsewhere classified: Secondary | ICD-10-CM

## 2024-04-21 DIAGNOSIS — F411 Generalized anxiety disorder: Secondary | ICD-10-CM | POA: Diagnosis not present

## 2024-04-21 DIAGNOSIS — F3342 Major depressive disorder, recurrent, in full remission: Secondary | ICD-10-CM | POA: Diagnosis not present

## 2024-04-21 DIAGNOSIS — F4310 Post-traumatic stress disorder, unspecified: Secondary | ICD-10-CM | POA: Diagnosis not present

## 2024-04-21 DIAGNOSIS — M25641 Stiffness of right hand, not elsewhere classified: Secondary | ICD-10-CM

## 2024-04-21 DIAGNOSIS — E559 Vitamin D deficiency, unspecified: Secondary | ICD-10-CM

## 2024-04-21 DIAGNOSIS — M79641 Pain in right hand: Secondary | ICD-10-CM

## 2024-04-21 MED ORDER — HYDROXYZINE HCL 25 MG PO TABS: 25.0000 mg | ORAL_TABLET | Freq: Every day | ORAL | 2 refills | Status: AC | PRN

## 2024-04-21 MED ORDER — PRAZOSIN HCL 1 MG PO CAPS: 1.0000 mg | ORAL_CAPSULE | Freq: Every day | ORAL | 0 refills | Status: DC

## 2024-04-21 MED ORDER — BUPROPION HCL ER (XL) 300 MG PO TB24: 300.0000 mg | ORAL_TABLET | Freq: Every day | ORAL | 2 refills | Status: DC

## 2024-04-21 NOTE — Patient Instructions (Signed)
 Thank you for attending your appointment today.  -- INCREASE Wellbutrin  to 300 mg daily once you meet with your primary care doctor about your blood pressure and discuss management options -- Continue other medications as prescribed.  Please do not make any changes to medications without first discussing with your provider. If you are experiencing a psychiatric emergency, please call 911 or present to your nearest emergency department. Additional crisis, medication management, and therapy resources are included below.  [facility]  [address] [place], [place] [address] [phone] WALK-IN URGENT CARE 24/7 FOR ANYONE [address], [place], [place]  [phone] Fax: [phone] [email or web address] *Interpreters available *Accepts all insurance and uninsured for Urgent Care needs *Accepts Medicaid and uninsured for outpatient treatment (below)      ONLY FOR [facility]  Below:    Outpatient New Patient Assessment/Therapy Walk-ins:        Monday, Wednesday, and Thursday 8am until slots are full (first come, first served)                   New Patient Psychiatry/Medication Management        Monday-Friday 8am-11am (first come, first served)               For all walk-ins we ask that you arrive by 7:15am, because patients will be seen in the order of arrival.

## 2024-04-28 ENCOUNTER — Ambulatory Visit: Attending: Family Medicine | Admitting: Internal Medicine

## 2024-04-28 ENCOUNTER — Encounter: Payer: Self-pay | Admitting: Internal Medicine

## 2024-04-28 VITALS — BP 150/91 | HR 78 | Ht 62.0 in | Wt 174.0 lb

## 2024-04-28 DIAGNOSIS — E669 Obesity, unspecified: Secondary | ICD-10-CM | POA: Diagnosis not present

## 2024-04-28 DIAGNOSIS — K219 Gastro-esophageal reflux disease without esophagitis: Secondary | ICD-10-CM | POA: Diagnosis not present

## 2024-04-28 DIAGNOSIS — H9313 Tinnitus, bilateral: Secondary | ICD-10-CM | POA: Diagnosis not present

## 2024-04-28 DIAGNOSIS — H9193 Unspecified hearing loss, bilateral: Secondary | ICD-10-CM

## 2024-04-28 DIAGNOSIS — Z6831 Body mass index (BMI) 31.0-31.9, adult: Secondary | ICD-10-CM | POA: Diagnosis not present

## 2024-04-28 DIAGNOSIS — E66811 Obesity, class 1: Secondary | ICD-10-CM

## 2024-04-28 DIAGNOSIS — R140 Abdominal distension (gaseous): Secondary | ICD-10-CM

## 2024-04-28 DIAGNOSIS — R1013 Epigastric pain: Secondary | ICD-10-CM

## 2024-04-28 MED ORDER — OMEPRAZOLE 40 MG PO CPDR: 40.0000 mg | DELAYED_RELEASE_CAPSULE | Freq: Two times a day (BID) | ORAL | 1 refills | Status: AC

## 2024-04-28 NOTE — Progress Notes (Signed)
 Patient ID: Crystal Koch, female    DOB: Feb 04, 1981  MRN: [id number]  CC: Bloated (Abdominal blaoting / pain, excessive gas even with healthy eating - requesting weight loss options./Constant tinnitus, hard of hearing /Fatigue with Cpap/No to flu vax)   Subjective: Crystal Koch is a 43 y.o. female who presents for UC visit. PCP is Dr. Delbert Her concerns today include:  history of lumbar radiculopathy, GAD, MDD, nicotine dependence   Discussed the use of AI scribe software for clinical note transcription with the patient, who gave verbal consent to proceed.  History of Present Illness Crystal Koch is a 43 year old female who presents with abdominal pain and bloating.  She experiences increased bloating and burning sensations in her stomach whenever she consumes liquid or solid foods. The pain occurs after every meal, regardless of the type of food, including tea, bread, and seafood. No diarrhea but stools are soft and has excessive gas. She experiences bloating and gas with certain foods, such as green leafy vegetables and chickpeas. Loves spicy foods. This issue has been ongoing for a year or two, previously associated with an overgrowth of stomach bacteria (? H.pylori). She currently takes omeprazole  daily, but it does not alleviate her symptoms, particularly by the end of the day. Would like to increase to twice a day dosing.   Wanting to lose wgh. She has changed her diet since March, avoiding dairy, red meat, and incorporating more vegan and alkaline options. Despite these changes, her blood pressure continues to rise.  She has also increased her physical activity, walking her dog multiple times a day, jogging, and using a vibration plate, but has not seen desired weight loss results. She is concerned about her hyperlipidemia and high cholesterol, noting that despite her efforts, she has gained weight.  She also reports constant ringing in her ears and decreased hearing, which has been  ongoing for several years. She acknowledges that she often speaks loudly and has difficulty hearing others, which has become more noticeable recently.    Patient Active Problem List   Diagnosis Date Noted   Obstructive sleep apnea 01/04/2024   Hyperlipidemia 01/02/2024   Prediabetes 01/02/2024   PTSD (post-traumatic stress disorder) 09/03/2023   GAD (generalized anxiety disorder) 09/03/2023   MDD (major depressive disorder), recurrent, in full remission 09/03/2023   Use of cannabis 09/03/2023   Anxiety 05/16/2023   Positive depression screening 04/18/2023   Lumbar radiculopathy 04/18/2023   Acute non-recurrent sinusitis 05/31/2021   Vitamin D  deficiency 06/22/2014   Sciatica of left side 06/19/2014   Current smoker 06/19/2014     Current Outpatient Medications on File Prior to Visit  Medication Sig Dispense Refill   atorvastatin  (LIPITOR) 40 MG tablet Take 1 tablet (40 mg total) by mouth daily. 90 tablet 1   buPROPion  (WELLBUTRIN  XL) 300 MG 24 hr tablet Take 1 tablet (300 mg total) by mouth daily. 30 tablet 2   chlorhexidine  (PERIDEX ) 0.12 % solution Use as directed 15 mLs in the mouth or throat 2 (two) times daily as needed. Swish and spit twice daily as needed 120 mL 0   Cholecalciferol  (VITAMIN D3) 10 MCG (400 UNIT) tablet Take 1 tablet (400 Units total) by mouth daily. 100 tablet 1   cyclobenzaprine  (FLEXERIL ) 5 MG tablet Take 1 tablet (5 mg total) by mouth 3 (three) times daily as needed for muscle spasms. 30 tablet 2   fluticasone  (FLONASE ) 50 MCG/ACT nasal spray Place 2 sprays into both nostrils daily. 16 g 6  gabapentin  (NEURONTIN ) 100 MG capsule Take 1 capsule (100 mg total) by mouth 2 (two) times daily. 60 capsule 3   hydrOXYzine  (ATARAX ) 25 MG tablet Take 1-2 tablets (25-50 mg total) by mouth daily as needed for anxiety. 60 tablet 2   meloxicam  (MOBIC ) 15 MG tablet Take 15 mg by mouth daily.     Misc. Devices MISC Auto PAP 5 to 15 cm of water.  Diagnosis-obstructive sleep  apnea 1 each 0   prazosin  (MINIPRESS ) 1 MG capsule Take 1 capsule (1 mg total) by mouth at bedtime. 90 capsule 0   No current facility-administered medications on file prior to visit.    No Known Allergies  Social History   Socioeconomic History   Marital status: Single    Spouse name: Not on file   Number of children: 1   Years of education: college    Highest education level: Associate degree: occupational, scientist, product/process development, or vocational program  Occupational History   Occupation: Homemaker   Tobacco Use   Smoking status: Former    Current packs/day: 0.00    Average packs/day: 0.3 packs/day for 16.0 years (4.0 ttl pk-yrs)    Types: Cigarettes    Start date: 10/03/2023    Quit date: 10/22/2023    Years since quitting: 0.5   Smokeless tobacco: Never  Vaping Use   Vaping status: Never Used  Substance and Sexual Activity   Alcohol use: No   Drug use: Yes    Types: Marijuana    Comment: prior microdosing of mushrooms twice weekly stopped as of 09/11/23. Marijuana: current use of CBD/THC   Sexual activity: Yes    Partners: Male    Birth control/protection: None  Other Topics Concern   Not on file  Social History Narrative   Lives at home with 4 yo son.    Social Drivers of Corporate Investment Banker Strain: High Risk (04/28/2024)   Overall Financial Resource Strain (CARDIA)    Difficulty of Paying Living Expenses: Hard  Food Insecurity: No Food Insecurity (04/28/2024)   Hunger Vital Sign    Worried About Running Out of Food in the Last Year: Never true    Ran Out of Food in the Last Year: Never true  Recent Concern: Food Insecurity - Food Insecurity Present (04/27/2024)   Hunger Vital Sign    Worried About Running Out of Food in the Last Year: Often true    Ran Out of Food in the Last Year: Sometimes true  Transportation Needs: No Transportation Needs (04/28/2024)   PRAPARE - Administrator, Civil Service (Medical): No    Lack of Transportation (Non-Medical):  No  Physical Activity: Sufficiently Active (04/28/2024)   Exercise Vital Sign    Days of Exercise per Week: 5 days    Minutes of Exercise per Session: 120 min  Stress: No Stress Concern Present (04/28/2024)   [employer] of Occupational Health - Occupational Stress Questionnaire    Feeling of Stress: Only a little  Social Connections: Moderately Isolated (04/28/2024)   Social Connection and Isolation Panel    Frequency of Communication with Friends and Family: More than three times a week    Frequency of Social Gatherings with Friends and Family: More than three times a week    Attends Religious Services: Never    Database Administrator or Organizations: Yes    Attends Engineer, Structural: More than 4 times per year    Marital Status: Separated  Intimate Partner Violence: Not  At Risk (04/28/2024)   Humiliation, Afraid, Rape, and Kick questionnaire    Fear of Current or Ex-Partner: No    Emotionally Abused: No    Physically Abused: No    Sexually Abused: No    Family History  Problem Relation Age of Onset   Thyroid disease Mother    Mental illness Mother    Cancer Father    Hypertension Maternal Uncle    Cancer Maternal Grandmother    Diabetes Neg Hx     Past Surgical History:  Procedure Laterality Date   CESAREAN SECTION  2011    ROS: Review of Systems Negative except as stated above  PHYSICAL EXAM: BP (!) 150/91 (BP Location: Left Arm, Patient Position: Sitting, Cuff Size: Normal)   Pulse 78   Ht 5' 2 (1.575 m)   Wt 174 lb (78.9 kg)   SpO2 99%   BMI 31.83 kg/m   Wt Readings from Last 3 Encounters:  04/28/24 174 lb (78.9 kg)  03/31/24 169 lb (76.7 kg)  01/02/24 171 lb (77.6 kg)    Physical Exam  General appearance - alert, well appearing, middle age AAF and in no distress Mental status - normal mood, behavior, speech, dress, motor activity, and thought processes Ears - bilateral TM's and external ear canals normal Abdomen - soft,  nontender, nondistended, no masses or organomegaly      Latest Ref Rng & Units 06/25/2023   12:10 PM 12/21/2022   11:29 AM 03/02/2022   10:15 AM  CMP  Glucose 70 - 99 mg/dL 78  75  75   BUN 6 - 24 mg/dL 11  11  14    Creatinine 0.57 - 1.00 mg/dL 9.33  9.32  9.25   Sodium 134 - 144 mmol/L 138  138  141   Potassium 3.5 - 5.2 mmol/L 4.4  4.2  4.0   Chloride 96 - 106 mmol/L 100  104  104   CO2 20 - 29 mmol/L 25  22  23    Calcium  8.7 - 10.2 mg/dL 9.4  9.2  9.0   Total Protein 6.0 - 8.5 g/dL 7.2  6.5    Total Bilirubin 0.0 - 1.2 mg/dL 0.2  0.3    Alkaline Phos 44 - 121 IU/L 79  65    AST 0 - 40 IU/L 19  11    ALT 0 - 32 IU/L 22  11     Lipid Panel     Component Value Date/Time   CHOL 211 (H) 01/02/2024 0930   TRIG 51 01/02/2024 0930   HDL 47 01/02/2024 0930   CHOLHDL 5.0 (H) 03/24/2021 1059   LDLCALC 155 (H) 01/02/2024 0930    CBC    Component Value Date/Time   WBC 6.6 04/14/2024 1326   RBC 4.73 04/14/2024 1326   HGB 13.2 04/14/2024 1326   HGB 15.3 06/25/2023 1210   HCT 41.6 04/14/2024 1326   HCT 47.2 (H) 06/25/2023 1210   PLT 306 04/14/2024 1326   PLT 364 06/25/2023 1210   MCV 87.9 04/14/2024 1326   MCV 88 06/25/2023 1210   MCH 27.9 04/14/2024 1326   MCHC 31.7 (L) 04/14/2024 1326   RDW 13.0 04/14/2024 1326   RDW 12.6 06/25/2023 1210   LYMPHSABS 3.0 06/25/2023 1210   EOSABS 79 04/14/2024 1326   EOSABS 0.1 06/25/2023 1210   BASOSABS 40 04/14/2024 1326   BASOSABS 0.0 06/25/2023 1210    ASSESSMENT AND PLAN: 1. Dyspepsia (Primary) 2. Gastroesophageal reflux disease without esophagitis 3. Abdominal  bloating Chronic epigastric pain with bloating and burning postprandially. Differential includes H. pylori infection and small bowel bacterial overgrowth. Has GERD. - Increased omeprazole  to 20 mg twice daily. - Ordered H. pylori breath test. - Advised dietary modifications: avoid spicy foods, tomato-based products, and acidic juices. - Recommended eating last meal 2-3  hours before lying down and sleeping with head elevated. - Suggested probiotics twice a week. - H. pylori breath test - omeprazole  (PRILOSEC) 40 MG capsule; Take 1 capsule (40 mg total) by mouth 2 (two) times daily.  Dispense: 180 capsule; Refill: 1 - H. pylori breath test  4. Tinnitus of both ears 5. Decreased hearing of both ears - Ambulatory referral to ENT  6. Obesity (BMI 30.0-34.9) Commended her on changes that she has made in her eating habits.  Encouraged her to continue to eat healthy and more plant-based foods.  Continue regular exercise.  She does not meet Medicaid criteria for Wegovy or Zepbound.  However I told her about our medical weight management program and she is interested in pursuing this.  Referral submitted. - Amb Ref to Medical Weight Management    Patient was given the opportunity to ask questions.  Patient verbalized understanding of the plan and was able to repeat key elements of the plan.   This documentation was completed using Paediatric nurse.  Any transcriptional errors are unintentional.  Orders Placed This Encounter  Procedures   H. pylori breath test   Amb Ref to Medical Weight Management   Ambulatory referral to ENT     Requested Prescriptions   Signed Prescriptions Disp Refills   omeprazole  (PRILOSEC) 40 MG capsule 180 capsule 1    Sig: Take 1 capsule (40 mg total) by mouth 2 (two) times daily.    Return if symptoms worsen or fail to improve.  Barnie Louder, MD, FACP

## 2024-04-28 NOTE — Patient Instructions (Signed)
  VISIT SUMMARY: Today, we discussed your ongoing abdominal pain and bloating, difficulty with weight loss, and hearing issues. We reviewed your symptoms, current medications, and lifestyle changes. We have made some adjustments to your treatment plan and provided referrals for further evaluation.  YOUR PLAN: -EPIGASTRIC PAIN WITH BLOATING AND BURNING AFTER MEALS: This condition involves pain and discomfort in the upper abdomen, often accompanied by bloating and a burning sensation after eating. We have increased your omeprazole  dosage to 20 mg twice daily and ordered an H. pylori breath test to check for bacterial infection. You should avoid spicy foods, tomato-based products, and acidic juices. Try to eat your last meal 2-3 hours before lying down and sleep with your head elevated. Additionally, take probiotics twice a week.  -OVERWEIGHT WITH DIFFICULTY LOSING WEIGHT: Being overweight means having a body mass index (BMI) of 31, which can affect your overall health. Despite your efforts, you have not seen the desired weight loss. We have referred you to [facility] medical weight management program for further assistance.  -BILATERAL TINNITUS AND HEARING LOSS: Tinnitus is a ringing or buzzing noise in one or both ears that may be constant or come and go, often accompanied by hearing loss. We have referred you to an ear, nose, and throat specialist for further evaluation and a hearing test.  INSTRUCTIONS: Please follow up with the H. pylori breath test as ordered. Schedule an appointment with [facility] medical weight management program. Also, make an appointment with the ear, nose, and throat specialist for your hearing issues.                      Contains text generated by Abridge.                                 Contains text generated by Abridge.

## 2024-04-30 ENCOUNTER — Encounter (INDEPENDENT_AMBULATORY_CARE_PROVIDER_SITE_OTHER): Payer: Self-pay

## 2024-05-01 ENCOUNTER — Ambulatory Visit: Payer: Self-pay | Admitting: Internal Medicine

## 2024-05-01 LAB — H. PYLORI BREATH COLLECTION

## 2024-05-01 LAB — H. PYLORI BREATH TEST: H pylori Breath Test: NEGATIVE

## 2024-05-05 ENCOUNTER — Ambulatory Visit (INDEPENDENT_AMBULATORY_CARE_PROVIDER_SITE_OTHER): Admitting: Licensed Clinical Social Worker

## 2024-05-05 DIAGNOSIS — F4310 Post-traumatic stress disorder, unspecified: Secondary | ICD-10-CM | POA: Diagnosis not present

## 2024-05-05 DIAGNOSIS — F3342 Major depressive disorder, recurrent, in full remission: Secondary | ICD-10-CM | POA: Diagnosis not present

## 2024-05-05 DIAGNOSIS — F411 Generalized anxiety disorder: Secondary | ICD-10-CM

## 2024-05-05 NOTE — Progress Notes (Addendum)
 THERAPIST PROGRESS NOTE  Virtual Visit via Video Note  I connected with [name] on 05/05/24 at 11:00 AM EST by a video enabled telemedicine application and verified that I am speaking with the correct person using two identifiers.  Location: Patient: [facility]  Provider: Providers Home    I discussed the limitations of evaluation and management by telemedicine and the availability of in person appointments. The patient expressed understanding and agreed to proceed.     I discussed the assessment and treatment plan with the patient. The patient was provided an opportunity to ask questions and all were answered. The patient agreed with the plan and demonstrated an understanding of the instructions.   The patient was advised to call back or seek an in-person evaluation if the symptoms worsen or if the condition fails to improve as anticipated.  I provided 30 minutes of non-face-to-face time during this encounter.   Juliene GORMAN Patee, LCSW   Participation Level: Active  Behavioral Response: CasualAlertAnxious  Type of Therapy: Individual Therapy  Treatment Goals addressed:  Resolved     Anxiety     LTG: Goddess will score less than 5 on the Generalized Anxiety Disorder 7 Scale (GAD-7)  (Completed/Met)     Start:  10/09/23    Expected End:  04/11/24    Resolved:  03/24/24      STG: Clayborne will participate in at least 80% of scheduled individual psychotherapy sessions  (Completed/Met)     Start:  10/09/23    Expected End:  04/11/24    Resolved:  04/14/24      STG: Clayborne will complete at least 80% of assigned homework  (Completed/Met)     Start:  10/09/23    Expected End:  04/11/24    Resolved:  04/14/24      STG: Clayborne will practice problem solving skills 3 times per week for the next 4 weeks.  (Completed/Met)     Start:  10/09/23    Expected End:  04/11/24    Resolved:  04/14/24      STG: Report a decrease in anxiety symptoms as evidenced by an overall  reduction in anxiety score by a minimum of 25% on the Generalized Anxiety Disorder Scale (GAD-7) (Completed/Met)     Start:  10/09/23    Expected End:  04/11/24    Resolved:  03/24/24      Discuss risks and benefits of medication treatment options for this problem and prescribe as indicated (Completed)     Start:  10/09/23    End:  11/12/23      Encourage Clayborne to take psychotropic medication(s) as prescribed (Completed)     Start:  10/09/23    End:  11/12/23      Review results of GAD-7 with Clayborne to track progress (Completed)     Start:  10/09/23    End:  05/05/24      Work with Clayborne to track symptoms, triggers, and/or skill use through a mood chart, diary card, or journal (Completed)     Start:  10/09/23    End:  11/12/23      Perform psychoeducation regarding anxiety disorders (Completed)     Start:  10/09/23    End:  12/03/23      Provide Clayborne with educational information and reading material on anxiety, its causes, and symptoms.  (Completed)     Start:  10/09/23    End:  12/03/23         ProgressTowards Goals: Progressing  Interventions: CBT and Motivational Interviewing   Suicidal/Homicidal: Nowithout intent/plan  Therapist Response:     S - Subjective: Jaylanie reports that everything has been going well. She notes improvements both at work and at home and states that she is managing stressors more effectively. She attributes this in part to an increase in her anxiety medication, which she reports has reduced her anxiety symptoms. Anjalina reports a decrease in depressive symptoms overall. Recent assessment scores indicate progress, with both the GAD-7 and PHQ-9 scoring below 5, demonstrating minimal symptoms. She expresses comfort with her progress and readiness for discharge from therapy.  O - Objective: Shonda was alert and oriented 5. She was pleasant, cooperative, and maintained good eye contact. She was casually dressed and engaged well throughout the  therapy session. Mood was euthymic with appropriate affect. No concerns observed regarding thought process, behavior, or safety. GAD-7 and PHQ-9 scores fall below clinical thresholds, indicating significant improvement.  A - Assessment: Mosetta has demonstrated substantial improvement in both anxiety and depressive symptoms. Treatment goals related to anxiety (GAD-7) and depression (PHQ-9) have been met. She shows increased coping ability, improved functioning at work and home, and stability in mood. No current risk concerns (SI/HI) reported or observed. Given symptom reduction and progress toward goals, patient is appropriate for discharge from therapy at this time.  P - Plan:  LCSW and patient agreed to discharge from therapy due to goal completion and symptom improvement.  LCSW discussed the clinicians transfer to the [facility] psychiatric office.  Provided [place] with resources for reassessment should new symptoms or stressors arise, including:   - [facility]   - [facility]  [place] will continue medication management through [facility].  She is scheduled to see a new psychiatrist in December 2025, as her previous psychiatrist has left the facility.    Diagnosis: PTSD (post-traumatic stress disorder)  GAD (generalized anxiety disorder)  MDD (major depressive disorder), recurrent, in full remission  Collaboration of Care: Other Continued care for medication mgnt [facility]    Patient/Guardian was advised Release of Information must be obtained prior to any record release in order to collaborate their care with an outside provider. Patient/Guardian was advised if they have not already done so to contact the registration department to sign all necessary forms in order for us  to release information regarding their care.   Consent: Patient/Guardian gives verbal consent for  treatment and assignment of benefits for services provided during this visit. Patient/Guardian expressed understanding and agreed to proceed.   Juliene GORMAN Patee, LCSW 05/05/2024

## 2024-05-09 ENCOUNTER — Encounter (INDEPENDENT_AMBULATORY_CARE_PROVIDER_SITE_OTHER): Payer: Self-pay

## 2024-05-20 ENCOUNTER — Ambulatory Visit: Admitting: Internal Medicine

## 2024-05-21 ENCOUNTER — Telehealth

## 2024-05-21 DIAGNOSIS — H60392 Other infective otitis externa, left ear: Secondary | ICD-10-CM

## 2024-05-21 MED ORDER — CIPROFLOXACIN-DEXAMETHASONE 0.3-0.1 % OT SUSP: 4.0000 [drp] | Freq: Two times a day (BID) | OTIC | 0 refills | Status: AC

## 2024-05-21 NOTE — Progress Notes (Signed)
 Virtual Visit Consent   [name], you are scheduled for a virtual visit with a [facility] provider today. Just as with appointments in the office, your consent must be obtained to participate. Your consent will be active for this visit and any virtual visit you may have with one of our providers in the next 365 days. If you have a MyChart account, a copy of this consent can be sent to you electronically.  As this is a virtual visit, video technology does not allow for your provider to perform a traditional examination. This may limit your provider's ability to fully assess your condition. If your provider identifies any concerns that need to be evaluated in person or the need to arrange testing (such as labs, EKG, etc.), we will make arrangements to do so. Although advances in technology are sophisticated, we cannot ensure that it will always work on either your end or our end. If the connection with a video visit is poor, the visit may have to be switched to a telephone visit. With either a video or telephone visit, we are not always able to ensure that we have a secure connection.  By engaging in this virtual visit, you consent to the provision of healthcare and authorize for your insurance to be billed (if applicable) for the services provided during this visit. Depending on your insurance coverage, you may receive a charge related to this service.  I need to obtain your verbal consent now. Are you willing to proceed with your visit today? [name] has provided verbal consent on 05/21/2024 for a virtual visit (video or telephone). [name], PA-C  Date: 05/21/2024 8:51 AM   Virtual Visit via Video Note   I, [name], connected with  Crystal Koch  ([id number], [birth year]) on 05/21/24 at  8:45 AM EST by a video-enabled telemedicine application and verified that I am speaking with the correct person using two identifiers.  Location: Patient: Virtual Visit Location  Patient: Home Provider: Virtual Visit Location Provider: Home Office   I discussed the limitations of evaluation and management by telemedicine and the availability of in person appointments. The patient expressed understanding and agreed to proceed.    History of Present Illness: Crystal Koch is a 43 y.o. who identifies as a female who was assigned female at birth, and is being seen today for left ear pain.  HPI: Otalgia  There is pain in the left ear. This is a new problem. The current episode started yesterday. The problem occurs constantly. The problem has been unchanged. There has been no fever. The pain is mild. Associated symptoms include headaches and hearing loss (has been ongoing). Pertinent negatives include no coughing, ear discharge, rhinorrhea or sore throat. Associated symptoms comments: Mild nausea. She has tried NSAIDs (meloxicam ) for the symptoms. The treatment provided mild relief.    Problems:  Patient Active Problem List   Diagnosis Date Noted   Obstructive sleep apnea 01/04/2024   Hyperlipidemia 01/02/2024   Prediabetes 01/02/2024   PTSD (post-traumatic stress disorder) 09/03/2023   GAD (generalized anxiety disorder) 09/03/2023   MDD (major depressive disorder), recurrent, in full remission 09/03/2023   Use of cannabis 09/03/2023   Anxiety 05/16/2023   Positive depression screening 04/18/2023   Lumbar radiculopathy 04/18/2023   Acute non-recurrent sinusitis 05/31/2021   Vitamin D  deficiency 06/22/2014   Sciatica of left side 06/19/2014   Current smoker 06/19/2014    Allergies: No Known Allergies Medications:  Current Outpatient Medications:    ciprofloxacin-dexamethasone (  CIPRODEX) OTIC suspension, Place 4 drops into the left ear 2 (two) times daily for 7 days., Disp: 7.5 mL, Rfl: 0   atorvastatin  (LIPITOR) 40 MG tablet, Take 1 tablet (40 mg total) by mouth daily., Disp: 90 tablet, Rfl: 1   buPROPion  (WELLBUTRIN  XL) 300 MG 24 hr tablet, Take 1 tablet (300 mg  total) by mouth daily., Disp: 30 tablet, Rfl: 2   chlorhexidine  (PERIDEX ) 0.12 % solution, Use as directed 15 mLs in the mouth or throat 2 (two) times daily as needed. Swish and spit twice daily as needed, Disp: 120 mL, Rfl: 0   Cholecalciferol  (VITAMIN D3) 10 MCG (400 UNIT) tablet, Take 1 tablet (400 Units total) by mouth daily., Disp: 100 tablet, Rfl: 1   cyclobenzaprine  (FLEXERIL ) 5 MG tablet, Take 1 tablet (5 mg total) by mouth 3 (three) times daily as needed for muscle spasms., Disp: 30 tablet, Rfl: 2   fluticasone  (FLONASE ) 50 MCG/ACT nasal spray, Place 2 sprays into both nostrils daily., Disp: 16 g, Rfl: 6   gabapentin  (NEURONTIN ) 100 MG capsule, Take 1 capsule (100 mg total) by mouth 2 (two) times daily., Disp: 60 capsule, Rfl: 3   hydrOXYzine  (ATARAX ) 25 MG tablet, Take 1-2 tablets (25-50 mg total) by mouth daily as needed for anxiety., Disp: 60 tablet, Rfl: 2   meloxicam  (MOBIC ) 15 MG tablet, Take 15 mg by mouth daily., Disp: , Rfl:    Misc. Devices MISC, Auto PAP 5 to 15 cm of water.  Diagnosis-obstructive sleep apnea, Disp: 1 each, Rfl: 0   omeprazole  (PRILOSEC) 40 MG capsule, Take 1 capsule (40 mg total) by mouth 2 (two) times daily., Disp: 180 capsule, Rfl: 1   prazosin  (MINIPRESS ) 1 MG capsule, Take 1 capsule (1 mg total) by mouth at bedtime., Disp: 90 capsule, Rfl: 0  Observations/Objective: Patient is well-developed, well-nourished in no acute distress.  Resting comfortably at home.  Head is normocephalic, atraumatic.  No labored breathing.  Speech is clear and coherent with logical content.  Patient is alert and oriented at baseline.    Assessment and Plan: 1. Other infective acute otitis externa of left ear (Primary) - ciprofloxacin-dexamethasone (CIPRODEX) OTIC suspension; Place 4 drops into the left ear 2 (two) times daily for 7 days.  Dispense: 7.5 mL; Refill: 0  - Worsening symptoms that have not responded to OTC medications.  - Will give Ciprodex ear drops - Could  consider to add Flonase  (Fluticasone ) nasal spray over the counter for possible eustachian tube dysfunction - Steam and humidifier can help - Warm compress to ear - Stay well hydrated and get plenty of rest.  - Seek in person evaluation if no symptom improvement or if symptoms worsen   Follow Up Instructions: I discussed the assessment and treatment plan with the patient. The patient was provided an opportunity to ask questions and all were answered. The patient agreed with the plan and demonstrated an understanding of the instructions.  A copy of instructions were sent to the patient via MyChart unless otherwise noted below.    The patient was advised to call back or seek an in-person evaluation if the symptoms worsen or if the condition fails to improve as anticipated.    [name], PA-C

## 2024-05-21 NOTE — Patient Instructions (Signed)
 [name], thank you for joining [name], PA-C for today's virtual visit.  While this provider is not your primary care provider (PCP), if your PCP is located in our provider database this encounter information will be shared with them immediately following your visit.   A [facility] MyChart account gives you access to today's visit and all your visits, tests, and labs performed at [facility]  click here if you don't have a [facility] MyChart account or go to [email or web address]  Consent: (Patient) Crystal Koch provided verbal consent for this virtual visit at the beginning of the encounter.  Current Medications:  Current Outpatient Medications:    ciprofloxacin-dexamethasone (CIPRODEX) OTIC suspension, Place 4 drops into the left ear 2 (two) times daily for 7 days., Disp: 7.5 mL, Rfl: 0   atorvastatin  (LIPITOR) 40 MG tablet, Take 1 tablet (40 mg total) by mouth daily., Disp: 90 tablet, Rfl: 1   buPROPion  (WELLBUTRIN  XL) 300 MG 24 hr tablet, Take 1 tablet (300 mg total) by mouth daily., Disp: 30 tablet, Rfl: 2   chlorhexidine  (PERIDEX ) 0.12 % solution, Use as directed 15 mLs in the mouth or throat 2 (two) times daily as needed. Swish and spit twice daily as needed, Disp: 120 mL, Rfl: 0   Cholecalciferol  (VITAMIN D3) 10 MCG (400 UNIT) tablet, Take 1 tablet (400 Units total) by mouth daily., Disp: 100 tablet, Rfl: 1   cyclobenzaprine  (FLEXERIL ) 5 MG tablet, Take 1 tablet (5 mg total) by mouth 3 (three) times daily as needed for muscle spasms., Disp: 30 tablet, Rfl: 2   fluticasone  (FLONASE ) 50 MCG/ACT nasal spray, Place 2 sprays into both nostrils daily., Disp: 16 g, Rfl: 6   gabapentin  (NEURONTIN ) 100 MG capsule, Take 1 capsule (100 mg total) by mouth 2 (two) times daily., Disp: 60 capsule, Rfl: 3   hydrOXYzine  (ATARAX ) 25 MG tablet, Take 1-2 tablets (25-50 mg total) by mouth daily as needed for anxiety., Disp: 60 tablet, Rfl: 2   meloxicam  (MOBIC ) 15 MG  tablet, Take 15 mg by mouth daily., Disp: , Rfl:    Misc. Devices MISC, Auto PAP 5 to 15 cm of water.  Diagnosis-obstructive sleep apnea, Disp: 1 each, Rfl: 0   omeprazole  (PRILOSEC) 40 MG capsule, Take 1 capsule (40 mg total) by mouth 2 (two) times daily., Disp: 180 capsule, Rfl: 1   prazosin  (MINIPRESS ) 1 MG capsule, Take 1 capsule (1 mg total) by mouth at bedtime., Disp: 90 capsule, Rfl: 0   Medications ordered in this encounter:  Meds ordered this encounter  Medications   ciprofloxacin-dexamethasone (CIPRODEX) OTIC suspension    Sig: Place 4 drops into the left ear 2 (two) times daily for 7 days.    Dispense:  7.5 mL    Refill:  0    Supervising Provider:   BLAISE ALEENE KIDD [[id number]]     *If you need refills on other medications prior to your next appointment, please contact your pharmacy*  Follow-Up: Call back or seek an in-person evaluation if the symptoms worsen or if the condition fails to improve as anticipated.   Virtual Care [phone]  Other Instructions Otitis Externa  Otitis externa is an infection of the outer ear canal. The outer ear canal is the area between the outside of the ear and the eardrum. Otitis externa is sometimes called swimmer's ear. What are the causes? Common causes of this condition include: Swimming in dirty water. Moisture in the ear. An injury to the inside of the  ear. An object stuck in the ear. A cut or scrape on the outside of the ear or in the ear canal. What increases the risk? You are more likely to develop this condition if you go swimming often. What are the signs or symptoms? The first symptom of this condition is often itching in the ear. Later symptoms of the condition include: Swelling of the ear. Redness in the ear. Ear pain. The pain may get worse when you pull on your ear. Pus coming from the ear. How is this diagnosed? This condition may be diagnosed by examining the ear and testing fluid from the ear for  bacteria and funguses. How is this treated? This condition may be treated with: Antibiotic ear drops. These are often given for 10-14 days. Medicines to reduce itching and swelling. Follow these instructions at home: If you were prescribed antibiotic ear drops, use them as told by your health care provider. Do not stop using the antibiotic even if you start to feel better. Take over-the-counter and prescription medicines only as told by your health care provider. Avoid getting water in your ears as told by your health care provider. This may include avoiding swimming or water sports for a few days. Keep all follow-up visits. This is important. How is this prevented? Keep your ears dry. Use the corner of a towel to dry your ears after you swim or bathe. Avoid scratching or putting things in your ear. Doing these things can damage the ear canal or remove the protective wax that lines it, which makes it easier for bacteria and funguses to grow. Avoid swimming in lakes, polluted water, or swimming pools that may not have enough chlorine. Contact a health care provider if: You have a fever. Your ear is still red, swollen, painful, or draining pus after 3 days. Your redness, swelling, or pain gets worse. You have a severe headache. Get help right away if: You have redness, swelling, and pain or tenderness in the area behind your ear. Summary Otitis externa is an infection of the outer ear canal. Common causes include swimming in dirty water, moisture in the ear, or a cut or scrape in the ear. Symptoms include pain, redness, and swelling of the ear canal. If you were prescribed antibiotic ear drops, use them as told by your health care provider. Do not stop using the antibiotic even if you start to feel better. This information is not intended to replace advice given to you by your health care provider. Make sure you discuss any questions you have with your health care provider. Document Revised:  08/11/2020 Document Reviewed: 08/11/2020 Elsevier Patient Education  2024 [employer]   If you have been instructed to have an in-person evaluation today at a local Urgent Care facility, please use the link below. It will take you to a list of all of our available [facility], including address, phone number and hours of operation. Please do not delay care.  [facility]  If you or a family member do not have a primary care provider, use the link below to schedule a visit and establish care. When you choose a [facility] primary care physician or advanced practice provider, you gain a long-term partner in health. Find a Primary Care Provider  Learn more about [facility]'s in-office and virtual care options: [facility] - Get Care Now

## 2024-05-27 ENCOUNTER — Ambulatory Visit (INDEPENDENT_AMBULATORY_CARE_PROVIDER_SITE_OTHER): Admitting: Student in an Organized Health Care Education/Training Program

## 2024-05-27 ENCOUNTER — Encounter (HOSPITAL_COMMUNITY): Payer: Self-pay | Admitting: Student in an Organized Health Care Education/Training Program

## 2024-05-27 ENCOUNTER — Telehealth: Admitting: Physician Assistant

## 2024-05-27 VITALS — BP 157/106 | HR 90 | Ht 62.0 in | Wt 279.0 lb

## 2024-05-27 DIAGNOSIS — B3731 Acute candidiasis of vulva and vagina: Secondary | ICD-10-CM | POA: Diagnosis not present

## 2024-05-27 DIAGNOSIS — F3342 Major depressive disorder, recurrent, in full remission: Secondary | ICD-10-CM | POA: Diagnosis not present

## 2024-05-27 DIAGNOSIS — M9914 Subluxation complex (vertebral) of sacral region: Secondary | ICD-10-CM | POA: Diagnosis not present

## 2024-05-27 DIAGNOSIS — F4310 Post-traumatic stress disorder, unspecified: Secondary | ICD-10-CM | POA: Diagnosis not present

## 2024-05-27 DIAGNOSIS — F411 Generalized anxiety disorder: Secondary | ICD-10-CM | POA: Diagnosis not present

## 2024-05-27 DIAGNOSIS — M9915 Subluxation complex (vertebral) of pelvic region: Secondary | ICD-10-CM | POA: Diagnosis not present

## 2024-05-27 DIAGNOSIS — M9913 Subluxation complex (vertebral) of lumbar region: Secondary | ICD-10-CM | POA: Diagnosis not present

## 2024-05-27 MED ORDER — PRAZOSIN HCL 1 MG PO CAPS: 1.0000 mg | ORAL_CAPSULE | Freq: Every day | ORAL | 0 refills | Status: AC

## 2024-05-27 MED ORDER — BUPROPION HCL ER (XL) 300 MG PO TB24: 300.0000 mg | ORAL_TABLET | Freq: Every day | ORAL | 2 refills | Status: AC

## 2024-05-27 MED ORDER — PROPRANOLOL HCL 10 MG PO TABS: 10.0000 mg | ORAL_TABLET | Freq: Three times a day (TID) | ORAL | 1 refills | Status: AC

## 2024-05-27 MED ORDER — FLUCONAZOLE 150 MG PO TABS: 150.0000 mg | ORAL_TABLET | ORAL | 0 refills | Status: AC
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
## 2024-05-27 NOTE — Progress Notes (Signed)
 Virtual Visit Consent   Savreen Gebhardt, you are scheduled for a virtual visit with a [facility] provider today. Just as with appointments in the office, your consent must be obtained to participate. Your consent will be active for this visit and any virtual visit you may have with one of our providers in the next 365 days. If you have a MyChart account, a copy of this consent can be sent to you electronically.  As this is a virtual visit, video technology does not allow for your provider to perform a traditional examination. This may limit your provider's ability to fully assess your condition. If your provider identifies any concerns that need to be evaluated in person or the need to arrange testing (such as labs, EKG, etc.), we will make arrangements to do so. Although advances in technology are sophisticated, we cannot ensure that it will always work on either your end or our end. If the connection with a video visit is poor, the visit may have to be switched to a telephone visit. With either a video or telephone visit, we are not always able to ensure that we have a secure connection.  By engaging in this virtual visit, you consent to the provision of healthcare and authorize for your insurance to be billed (if applicable) for the services provided during this visit. Depending on your insurance coverage, you may receive a charge related to this service.  I need to obtain your verbal consent now. Are you willing to proceed with your visit today? Trevor Duty has provided verbal consent on 05/27/2024 for a virtual visit (video or telephone). Teena Shuck, [place]  Date: 05/27/2024 5:07 PM   Virtual Visit via Video Note   I, Teena Shuck, connected with  Crystal Koch  ([id number], 04/25/1981) on 05/27/2024 at  4:45 PM EST by a video-enabled telemedicine application and verified that I am speaking with the correct person using two identifiers.  Location: Patient: Virtual Visit Location Patient:  Home Provider: Virtual Visit Location Provider: Home Office   I discussed the limitations of evaluation and management by telemedicine and the availability of in person appointments. The patient expressed understanding and agreed to proceed.    History of Present Illness: Crystal Koch is a 43 y.o. who identifies as a female who was assigned female at birth, and is being seen today for yeast infection.  HPI: Vaginal Itching The patient's primary symptoms include genital itching. The patient's pertinent negatives include no genital odor. This is a new problem. The current episode started in the past 7 days. The problem has been unchanged. Pertinent negatives include no abdominal pain, anorexia, back pain, chills, discolored urine, dysuria, fever, flank pain, frequency, joint swelling, nausea, painful intercourse or rash. Nothing aggravates the symptoms. She has tried nothing for the symptoms. Her past medical history is significant for vaginosis.    Problems:  Patient Active Problem List   Diagnosis Date Noted   Obstructive sleep apnea 01/04/2024   Hyperlipidemia 01/02/2024   Prediabetes 01/02/2024   PTSD (post-traumatic stress disorder) 09/03/2023   GAD (generalized anxiety disorder) 09/03/2023   MDD (major depressive disorder), recurrent, in full remission 09/03/2023   Use of cannabis 09/03/2023   Anxiety 05/16/2023   Positive depression screening 04/18/2023   Lumbar radiculopathy 04/18/2023   Acute non-recurrent sinusitis 05/31/2021   Vitamin D  deficiency 06/22/2014   Sciatica of left side 06/19/2014   Current smoker 06/19/2014    Allergies: Allergies[1] Medications: Current Medications[2]  Observations/Objective: Patient is well-developed, well-nourished in no  acute distress.  Resting comfortably  at home.  Head is normocephalic, atraumatic.  No labored breathing.  Speech is clear and coherent with logical content.  Patient is alert and oriented at baseline.    Assessment  and Plan: 1. Yeast vaginitis (Primary)  Patient presenting with urinary urgency and frequency, I suspect acute yeast vaginitis. I also considered PID, pregnancy, ectopic pregnancy, endometriosis, UTI,  tubovarian abscess, appendicitis, BV ,  and pyelonephritis, but this appears less likely considering the data gathered thus far.  Medication prescribed. I have instructed the patient to present to the nearest ER at any time if there are any new or worsening symptoms.  The patient expressed understanding of and agreement with this plan.  Opportunity was given for questions prior to discharge and all stated questions were answered to the patient's satisfaction.    Follow Up Instructions: I discussed the assessment and treatment plan with the patient. The patient was provided an opportunity to ask questions and all were answered. The patient agreed with the plan and demonstrated an understanding of the instructions.  A copy of instructions were sent to the patient via MyChart unless otherwise noted below.    The patient was advised to call back or seek an in-person evaluation if the symptoms worsen or if the condition fails to improve as anticipated.    Evadean Sproule, PA-C    [1] No Known Allergies [2]  Current Outpatient Medications:    atorvastatin  (LIPITOR) 40 MG tablet, Take 1 tablet (40 mg total) by mouth daily., Disp: 90 tablet, Rfl: 1   buPROPion  (WELLBUTRIN  XL) 300 MG 24 hr tablet, Take 1 tablet (300 mg total) by mouth daily., Disp: 30 tablet, Rfl: 2   chlorhexidine  (PERIDEX ) 0.12 % solution, Use as directed 15 mLs in the mouth or throat 2 (two) times daily as needed. Swish and spit twice daily as needed, Disp: 120 mL, Rfl: 0   Cholecalciferol  (VITAMIN D3) 10 MCG (400 UNIT) tablet, Take 1 tablet (400 Units total) by mouth daily., Disp: 100 tablet, Rfl: 1   ciprofloxacin -dexamethasone  (CIPRODEX ) OTIC suspension, Place 4 drops into the left ear 2 (two) times daily for 7 days., Disp: 7.5 mL,  Rfl: 0   cyclobenzaprine  (FLEXERIL ) 5 MG tablet, Take 1 tablet (5 mg total) by mouth 3 (three) times daily as needed for muscle spasms., Disp: 30 tablet, Rfl: 2   fluticasone  (FLONASE ) 50 MCG/ACT nasal spray, Place 2 sprays into both nostrils daily., Disp: 16 g, Rfl: 6   gabapentin  (NEURONTIN ) 100 MG capsule, Take 1 capsule (100 mg total) by mouth 2 (two) times daily., Disp: 60 capsule, Rfl: 3   hydrOXYzine  (ATARAX ) 25 MG tablet, Take 1-2 tablets (25-50 mg total) by mouth daily as needed for anxiety., Disp: 60 tablet, Rfl: 2   meloxicam  (MOBIC ) 15 MG tablet, Take 15 mg by mouth daily., Disp: , Rfl:    Misc. Devices MISC, Auto PAP 5 to 15 cm of water.  Diagnosis-obstructive sleep apnea, Disp: 1 each, Rfl: 0   omeprazole  (PRILOSEC) 40 MG capsule, Take 1 capsule (40 mg total) by mouth 2 (two) times daily., Disp: 180 capsule, Rfl: 1   prazosin  (MINIPRESS ) 1 MG capsule, Take 1 capsule (1 mg total) by mouth at bedtime., Disp: 90 capsule, Rfl: 0   propranolol  (INDERAL ) 10 MG tablet, Take 1 tablet (10 mg total) by mouth 3 (three) times daily., Disp: 90 tablet, Rfl: 1

## 2024-05-27 NOTE — Patient Instructions (Signed)
 [name], thank you for joining Teena Shuck, PA-C for today's virtual visit.  While this provider is not your primary care provider (PCP), if your PCP is located in our provider database this encounter information will be shared with them immediately following your visit.   A [facility] MyChart account gives you access to today's visit and all your visits, tests, and labs performed at [facility]  click here if you don't have a [facility] MyChart account or go to [email or web address]  Consent: (Patient) Crystal Koch provided verbal consent for this virtual visit at the beginning of the encounter.  Current Medications:  Current Outpatient Medications:    fluconazole  (DIFLUCAN ) 150 MG tablet, Take 1 tablet (150 mg total) by mouth every 3 (three) days for 2 doses., Disp: 2 tablet, Rfl: 0   atorvastatin  (LIPITOR) 40 MG tablet, Take 1 tablet (40 mg total) by mouth daily., Disp: 90 tablet, Rfl: 1   buPROPion  (WELLBUTRIN  XL) 300 MG 24 hr tablet, Take 1 tablet (300 mg total) by mouth daily., Disp: 30 tablet, Rfl: 2   chlorhexidine  (PERIDEX ) 0.12 % solution, Use as directed 15 mLs in the mouth or throat 2 (two) times daily as needed. Swish and spit twice daily as needed, Disp: 120 mL, Rfl: 0   Cholecalciferol  (VITAMIN D3) 10 MCG (400 UNIT) tablet, Take 1 tablet (400 Units total) by mouth daily., Disp: 100 tablet, Rfl: 1   ciprofloxacin -dexamethasone  (CIPRODEX ) OTIC suspension, Place 4 drops into the left ear 2 (two) times daily for 7 days., Disp: 7.5 mL, Rfl: 0   cyclobenzaprine  (FLEXERIL ) 5 MG tablet, Take 1 tablet (5 mg total) by mouth 3 (three) times daily as needed for muscle spasms., Disp: 30 tablet, Rfl: 2   fluticasone  (FLONASE ) 50 MCG/ACT nasal spray, Place 2 sprays into both nostrils daily., Disp: 16 g, Rfl: 6   gabapentin  (NEURONTIN ) 100 MG capsule, Take 1 capsule (100 mg total) by mouth 2 (two) times daily., Disp: 60 capsule, Rfl: 3   hydrOXYzine  (ATARAX ) 25 MG  tablet, Take 1-2 tablets (25-50 mg total) by mouth daily as needed for anxiety., Disp: 60 tablet, Rfl: 2   meloxicam  (MOBIC ) 15 MG tablet, Take 15 mg by mouth daily., Disp: , Rfl:    Misc. Devices MISC, Auto PAP 5 to 15 cm of water.  Diagnosis-obstructive sleep apnea, Disp: 1 each, Rfl: 0   omeprazole  (PRILOSEC) 40 MG capsule, Take 1 capsule (40 mg total) by mouth 2 (two) times daily., Disp: 180 capsule, Rfl: 1   prazosin  (MINIPRESS ) 1 MG capsule, Take 1 capsule (1 mg total) by mouth at bedtime., Disp: 90 capsule, Rfl: 0   propranolol  (INDERAL ) 10 MG tablet, Take 1 tablet (10 mg total) by mouth 3 (three) times daily., Disp: 90 tablet, Rfl: 1   Medications ordered in this encounter:  Meds ordered this encounter  Medications   fluconazole  (DIFLUCAN ) 150 MG tablet    Sig: Take 1 tablet (150 mg total) by mouth every 3 (three) days for 2 doses.    Dispense:  2 tablet    Refill:  0    Supervising Provider:   LAMPTEY, PHILIP O [[id number]]     *If you need refills on other medications prior to your next appointment, please contact your pharmacy*  Follow-Up: Call back or seek an in-person evaluation if the symptoms worsen or if the condition fails to improve as anticipated.  [facility] [phone]  Other Instructions Follow up with primary provider in 24-48 hours. Report  to nearest ER with any worsening symptoms.    If you have been instructed to have an in-person evaluation today at a local Urgent Care facility, please use the link below. It will take you to a list of all of our available [facility], including address, phone number and hours of operation. Please do not delay care.  [facility]  If you or a family member do not have a primary care provider, use the link below to schedule a visit and establish care. When you choose a [facility] primary care physician or advanced practice provider, you gain a long-term partner in health. Find a Primary  Care Provider  Learn more about [facility]'s in-office and virtual care options: [facility] - Get Care Now

## 2024-06-03 ENCOUNTER — Other Ambulatory Visit: Payer: Self-pay | Admitting: Family Medicine

## 2024-06-03 ENCOUNTER — Encounter (INDEPENDENT_AMBULATORY_CARE_PROVIDER_SITE_OTHER): Payer: Self-pay | Admitting: Physician Assistant

## 2024-06-03 ENCOUNTER — Encounter: Payer: Self-pay | Admitting: Family Medicine

## 2024-06-03 ENCOUNTER — Ambulatory Visit (INDEPENDENT_AMBULATORY_CARE_PROVIDER_SITE_OTHER): Admitting: Physician Assistant

## 2024-06-03 VITALS — BP 153/111 | HR 91 | Temp 98.1°F | Ht 62.0 in | Wt 175.0 lb

## 2024-06-03 DIAGNOSIS — H9193 Unspecified hearing loss, bilateral: Secondary | ICD-10-CM

## 2024-06-03 DIAGNOSIS — M51369 Other intervertebral disc degeneration, lumbar region without mention of lumbar back pain or lower extremity pain: Secondary | ICD-10-CM

## 2024-06-03 DIAGNOSIS — H9313 Tinnitus, bilateral: Secondary | ICD-10-CM | POA: Diagnosis not present

## 2024-06-03 NOTE — Progress Notes (Signed)
 Patient having BP managed. Patient took BP medication about an hour ago.

## 2024-06-03 NOTE — Progress Notes (Signed)
" °  [address], [address] [place], [place] [address] [phone]  Audiological Evaluation    Name: Crystal Koch     DOB:   02-Aug-1980      MRN:   [id number]                                                                                     Service Date: 06/03/2024     Accompanied by: self    Patient comes today after Reyes Cohen, PA-C sent a referral for a hearing evaluation due to concerns with hearing loss.   Symptoms Yes Details  Hearing loss  [x]  Some hearing difficulty, others indicating that she speaks loud  Tinnitus  [x]  Tinnitus in both ears  Ear pain/ infections/pressure  [x]  No current ear pain but recently an left ear canal infection  Balance problems  [x]  Any time during the day, lasting seconds, since April  Noise exposure history  []    Previous ear surgeries  []    Family history of hearing loss  []    Amplification  []    Other  []      Otoscopy: Right ear: Clear external ear canal and notable landmarks visualized on the tympanic membrane. Left ear:  Clear external ear canal and notable landmarks visualized on the tympanic membrane.  Tympanometry: Right ear: Type A - Normal external ear canal volume with normal middle ear pressure and normal tympanic membrane compliance. Findings are consistent with normal middle ear function. Left ear: Type A - Normal external ear canal volume with normal middle ear pressure and normal tympanic membrane compliance. Findings are consistent with normal middle ear function.   Hearing Evaluation The hearing test results were completed under headphones and results are deemed to be of good reliability. Test technique:  conventional    Pure tone Audiometry: Right ear- Borderline normal hearing [phone] sloping to mild presumably sensorineural hearing loss from 6000 Hz - 8000 Hz. Left ear-  Borderline normal hearing [phone] Hz sloping to mild presumably sensorineural hearing loss at 8000 Hz.  Speech Audiometry: Right ear-  Speech Reception Threshold (SRT) was obtained at 20 dBHL. Left ear-Speech Reception Threshold (SRT) was obtained at 20 dBHL.   Word Recognition Score Tested using NU-6 (recorded) Right ear: 100% was obtained at a presentation level of 60 dBHL with contralateral masking which is deemed as  excellent. Left ear: 100% was obtained at a presentation level of 60 dBHL with contralateral masking which is deemed as  excellent.   Impression: There is not a significant difference in pure-tone thresholds between ears., There is not a significant difference in the word recognition score in between ears.    Recommendations: Follow up with ENT as scheduled. Return for a hearing evaluation if concerns with hearing changes arise or per MD recommendation.    Arlean Rake, AUD  "

## 2024-06-03 NOTE — Progress Notes (Signed)
 Dear Dr. Vicci, Here is my assessment for our mutual patient, Crystal Koch. Thank you for allowing me the opportunity to care for your patient. Please do not hesitate to contact me should you have any other questions. Sincerely, Chyrl Cohen PA-C  Otolaryngology Clinic Note Referring provider: Dr. Vicci HPI:  Crystal Koch is a 43 y.o. female kindly referred by Dr. Vicci   Discussed the use of AI scribe software for clinical note transcription with the patient, who gave verbal consent to proceed.  History of Present Illness   Crystal Koch is a 43 year old female who presents with several months of bilateral hearing loss.  She reports progressive hearing loss in both ears, first noted approximately March or April of this year, with worsening symptoms over the past year. She has increasing difficulty understanding conversations, frequently requires repetition, and has been told she speaks loudly.  She experiences persistent, bilateral tinnitus described as a constant buzzing sound.  She had an ear infection last week, treated with a 10-day course of ear drops. She does not have a history of recurrent ear infections, prior otologic surgery, or trauma.  She notes intermittent aural fullness and occasionally perceives liquid when blowing her nose, though she does not feel congested today. She denies otalgia and neurologic symptoms.           Independent Review of Additional Tests or Records:  none   PMH/Meds/All/SocHx/FamHx/ROS:   Past Medical History:  Diagnosis Date   Asthma 06/12/1984   Depression    PTSD (post-traumatic stress disorder)    Sciatica 06/12/2009   since son was born. exacerbated every winter.      Past Surgical History:  Procedure Laterality Date   CESAREAN SECTION  2011    Family History  Problem Relation Age of Onset   Thyroid disease Mother    Mental illness Mother    Cancer Father    Hypertension Maternal Uncle    Cancer Maternal Grandmother     Diabetes Neg Hx      Social Connections: Moderately Isolated (04/28/2024)   Social Connection and Isolation Panel    Frequency of Communication with Friends and Family: More than three times a week    Frequency of Social Gatherings with Friends and Family: More than three times a week    Attends Religious Services: Never    Database Administrator or Organizations: Yes    Attends Engineer, Structural: More than 4 times per year    Marital Status: Separated     Current Medications[1]   Physical Exam:   BP (!) 153/111   Pulse 91   Temp 98.1 F (36.7 C)   Ht 5' 2 (1.575 m)   Wt 175 lb (79.4 kg)   SpO2 98%   BMI 32.01 kg/m   Pertinent Findings  CN II-XII grossly intact Bilateral EAC clear and TM intact with well pneumatized middle ear spaces Weber 512: equal Rinne 512: AC > BC b/l  Anterior rhinoscopy: Septum midline; bilateral inferior turbinates with no hypertrophy  No lesions of oral cavity/oropharynx; dentition wnl No obviously palpable neck masses/lymphadenopathy/thyromegaly No respiratory distress or stridor   Seprately Identifiable Procedures:  None  Impression & Plans:  Crystal Koch is a 43 y.o. female with the following   Assessment and Plan    Bilateral hearing loss with tinnitus Bilateral hearing loss with constant tinnitus. Etiology unclear. Further evaluation needed to determine hearing levels and symmetry. - Ordered audiogram to assess hearing levels and symmetry. -  Scheduled telephone follow-up post-audiogram to review results and discuss management, including potential hearing aids.           - f/u telephone visit to discuss results    Thank you for allowing me the opportunity to care for your patient. Please do not hesitate to contact me should you have any other questions.  Sincerely, Chyrl Cohen PA-C [facility] Phone: [phone] Fax: [phone]  06/03/2024, 12:57 PM        [1]  Current Outpatient  Medications:    atorvastatin  (LIPITOR) 40 MG tablet, Take 1 tablet (40 mg total) by mouth daily., Disp: 90 tablet, Rfl: 1   buPROPion  (WELLBUTRIN  XL) 300 MG 24 hr tablet, Take 1 tablet (300 mg total) by mouth daily., Disp: 30 tablet, Rfl: 2   chlorhexidine  (PERIDEX ) 0.12 % solution, Use as directed 15 mLs in the mouth or throat 2 (two) times daily as needed. Swish and spit twice daily as needed, Disp: 120 mL, Rfl: 0   Cholecalciferol  (VITAMIN D3) 10 MCG (400 UNIT) tablet, Take 1 tablet (400 Units total) by mouth daily., Disp: 100 tablet, Rfl: 1   cyclobenzaprine  (FLEXERIL ) 5 MG tablet, Take 1 tablet (5 mg total) by mouth 3 (three) times daily as needed for muscle spasms., Disp: 30 tablet, Rfl: 2   fluticasone  (FLONASE ) 50 MCG/ACT nasal spray, Place 2 sprays into both nostrils daily., Disp: 16 g, Rfl: 6   gabapentin  (NEURONTIN ) 100 MG capsule, Take 1 capsule (100 mg total) by mouth 2 (two) times daily., Disp: 60 capsule, Rfl: 3   hydrOXYzine  (ATARAX ) 25 MG tablet, Take 1-2 tablets (25-50 mg total) by mouth daily as needed for anxiety., Disp: 60 tablet, Rfl: 2   meloxicam  (MOBIC ) 15 MG tablet, Take 15 mg by mouth daily., Disp: , Rfl:    Misc. Devices MISC, Auto PAP 5 to 15 cm of water.  Diagnosis-obstructive sleep apnea, Disp: 1 each, Rfl: 0   omeprazole  (PRILOSEC) 40 MG capsule, Take 1 capsule (40 mg total) by mouth 2 (two) times daily., Disp: 180 capsule, Rfl: 1   prazosin  (MINIPRESS ) 1 MG capsule, Take 1 capsule (1 mg total) by mouth at bedtime., Disp: 90 capsule, Rfl: 0   propranolol  (INDERAL ) 10 MG tablet, Take 1 tablet (10 mg total) by mouth 3 (three) times daily., Disp: 90 tablet, Rfl: 1

## 2024-06-04 ENCOUNTER — Ambulatory Visit (INDEPENDENT_AMBULATORY_CARE_PROVIDER_SITE_OTHER)

## 2024-06-04 DIAGNOSIS — H903 Sensorineural hearing loss, bilateral: Secondary | ICD-10-CM | POA: Diagnosis not present

## 2024-06-13 ENCOUNTER — Ambulatory Visit (INDEPENDENT_AMBULATORY_CARE_PROVIDER_SITE_OTHER): Admitting: Physician Assistant

## 2024-06-13 DIAGNOSIS — H903 Sensorineural hearing loss, bilateral: Secondary | ICD-10-CM

## 2024-06-13 NOTE — Progress Notes (Signed)
 Dear Dr. Newlin, Here is my assessment for our mutual patient, Crystal Koch. Thank you for allowing me the opportunity to care for your patient. Please do not hesitate to contact me should you have any other questions. Sincerely, Chyrl Cohen PA-C  Otolaryngology Clinic Note Referring provider: Dr. Delbert HPI:  Crystal Koch is a 44 y.o. female kindly referred by Dr. Delbert    Visit conducted via real-time audio/video telehealth platform. Patient location: [place] . Provider location: [place] . Patient identity verified. Consent for telehealth obtained. History and exam performed within limitations of telehealth. Total time spent: 10 minutes   Independent Review of Additional Tests or Records:   Audiological evaluation 06/04/2024  Otoscopy: Right ear: Clear external ear canal and notable landmarks visualized on the tympanic membrane. Left ear:  Clear external ear canal and notable landmarks visualized on the tympanic membrane.   Tympanometry: Right ear: Type A - Normal external ear canal volume with normal middle ear pressure and normal tympanic membrane compliance. Findings are consistent with normal middle ear function. Left ear: Type A - Normal external ear canal volume with normal middle ear pressure and normal tympanic membrane compliance. Findings are consistent with normal middle ear function.     Hearing Evaluation The hearing test results were completed under headphones and results are deemed to be of good reliability. Test technique:  conventional     Pure tone Audiometry: Right ear- Borderline normal hearing [phone] sloping to mild presumably sensorineural hearing loss from 6000 Hz - 8000 Hz. Left ear-  Borderline normal hearing [phone] sloping to mild presumably sensorineural hearing loss at 8000 Hz.   Speech Audiometry: Right ear- Speech Reception Threshold (SRT) was obtained at 20 dBHL. Left ear-Speech Reception Threshold (SRT) was obtained at  20 dBHL.   Word Recognition Score Tested using NU-6 (recorded) Right ear: 100% was obtained at a presentation level of 60 dBHL with contralateral masking which is deemed as  excellent. Left ear: 100% was obtained at a presentation level of 60 dBHL with contralateral masking which is deemed as  excellent.   Impression: There is not a significant difference in pure-tone thresholds between ears., There is not a significant difference in the word recognition score in between ears.    Recommendations: Follow up with ENT as scheduled. Return for a hearing evaluation if concerns with hearing changes arise or per MD recommendation.    PMH/Meds/All/SocHx/FamHx/ROS:   Past Medical History:  Diagnosis Date   Asthma 06/12/1984   Depression    PTSD (post-traumatic stress disorder)    Sciatica 06/12/2009   since son was born. exacerbated every winter.      Past Surgical History:  Procedure Laterality Date   CESAREAN SECTION  2011    Family History  Problem Relation Age of Onset   Thyroid disease Mother    Mental illness Mother    Cancer Father    Hypertension Maternal Uncle    Cancer Maternal Grandmother    Diabetes Neg Hx      Social Connections: Moderately Isolated (04/28/2024)   Social Connection and Isolation Panel    Frequency of Communication with Friends and Family: More than three times a week    Frequency of Social Gatherings with Friends and Family: More than three times a week    Attends Religious Services: Never    Database Administrator or Organizations: Yes    Attends Engineer, Structural: More than 4 times per year    Marital Status: Separated  Current Medications[1]   Physical Exam:   There were no vitals taken for this visit.  Pertinent Findings  Telephone visit  History and exam performed within limitations of telehealth. Patient speaking in full sentences in no acute distress    Seprately Identifiable Procedures:  None  Impression &  Plans:  Crystal Koch is a 44 y.o. female with the following   Follow-up evaluation for hearing loss.  She does have some minimal high-frequency hearing loss. This seemed to impact her quality of life.  I have advised her to follow-up with audiology to discuss hearing aids to see if this would help.  Otherwise I will happily see her back in the office with any further questions or concerns.   - f/u PRN    Thank you for allowing me the opportunity to care for your patient. Please do not hesitate to contact me should you have any other questions.  Sincerely, Chyrl Cohen PA-C [facility] Phone: [phone] Fax: [phone]  06/13/2024, 11:36 AM      [1]  Current Outpatient Medications:    atorvastatin  (LIPITOR) 40 MG tablet, Take 1 tablet (40 mg total) by mouth daily., Disp: 90 tablet, Rfl: 1   buPROPion  (WELLBUTRIN  XL) 300 MG 24 hr tablet, Take 1 tablet (300 mg total) by mouth daily., Disp: 30 tablet, Rfl: 2   chlorhexidine  (PERIDEX ) 0.12 % solution, Use as directed 15 mLs in the mouth or throat 2 (two) times daily as needed. Swish and spit twice daily as needed, Disp: 120 mL, Rfl: 0   Cholecalciferol  (VITAMIN D3) 10 MCG (400 UNIT) tablet, Take 1 tablet (400 Units total) by mouth daily., Disp: 100 tablet, Rfl: 1   cyclobenzaprine  (FLEXERIL ) 5 MG tablet, Take 1 tablet (5 mg total) by mouth 3 (three) times daily as needed for muscle spasms., Disp: 30 tablet, Rfl: 2   fluticasone  (FLONASE ) 50 MCG/ACT nasal spray, Place 2 sprays into both nostrils daily., Disp: 16 g, Rfl: 6   gabapentin  (NEURONTIN ) 100 MG capsule, Take 1 capsule (100 mg total) by mouth 2 (two) times daily., Disp: 60 capsule, Rfl: 3   hydrOXYzine  (ATARAX ) 25 MG tablet, Take 1-2 tablets (25-50 mg total) by mouth daily as needed for anxiety., Disp: 60 tablet, Rfl: 2   meloxicam  (MOBIC ) 15 MG tablet, Take 15 mg by mouth daily., Disp: , Rfl:    Misc. Devices MISC, Auto PAP 5 to 15 cm of water.   Diagnosis-obstructive sleep apnea, Disp: 1 each, Rfl: 0   omeprazole  (PRILOSEC) 40 MG capsule, Take 1 capsule (40 mg total) by mouth 2 (two) times daily., Disp: 180 capsule, Rfl: 1   prazosin  (MINIPRESS ) 1 MG capsule, Take 1 capsule (1 mg total) by mouth at bedtime., Disp: 90 capsule, Rfl: 0   propranolol  (INDERAL ) 10 MG tablet, Take 1 tablet (10 mg total) by mouth 3 (three) times daily., Disp: 90 tablet, Rfl: 1

## 2024-06-16 ENCOUNTER — Institutional Professional Consult (permissible substitution) (INDEPENDENT_AMBULATORY_CARE_PROVIDER_SITE_OTHER): Admitting: Physician Assistant

## 2024-06-24 NOTE — Progress Notes (Unsigned)
 "  Office Visit Note  Patient: Crystal Koch             Date of Birth: 1980/07/03           MRN: [id number]             PCP: Delbert Clam, MD Referring: Burnetta Brunet, DO Visit Date: 07/07/2024 Occupation: Data Unavailable  Subjective:  No chief complaint on file.   History of Present Illness: Crystal Koch is a 44 y.o. female ***     Activities of Daily Living:  Patient reports morning stiffness for *** {minute/hour:19697}.   Patient {ACTIONS;DENIES/REPORTS:21021675::Denies} nocturnal pain.  Difficulty dressing/grooming: {ACTIONS;DENIES/REPORTS:21021675::Denies} Difficulty climbing stairs: {ACTIONS;DENIES/REPORTS:21021675::Denies} Difficulty getting out of chair: {ACTIONS;DENIES/REPORTS:21021675::Denies} Difficulty using hands for taps, buttons, cutlery, and/or writing: {ACTIONS;DENIES/REPORTS:21021675::Denies}  No Rheumatology ROS completed.   PMFS History:  Patient Active Problem List   Diagnosis Date Noted   Obstructive sleep apnea 01/04/2024   Hyperlipidemia 01/02/2024   Prediabetes 01/02/2024   PTSD (post-traumatic stress disorder) 09/03/2023   GAD (generalized anxiety disorder) 09/03/2023   MDD (major depressive disorder), recurrent, in full remission 09/03/2023   Use of cannabis 09/03/2023   Anxiety 05/16/2023   Positive depression screening 04/18/2023   Lumbar radiculopathy 04/18/2023   Acute non-recurrent sinusitis 05/31/2021   Vitamin D  deficiency 06/22/2014   Sciatica of left side 06/19/2014   Current smoker 06/19/2014    Past Medical History:  Diagnosis Date   Asthma 06/12/1984   Depression    PTSD (post-traumatic stress disorder)    Sciatica 06/12/2009   since son was born. exacerbated every winter.     Family History  Problem Relation Age of Onset   Thyroid disease Mother    Mental illness Mother    Cancer Father    Hypertension Maternal Uncle    Cancer Maternal Grandmother    Diabetes Neg Hx    Past Surgical History:  Procedure  Laterality Date   CESAREAN SECTION  2011   Social History[1] Social History   Social History Narrative   Lives at home with 4 yo son.       There is no immunization history on file for this patient.   Objective: Vital Signs: There were no vitals taken for this visit.   Physical Exam   Musculoskeletal Exam: ***  CDAI Exam: CDAI Score: -- Patient Global: --; Provider Global: -- Swollen: --; Tender: -- Joint Exam 07/07/2024   No joint exam has been documented for this visit   There is currently no information documented on the homunculus. Go to the Rheumatology activity and complete the homunculus joint exam.  Investigation: No additional findings.  Imaging: No results found.  Recent Labs: Lab Results  Component Value Date   WBC 6.6 04/14/2024   HGB 13.2 04/14/2024   PLT 306 04/14/2024   NA 138 06/25/2023   K 4.4 06/25/2023   CL 100 06/25/2023   CO2 25 06/25/2023   GLUCOSE 78 06/25/2023   BUN 11 06/25/2023   CREATININE 0.66 06/25/2023   BILITOT 0.2 06/25/2023   ALKPHOS 79 06/25/2023   AST 19 06/25/2023   ALT 22 06/25/2023   PROT 7.2 06/25/2023   ALBUMIN 4.6 06/25/2023   CALCIUM  9.4 06/25/2023   GFRAA >89 06/19/2014    Speciality Comments: No specialty comments available.  Procedures:  No procedures performed Allergies: Patient has no known allergies.   Assessment / Plan:     Visit Diagnoses: Bilateral hand pain  Positive ANA (antinuclear antibody) - 04/14/24: ANA 1:320 NN, CCP<16, RF<10,  CRP WNL  Morning joint stiffness  Vitamin D  deficiency  Obstructive sleep apnea  Lumbar radiculopathy  PTSD (post-traumatic stress disorder)  Prediabetes  MDD (major depressive disorder), recurrent, in full remission  GAD (generalized anxiety disorder)  Current smoker  History of hyperlipidemia  Orders: No orders of the defined types were placed in this encounter.  No orders of the defined types were placed in this encounter.   Face-to-face  time spent with patient was *** minutes. Greater than 50% of time was spent in counseling and coordination of care.  Follow-Up Instructions: No follow-ups on file.   [place]ell CHRISTELLA Craze, PA-C  Note - This record has been created using Dragon software.  Chart creation errors have been sought, but may not always  have been located. Such creation errors do not reflect on  the standard of medical care.     [1]  Social History Tobacco Use   Smoking status: Some Days    Current packs/day: 0.05    Average packs/day: 0.2 packs/day for 16.1 years (4.0 ttl pk-yrs)    Types: Cigarettes    Start date: 10/03/2023    Last attempt to quit: 10/22/2023   Smokeless tobacco: Never   Tobacco comments:    Patient explains that she has been smoking one cigarette here and there since 06/01/2024  Vaping Use   Vaping status: Never Used  Substance Use Topics   Alcohol use: No   Drug use: Yes    Types: Marijuana    Comment: prior microdosing of mushrooms twice weekly stopped as of 09/11/23. Marijuana: current use of CBD/THC   "

## 2024-07-01 ENCOUNTER — Other Ambulatory Visit: Payer: Self-pay | Admitting: Family Medicine

## 2024-07-02 ENCOUNTER — Other Ambulatory Visit: Payer: Self-pay

## 2024-07-02 MED ORDER — VITAMIN D3 10 MCG (400 UNIT) PO TABS
400.0000 [IU] | ORAL_TABLET | Freq: Every day | ORAL | 1 refills | Status: AC
  Filled 2024-07-02: qty 100, 100d supply, fill #0

## 2024-07-02 NOTE — Progress Notes (Unsigned)
 "  Office Visit Note  Patient: Crystal Koch             Date of Birth: [birth year]           MRN: [id number]             PCP: Delbert Clam, MD Referring: Burnetta Brunet, DO Visit Date: 07/03/2024 Occupation: Data Unavailable  Subjective:  Pain in both hands   History of Present Illness: Crystal Koch is a 44 y.o. female who presents today for a new patient consultation.  Patient reports for the past 1 year she has been experiencing increased pain affecting both hands.  Patient states that her symptoms were exacerbated by repetitive and overuse activities while working at [employer].  Patient states that she is now disabled due to being unable to perform responsibilities at work due to severity of symptoms.  Patient states that the soreness and stiffness in both hands is worse first thing in the mornings but has been consistent on a daily basis.  Patient had a nerve conduction study which was normal in May 2025.  Patient states she also has discomfort and stiffness affecting both feet especially if standing for longer than 4 hours.  Patient states that she has had plantar fasciitis affecting the left foot for the past 4 years.  Patient has been under the care of podiatry at [facility].  Patient has tried cortisone injections, wearing a boot, wearing orthotics and now her symptoms are more manageable.  She has had intermittent discomfort on the lateral aspect of both hips intermittently.  She denies any groin pain.  She denies any other joint pain or joint swelling at this time.  Patient is been taking meloxicam  15 mg 1 tablet daily for the past year for symptomatic relief.  Patient states that she will be switching from meloxicam  to diclofenac  50 mg twice daily but has not yet picked up the new prescription.  She takes Tylenol  as needed for breakthrough symptoms.  Patient states she has also noticed increased fatigue despite making some dietary changes and trying to increase her activity level. She  has noticed intermittent symptoms of Raynaud's phenomenon in the fingertips and toes off-and-on for many years.  She denies any rashes.  She denies any history of psoriasis.  She denies any ulcerative colitis or Crohn's disease.  She denies any history of uveitis.  She denies any blood clots or miscarriage.  She denies any oral or nasal ulcerations.  She denies any hair loss.    Activities of Daily Living:  Patient reports morning stiffness for 30-45 minutes.   Patient Denies nocturnal pain.  Difficulty dressing/grooming: Denies Difficulty climbing stairs: Denies Difficulty getting out of chair: Denies Difficulty using hands for taps, buttons, cutlery, and/or writing: Reports  Review of Systems  Constitutional:  Positive for fatigue.  HENT:  Positive for mouth dryness. Negative for mouth sores.   Eyes:  Positive for dryness.  Gastrointestinal:  Negative for blood in stool, constipation and diarrhea.  Endocrine: Negative for increased urination.  Genitourinary:  Negative for involuntary urination.  Musculoskeletal:  Positive for gait problem, myalgias, morning stiffness, muscle tenderness and myalgias. Negative for joint pain, joint pain, joint swelling and muscle weakness.  Skin:  Negative for color change, rash, hair loss and sensitivity to sunlight.  Allergic/Immunologic: Negative for susceptible to infections.  Neurological:  Positive for dizziness. Negative for headaches.  Hematological:  Negative for swollen glands.  Psychiatric/Behavioral:  Negative for depressed mood and sleep disturbance. The  patient is not nervous/anxious.     PMFS History:  Patient Active Problem List   Diagnosis Date Noted   Obstructive sleep apnea 01/04/2024   Hyperlipidemia 01/02/2024   Prediabetes 01/02/2024   PTSD (post-traumatic stress disorder) 09/03/2023   GAD (generalized anxiety disorder) 09/03/2023   MDD (major depressive disorder), recurrent, in full remission 09/03/2023   Use of cannabis  09/03/2023   Anxiety 05/16/2023   Positive depression screening 04/18/2023   Lumbar radiculopathy 04/18/2023   Acute non-recurrent sinusitis 05/31/2021   Vitamin D  deficiency 06/22/2014   Sciatica of left side 06/19/2014   Current smoker 06/19/2014    Past Medical History:  Diagnosis Date   Asthma 06/12/1984   Depression    PTSD (post-traumatic stress disorder)    Sciatica 06/12/2009   since son was born. exacerbated every winter.     Family History  Problem Relation Age of Onset   Thyroid disease Mother    Mental illness Mother    Cancer Father    Cancer Maternal Grandmother    Hypertension Maternal Uncle    Diabetes Neg Hx    Past Surgical History:  Procedure Laterality Date   CESAREAN SECTION  2011   Social History[1] Social History   Social History Narrative   Lives at home with 4 yo son.       There is no immunization history on file for this patient.   Objective: Vital Signs: BP (!) 156/95 (BP Location: Right Arm, Patient Position: Sitting, Cuff Size: Normal)   Pulse 80   Temp 98.2 F (36.8 C)   Resp 14   Ht 5' 1.5 (1.562 m)   Wt 176 lb (79.8 kg)   LMP 06/24/2024   BMI 32.72 kg/m    Physical Exam Vitals and nursing note reviewed.  Constitutional:      Appearance: She is well-developed.  HENT:     Head: Normocephalic and atraumatic.  Eyes:     Conjunctiva/sclera: Conjunctivae normal.  Cardiovascular:     Rate and Rhythm: Normal rate and regular rhythm.     Heart sounds: Normal heart sounds.  Pulmonary:     Effort: Pulmonary effort is normal.     Breath sounds: Normal breath sounds.  Abdominal:     General: Bowel sounds are normal.     Palpations: Abdomen is soft.  Musculoskeletal:     Cervical back: Normal range of motion.  Lymphadenopathy:     Cervical: No cervical adenopathy.  Skin:    General: Skin is warm and dry.     Capillary Refill: Capillary refill takes less than 2 seconds.  Neurological:     Mental Status: She is alert and  oriented to person, place, and time.  Psychiatric:        Behavior: Behavior normal.      Musculoskeletal Exam: C-spine, thoracic spine, lumbar spine have good range of motion.  No midline spinal tenderness.  No SI joint tenderness.  Shoulder joints, elbow joints, wrist joints, MCPs, PIPs, DIPs have good range of motion with no synovitis.  Tenderness across all MCP and PIP joints.  Complete fist formation bilaterally.  Hip joints have good range of motion with no groin pain.  Knee joints have good range of motion no warmth or effusion.  Ankle joints have good range of motion no tenderness or joint swelling.  No evidence of Achilles tendinitis or plantar fasciitis.  No tenderness of MTP joints.    CDAI Exam: CDAI Score: -- Patient Global: --; Provider Global: -- Swollen: --;  Tender: -- Joint Exam 07/03/2024   No joint exam has been documented for this visit   There is currently no information documented on the homunculus. Go to the Rheumatology activity and complete the homunculus joint exam.  Investigation: No additional findings.  Imaging: No results found.  Recent Labs: Lab Results  Component Value Date   WBC 6.6 04/14/2024   HGB 13.2 04/14/2024   PLT 306 04/14/2024   NA 138 06/25/2023   K 4.4 06/25/2023   CL 100 06/25/2023   CO2 25 06/25/2023   GLUCOSE 78 06/25/2023   BUN 11 06/25/2023   CREATININE 0.66 06/25/2023   BILITOT 0.2 06/25/2023   ALKPHOS 79 06/25/2023   AST 19 06/25/2023   ALT 22 06/25/2023   PROT 7.2 06/25/2023   ALBUMIN 4.6 06/25/2023   CALCIUM  9.4 06/25/2023   GFRAA >89 06/19/2014    Speciality Comments: No specialty comments available.  Procedures:  No procedures performed Allergies: Patient has no known allergies.   Assessment / Plan:     Visit Diagnoses: Bilateral hand pain - 04/14/24: CRP<3, ANA 1:320NN, CCP<16, RF<10, vitamin D  28:  Bilateral hand pain since 2025, AM stiffness, x-rays overall unremarkable on 10/22/2023, NCV with EMG normal  on 11/02/2023, meloxicam  15 mg daily x 1 year, no family history of rheumatologic conditions. X-rays of both hands obtained on 10/22/23: 3 views of the right and left hand including AP, oblique and lateral film were ordered and reviewed by myself. X-rays demonstrate no significant arthritic change about the hands or digits. There is a slight negative ulnar variance bilaterally. No other acute bony abnormality noted.  Patient is been experiencing bilateral hand pain on a daily basis.  Her symptoms are most severe first thing in the mornings.  She has been taking meloxicam  15 mg 1 tablet daily for the past 1 year but will be transitioning to diclofenac  50 mg 1 tablet twice daily for symptomatic relief once she picks up the new prescription.  She takes Tylenol  for breakthrough symptoms.  She has not noticed any joint swelling but has had soreness and stiffness pursing in the mornings.  Her symptoms are exacerbated by repetitive and overuse activities.  She has tenderness across the MCP and PIP joints especially in the right hand. Plan to obtain the following lab work for further evaluation.  Will likely plan to schedule an ultrasound to assess for synovitis.  - Plan: Mutated Citrullinated Vimentin (MCV) Antibody, C-reactive protein, Sedimentation rate  Morning stiffness of joints - AM stiffness lasting 30-45 minutes.  RF and anti-CCP negative on 04/14/2024.  Plan to check the following lab work today.  Plan: Mutated Citrullinated Vimentin (MCV) Antibody, C-reactive protein, Sedimentation rate  Plantar fasciitis of left foot: Under care of podiatry.  Improved.  Patient has tried cortisone injections, wearing a boot, and wearing orthotics in the past.  Not currently symptomatic.  Pain in both feet - She experiences an aching sensation and stiffness in both feet especially if standing for prolonged periods of time.  No tenderness or synovitis across MTP joints currently.  Both ankle joints have good range of motion  with no tenderness or joint swelling.  No evidence of Achilles tendinitis or plantar fasciitis.  Plan to obtain x-rays of both feet as a baseline.  Plan to obtain the following lab work for further evaluation.  Plan: Mutated Citrullinated Vimentin (MCV) Antibody, XR Foot 2 Views Left, XR Foot 2 Views Right, C-reactive protein, Sedimentation rate  Positive ANA (antinuclear antibody) - 04/14/24: CRP<3,  ANA 1:320NN, CCP<16, RF<10, vitamin D  28: She has no clinical features of systemic lupus at this time.  No malar rash.  No oral or nasal ulcerations.  She has intermittent sicca symptoms.  Patient reports intermittent symptoms of Raynaud's phenomenon for many years.  No signs of sclerodactyly or digital ulcerations noted today. Plan to obtain the following lab work today for further evaluation.- Plan: Mutated Citrullinated Vimentin (MCV) Antibody, Anti-scleroderma antibody, RNP Antibody, Sjogrens syndrome-A extractable nuclear antibody, Sjogrens syndrome-B extractable nuclear antibody, Anti-Smith antibody, Anti-DNA antibody, double-stranded, C3 and C4, Protein / creatinine ratio, urine  Other fatigue -Plan to obtain the following lab work for further evaluation.  Plan: CK, TSH  Other medical conditions are listed as follows:   Vitamin D  deficiency  Obstructive sleep apnea - CPAP  Lumbar radiculopathy  Prediabetes  MDD (major depressive disorder), recurrent, in full remission  History of hyperlipidemia  GAD (generalized anxiety disorder)  Orders: Orders Placed This Encounter  Procedures   XR Foot 2 Views Left   XR Foot 2 Views Right   Mutated Citrullinated Vimentin (MCV) Antibody   Anti-scleroderma antibody   RNP Antibody   Sjogrens syndrome-A extractable nuclear antibody   Sjogrens syndrome-B extractable nuclear antibody   Anti-Smith antibody   Anti-DNA antibody, double-stranded   C3 and C4   C-reactive protein   Sedimentation rate   CK   Protein / creatinine ratio, urine   TSH    No orders of the defined types were placed in this encounter.     Follow-Up Instructions: No follow-ups on file.   Waddell CHRISTELLA Craze, PA-C  Note - This record has been created using Dragon software.  Chart creation errors have been sought, but may not always  have been located. Such creation errors do not reflect on  the standard of medical care.      [1]  Social History Tobacco Use   Smoking status: Some Days    Current packs/day: 0.05    Average packs/day: 0.2 packs/day for 16.1 years (4.0 ttl pk-yrs)    Types: Cigarettes    Start date: 10/03/2023    Last attempt to quit: 10/22/2023    Passive exposure: Never   Smokeless tobacco: Never   Tobacco comments:    Patient explains that she has been smoking one cigarette here and there since 06/01/2024  Vaping Use   Vaping status: Never Used  Substance Use Topics   Alcohol use: No   Drug use: Yes    Types: Marijuana    Comment: prior microdosing of mushrooms twice weekly stopped as of 09/11/23. Marijuana: current use of CBD/THC   "

## 2024-07-03 ENCOUNTER — Ambulatory Visit: Admitting: Physician Assistant

## 2024-07-03 ENCOUNTER — Encounter: Payer: Self-pay | Admitting: Physician Assistant

## 2024-07-03 ENCOUNTER — Ambulatory Visit

## 2024-07-03 VITALS — BP 156/95 | HR 80 | Temp 98.2°F | Resp 14 | Ht 61.5 in | Wt 176.0 lb

## 2024-07-03 DIAGNOSIS — R7303 Prediabetes: Secondary | ICD-10-CM | POA: Insufficient documentation

## 2024-07-03 DIAGNOSIS — G4733 Obstructive sleep apnea (adult) (pediatric): Secondary | ICD-10-CM | POA: Insufficient documentation

## 2024-07-03 DIAGNOSIS — M79672 Pain in left foot: Secondary | ICD-10-CM | POA: Insufficient documentation

## 2024-07-03 DIAGNOSIS — M5416 Radiculopathy, lumbar region: Secondary | ICD-10-CM | POA: Diagnosis not present

## 2024-07-03 DIAGNOSIS — F17200 Nicotine dependence, unspecified, uncomplicated: Secondary | ICD-10-CM

## 2024-07-03 DIAGNOSIS — R7689 Other specified abnormal immunological findings in serum: Secondary | ICD-10-CM | POA: Diagnosis not present

## 2024-07-03 DIAGNOSIS — Z8639 Personal history of other endocrine, nutritional and metabolic disease: Secondary | ICD-10-CM | POA: Insufficient documentation

## 2024-07-03 DIAGNOSIS — M79642 Pain in left hand: Secondary | ICD-10-CM | POA: Insufficient documentation

## 2024-07-03 DIAGNOSIS — F411 Generalized anxiety disorder: Secondary | ICD-10-CM | POA: Insufficient documentation

## 2024-07-03 DIAGNOSIS — M79671 Pain in right foot: Secondary | ICD-10-CM | POA: Insufficient documentation

## 2024-07-03 DIAGNOSIS — M2560 Stiffness of unspecified joint, not elsewhere classified: Secondary | ICD-10-CM | POA: Insufficient documentation

## 2024-07-03 DIAGNOSIS — E559 Vitamin D deficiency, unspecified: Secondary | ICD-10-CM | POA: Diagnosis not present

## 2024-07-03 DIAGNOSIS — M722 Plantar fascial fibromatosis: Secondary | ICD-10-CM | POA: Diagnosis not present

## 2024-07-03 DIAGNOSIS — M79641 Pain in right hand: Secondary | ICD-10-CM | POA: Diagnosis not present

## 2024-07-03 DIAGNOSIS — R5383 Other fatigue: Secondary | ICD-10-CM | POA: Insufficient documentation

## 2024-07-03 DIAGNOSIS — F3342 Major depressive disorder, recurrent, in full remission: Secondary | ICD-10-CM | POA: Diagnosis not present

## 2024-07-07 ENCOUNTER — Ambulatory Visit: Payer: Self-pay | Admitting: Physician Assistant

## 2024-07-07 ENCOUNTER — Ambulatory Visit: Admitting: Family Medicine

## 2024-07-07 ENCOUNTER — Ambulatory Visit: Admitting: Physician Assistant

## 2024-07-07 DIAGNOSIS — G4733 Obstructive sleep apnea (adult) (pediatric): Secondary | ICD-10-CM

## 2024-07-07 DIAGNOSIS — M2560 Stiffness of unspecified joint, not elsewhere classified: Secondary | ICD-10-CM

## 2024-07-07 DIAGNOSIS — F17200 Nicotine dependence, unspecified, uncomplicated: Secondary | ICD-10-CM

## 2024-07-07 DIAGNOSIS — F3342 Major depressive disorder, recurrent, in full remission: Secondary | ICD-10-CM

## 2024-07-07 DIAGNOSIS — F4310 Post-traumatic stress disorder, unspecified: Secondary | ICD-10-CM

## 2024-07-07 DIAGNOSIS — R7689 Other specified abnormal immunological findings in serum: Secondary | ICD-10-CM

## 2024-07-07 DIAGNOSIS — M5416 Radiculopathy, lumbar region: Secondary | ICD-10-CM

## 2024-07-07 DIAGNOSIS — Z8639 Personal history of other endocrine, nutritional and metabolic disease: Secondary | ICD-10-CM

## 2024-07-07 DIAGNOSIS — F411 Generalized anxiety disorder: Secondary | ICD-10-CM

## 2024-07-07 DIAGNOSIS — M79641 Pain in right hand: Secondary | ICD-10-CM

## 2024-07-07 DIAGNOSIS — R7303 Prediabetes: Secondary | ICD-10-CM

## 2024-07-07 DIAGNOSIS — E559 Vitamin D deficiency, unspecified: Secondary | ICD-10-CM

## 2024-07-07 LAB — PROTEIN / CREATININE RATIO, URINE
Creatinine, Urine: 128 mg/dL (ref 20–275)
Protein/Creat Ratio: 70 mg/g{creat} (ref 24–184)
Protein/Creatinine Ratio: 0.07 mg/mg{creat} (ref 0.024–0.184)
Total Protein, Urine: 9 mg/dL (ref 5–24)

## 2024-07-07 LAB — ANTI-SCLERODERMA ANTIBODY: Scleroderma (Scl-70) (ENA) Antibody, IgG: <1 AI

## 2024-07-07 LAB — ANTI-SMITH ANTIBODY: ENA SM Ab Ser-aCnc: <1 AI

## 2024-07-07 LAB — SJOGRENS SYNDROME-B EXTRACTABLE NUCLEAR ANTIBODY: SSB (La) (ENA) Antibody, IgG: <1 AI

## 2024-07-07 LAB — C3 AND C4
C3 Complement: 137 mg/dL (ref 83–193)
C4 Complement: 40 mg/dL (ref 15–57)

## 2024-07-07 LAB — CK: Total CK: 121 U/L (ref 20–239)

## 2024-07-07 LAB — MUTATED CITRULLINATED VIMENTIN (MCV) ANTIBODY: MUTATED CITRULLINATED VIMENTIN (MCV) AB: <20 U/mL

## 2024-07-07 LAB — SJOGRENS SYNDROME-A EXTRACTABLE NUCLEAR ANTIBODY: SSA (Ro) (ENA) Antibody, IgG: <1 AI

## 2024-07-07 LAB — C-REACTIVE PROTEIN: CRP: <3 mg/L

## 2024-07-07 LAB — TSH: TSH: 1.17 m[IU]/L

## 2024-07-07 LAB — SEDIMENTATION RATE: Sed Rate: 6 mm/h (ref 0–20)

## 2024-07-07 LAB — RNP ANTIBODY: Ribonucleic Protein(ENA) Antibody, IgG: 1.6 AI — AB

## 2024-07-07 LAB — ANTI-DNA ANTIBODY, DOUBLE-STRANDED: ds DNA Ab: <1 [IU]/mL

## 2024-07-07 NOTE — Progress Notes (Signed)
 Plan to discuss results in detail at [facility]

## 2024-07-14 ENCOUNTER — Institutional Professional Consult (permissible substitution) (INDEPENDENT_AMBULATORY_CARE_PROVIDER_SITE_OTHER): Admitting: Nurse Practitioner

## 2024-07-15 ENCOUNTER — Ambulatory Visit: Admitting: Family Medicine

## 2024-07-22 ENCOUNTER — Telehealth (HOSPITAL_COMMUNITY): Admitting: Student in an Organized Health Care Education/Training Program

## 2024-07-31 ENCOUNTER — Institutional Professional Consult (permissible substitution) (INDEPENDENT_AMBULATORY_CARE_PROVIDER_SITE_OTHER): Admitting: Nurse Practitioner

## 2024-08-22 ENCOUNTER — Ambulatory Visit: Admitting: Physician Assistant

## 2024-08-28 ENCOUNTER — Ambulatory Visit: Admitting: Family Medicine

## 2025-04-06 ENCOUNTER — Ambulatory Visit: Admitting: Family Medicine
